# Patient Record
Sex: Male | Born: 1950 | Race: White | Hispanic: No | Marital: Single | Smoking: Never smoker
Health system: Southern US, Community
[De-identification: ages and names within clinical notes are randomized; demographics above are authoritative.]

## PROBLEM LIST (undated history)

## (undated) DIAGNOSIS — E042 Nontoxic multinodular goiter: Secondary | ICD-10-CM

## (undated) DIAGNOSIS — J302 Other seasonal allergic rhinitis: Secondary | ICD-10-CM

## (undated) DIAGNOSIS — E119 Type 2 diabetes mellitus without complications: Secondary | ICD-10-CM

## (undated) DIAGNOSIS — I499 Cardiac arrhythmia, unspecified: Secondary | ICD-10-CM

## (undated) DIAGNOSIS — M1990 Unspecified osteoarthritis, unspecified site: Secondary | ICD-10-CM

## (undated) DIAGNOSIS — I10 Essential (primary) hypertension: Secondary | ICD-10-CM

## (undated) DIAGNOSIS — Z87442 Personal history of urinary calculi: Secondary | ICD-10-CM

## (undated) DIAGNOSIS — G4730 Sleep apnea, unspecified: Secondary | ICD-10-CM

## (undated) DIAGNOSIS — K219 Gastro-esophageal reflux disease without esophagitis: Secondary | ICD-10-CM

## (undated) HISTORY — PX: OTHER SURGICAL HISTORY: SHX169

## (undated) HISTORY — DX: Other seasonal allergic rhinitis: J30.2

## (undated) HISTORY — DX: Sleep apnea, unspecified: G47.30

## (undated) HISTORY — DX: Type 2 diabetes mellitus without complications: E11.9

## (undated) HISTORY — PX: RHINOPHYMA RESECTION: SHX2353

---

## 2013-07-01 ENCOUNTER — Ambulatory Visit: Payer: Self-pay | Admitting: Internal Medicine

## 2014-01-20 DIAGNOSIS — J018 Other acute sinusitis: Secondary | ICD-10-CM | POA: Diagnosis not present

## 2014-01-20 DIAGNOSIS — L309 Dermatitis, unspecified: Secondary | ICD-10-CM | POA: Diagnosis not present

## 2014-01-20 DIAGNOSIS — Z23 Encounter for immunization: Secondary | ICD-10-CM | POA: Diagnosis not present

## 2014-01-20 DIAGNOSIS — R05 Cough: Secondary | ICD-10-CM | POA: Diagnosis not present

## 2014-02-01 ENCOUNTER — Telehealth: Payer: Self-pay | Admitting: Family Medicine

## 2014-02-01 NOTE — Telephone Encounter (Signed)
Pt given new pt appt with dr Livia Snellen 2/17 at 7:55, pt aware to arrive 15 minutes early, to bring insurance card and a list of all current medications.

## 2014-02-24 ENCOUNTER — Encounter: Payer: Self-pay | Admitting: Family Medicine

## 2014-02-24 ENCOUNTER — Ambulatory Visit (INDEPENDENT_AMBULATORY_CARE_PROVIDER_SITE_OTHER): Payer: Medicare Other | Admitting: Family Medicine

## 2014-02-24 ENCOUNTER — Encounter (INDEPENDENT_AMBULATORY_CARE_PROVIDER_SITE_OTHER): Payer: Self-pay

## 2014-02-24 VITALS — BP 147/89 | HR 68 | Temp 97.2°F | Ht 71.5 in | Wt 218.0 lb

## 2014-02-24 DIAGNOSIS — R5383 Other fatigue: Secondary | ICD-10-CM

## 2014-02-24 DIAGNOSIS — E119 Type 2 diabetes mellitus without complications: Secondary | ICD-10-CM | POA: Diagnosis not present

## 2014-02-24 DIAGNOSIS — J3081 Allergic rhinitis due to animal (cat) (dog) hair and dander: Secondary | ICD-10-CM | POA: Diagnosis not present

## 2014-02-24 DIAGNOSIS — M5432 Sciatica, left side: Secondary | ICD-10-CM

## 2014-02-24 DIAGNOSIS — G4733 Obstructive sleep apnea (adult) (pediatric): Secondary | ICD-10-CM | POA: Diagnosis not present

## 2014-02-24 LAB — POCT GLYCOSYLATED HEMOGLOBIN (HGB A1C)

## 2014-02-24 MED ORDER — BETAMETHASONE DIPROPIONATE AUG 0.05 % EX CREA: TOPICAL_CREAM | Freq: Two times a day (BID) | CUTANEOUS | Status: DC

## 2014-02-24 MED ORDER — GABAPENTIN 400 MG PO CAPS: 400.0000 mg | ORAL_CAPSULE | Freq: Three times a day (TID) | ORAL | Status: DC

## 2014-02-24 MED ORDER — DICLOFENAC SODIUM 75 MG PO TBEC: 75.0000 mg | DELAYED_RELEASE_TABLET | Freq: Two times a day (BID) | ORAL | Status: DC

## 2014-02-24 MED ORDER — SILDENAFIL CITRATE 20 MG PO TABS: 20.0000 mg | ORAL_TABLET | Freq: Every day | ORAL | Status: DC | PRN

## 2014-02-24 NOTE — Progress Notes (Signed)
Subjective:  Patient ID: Garrett Mcknight, male    DOB: [birth year]  Age: 64 y.o. MRN: [id number]  CC: Establish Care and Diabetes   HPI Garrett Mcknight presents for patients in today first visit since his move from [place] a little over year ago. He does still have family there and up until now has been seeing his doctor there. His record was reviewed before the visit showing his A1c to be 6.7. He does have a history of a left knee replacement and lumbar laminectomy. He uses CPAP for sleep apnea. He stated that he is has trouble sleeping because the CPAP leaks at times. He also has trouble sleeping due to the pain from his knees. With regard to the diabetes he tells me his blood sugar tends to run about 120-135 or so fasting in the morning however after meals later in the day it is a bit lower. 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 The sleeve reduces absorption of food and allowed him to lose over 75 pounds from the time it was placed the weight loss was over 5 years drinking 8 months of that he had the sleeve. The sleeve was removed due to complications [place] but through the study of hepatic infections.  History Garrett Mcknight has no past medical history on file.   He has no past surgical history on file.   His family history is not on file.He reports that he has never smoked. He does not have any smokeless tobacco history on file. He reports that he drinks alcohol. He reports that he does not use illicit drugs.  No current outpatient prescriptions on file prior to visit.   No current facility-administered medications on file prior to visit.    ROS Review of Systems  Constitutional: Negative for fever, chills, diaphoresis and unexpected weight change.  HENT: Negative for congestion, hearing loss, rhinorrhea, sore throat and trouble swallowing.   Respiratory:  Negative for cough, chest tightness, shortness of breath and wheezing.   Gastrointestinal: Negative for nausea, vomiting, abdominal pain, diarrhea, constipation and abdominal distention.  Endocrine: Negative for cold intolerance and heat intolerance.  Genitourinary: Negative for dysuria, hematuria and flank pain.  Musculoskeletal: Negative for joint swelling and arthralgias.  Skin: Negative for rash.  Neurological: Negative for dizziness and headaches.  Psychiatric/Behavioral: Negative for dysphoric mood, decreased concentration and agitation. The patient is not nervous/anxious.     Objective:  BP 147/89 mmHg  Pulse 68  Temp(Src) 97.2 F (36.2 C) (Oral)  Ht 5' 11.5" (1.816 m)  Wt 218 lb (98.884 kg)  BMI 29.98 kg/m2  Physical Exam  Constitutional: He is oriented to person, place, and time. He appears well-developed and well-nourished. No distress.  HENT:  Head: Normocephalic and atraumatic.  Right Ear: External ear normal.  Left Ear: External ear normal.  Nose: Nose normal.  Mouth/Throat: Oropharynx is clear and moist.  Eyes: Conjunctivae and EOM are normal. Pupils are equal, round, and reactive to light.  Neck: Normal range of motion. Neck supple. No thyromegaly present.  Cardiovascular: Normal rate, regular rhythm and normal heart sounds.   No murmur heard. Pulmonary/Chest: Effort normal and breath sounds normal. No respiratory distress. He has no wheezes. He has no rales.  Abdominal: Soft. Bowel sounds are normal. He exhibits no distension. There is no tenderness.  Lymphadenopathy:    He has no cervical adenopathy.  Neurological: He  is alert and oriented to person, place, and time. He has normal reflexes.  Skin: Skin is warm and dry.  Psychiatric: He has a normal mood and affect. His behavior is normal. Judgment and thought content normal.    Assessment & Plan:   Garrett Mcknight was seen today for establish care and diabetes.  Diagnoses and all orders for this visit:  Diabetes  mellitus type 2, controlled, without complications Orders: -     POCT glycosylated hemoglobin (Hb A1C) -     NMR, lipoprofile -     CMP14+EGFR  Other fatigue Orders: -     CBC with Differential/Platelet  Obstructive sleep apnea Orders: -     Ambulatory referral to Sleep Studies  Sciatica, left Orders: -     Ambulatory referral to Orthopedic Surgery  Allergic rhinitis due to animal hair and dander  Other orders -     diclofenac (VOLTAREN) 75 MG EC tablet; Take 1 tablet (75 mg total) by mouth 2 (two) times daily. -     sildenafil (REVATIO) 20 MG tablet; Take 1 tablet (20 mg total) by mouth daily as needed (2-5 as needed). -     gabapentin (NEURONTIN) 400 MG capsule; Take 1 capsule (400 mg total) by mouth 3 (three) times daily. -     augmented betamethasone dipropionate (DIPROLENE-AF) 0.05 % cream; Apply topically 2 (two) times daily.   I have changed Garrett Mcknight gabapentin. I am also having him start on diclofenac, sildenafil, and augmented betamethasone dipropionate. Additionally, I am having him maintain his metFORMIN, lisinopril, and fluticasone.  Meds ordered this encounter  Medications  . metFORMIN (GLUCOPHAGE) 1000 MG tablet    Sig: Take 1,000 mg by mouth 2 (two) times daily with a meal.  . lisinopril (PRINIVIL,ZESTRIL) 10 MG tablet    Sig: Take 10 mg by mouth daily.  Marland Kitchen DISCONTD: gabapentin (NEURONTIN) 300 MG capsule    Sig: Take 300 mg by mouth 3 (three) times daily.  . fluticasone (VERAMYST) 27.5 MCG/SPRAY nasal spray    Sig: Place 2 sprays into the nose daily.  . diclofenac (VOLTAREN) 75 MG EC tablet    Sig: Take 1 tablet (75 mg total) by mouth 2 (two) times daily.    Dispense:  60 tablet    Refill:  2  . sildenafil (REVATIO) 20 MG tablet    Sig: Take 1 tablet (20 mg total) by mouth daily as needed (2-5 as needed).    Dispense:  50 tablet    Refill:  5  . gabapentin (NEURONTIN) 400 MG capsule    Sig: Take 1 capsule (400 mg total) by mouth 3 (three) times  daily.    Dispense:  90 capsule    Refill:  2  . augmented betamethasone dipropionate (DIPROLENE-AF) 0.05 % cream    Sig: Apply topically 2 (two) times daily.    Dispense:  30 g    Refill:  0    Follow-up: Return in about 3 months (around 05/25/2014), or if symptoms worsen or fail to improve, for diabetes, CPE.  [name], M.D.

## 2014-02-25 LAB — NMR, LIPOPROFILE
CHOLESTEROL: 137 mg/dL (ref 100–199)
HDL Cholesterol by NMR: 51 mg/dL (ref >39)
HDL Particle Number: 32.6 umol/L (ref >=30.5)
LDL Particle Number: 666 nmol/L (ref <1000)
LDL SIZE: 21.3 nm (ref >20.5)
LDL-C: 77 mg/dL (ref 0–99)
LP-IR Score: 32 (ref <=45)
SMALL LDL PARTICLE NUMBER: 178 nmol/L (ref <=527)
TRIGLYCERIDES BY NMR: 47 mg/dL (ref 0–149)

## 2014-02-25 LAB — CMP14+EGFR
ALT: 15 IU/L (ref 0–44)
AST: 15 IU/L (ref 0–40)
Albumin/Globulin Ratio: 1.9 (ref 1.1–2.5)
Albumin: 4.4 g/dL (ref 3.6–4.8)
Alkaline Phosphatase: 58 IU/L (ref 39–117)
BUN/Creatinine Ratio: 25 — ABNORMAL HIGH (ref 10–22)
BUN: 17 mg/dL (ref 8–27)
Bilirubin Total: 0.5 mg/dL (ref 0.0–1.2)
CALCIUM: 9.8 mg/dL (ref 8.6–10.2)
CO2: 25 mmol/L (ref 18–29)
CREATININE: 0.68 mg/dL — AB (ref 0.76–1.27)
Chloride: 102 mmol/L (ref 97–108)
GFR calc Af Amer: 116 mL/min/{1.73_m2} (ref >59)
GFR, EST NON AFRICAN AMERICAN: 100 mL/min/{1.73_m2} (ref >59)
GLUCOSE: 131 mg/dL — AB (ref 65–99)
Globulin, Total: 2.3 g/dL (ref 1.5–4.5)
Potassium: 4.5 mmol/L (ref 3.5–5.2)
Sodium: 141 mmol/L (ref 134–144)
TOTAL PROTEIN: 6.7 g/dL (ref 6.0–8.5)

## 2014-03-05 ENCOUNTER — Other Ambulatory Visit: Payer: Self-pay | Admitting: *Deleted

## 2014-03-05 MED ORDER — METFORMIN HCL 1000 MG PO TABS: 1000.0000 mg | ORAL_TABLET | Freq: Two times a day (BID) | ORAL | Status: DC

## 2014-03-05 NOTE — Progress Notes (Signed)
Pt was to supposed to get Metformin refill at [place] sent into pharmacy per Dr Livia Snellen

## 2014-03-18 DIAGNOSIS — M1711 Unilateral primary osteoarthritis, right knee: Secondary | ICD-10-CM | POA: Diagnosis not present

## 2014-04-29 ENCOUNTER — Encounter: Payer: Self-pay | Admitting: Internal Medicine

## 2014-04-29 ENCOUNTER — Ambulatory Visit (INDEPENDENT_AMBULATORY_CARE_PROVIDER_SITE_OTHER): Payer: Medicare Other | Admitting: Internal Medicine

## 2014-04-29 VITALS — BP 124/70 | HR 66 | Ht 72.0 in | Wt 219.4 lb

## 2014-04-29 DIAGNOSIS — G4733 Obstructive sleep apnea (adult) (pediatric): Secondary | ICD-10-CM

## 2014-04-29 NOTE — Progress Notes (Signed)
04/29/14- 42 yoM never smoker  Referred courtesy of  Dr [name]- Pt uses CPAP 8 hrs nightly. C/o waking up with sinus congestion. Pt has loss weight and seen improvement with OSA. Prior NPSG in [place], [place].  Needs reassessment.  Original diagnosis made when he noted fatigue and night time sweats on awakening. Not used CPAP in months and says he no longer notes EDS.  Sleep is disturbed by knee and back pains. Pending knee replacement . Has dieted off 40 lbs since peak. Seasonal allergic rhinitis. Remote rhinoplasty for repeated nasal fxs.  Prior to Admission medications   Medication Sig Start Date End Date Taking? Authorizing Provider  fluticasone (VERAMYST) 27.5 MCG/SPRAY nasal spray Place 2 sprays into the nose daily.   Yes Historical Provider, MD  gabapentin (NEURONTIN) 400 MG capsule Take 1 capsule (400 mg total) by mouth 3 (three) times daily. 02/24/14  Yes [name], MD  lisinopril (PRINIVIL,ZESTRIL) 10 MG tablet Take 10 mg by mouth daily.   Yes Historical Provider, MD  metFORMIN (GLUCOPHAGE) 1000 MG tablet Take 1 tablet (1,000 mg total) by mouth 2 (two) times daily with a meal. 03/05/14  Yes [name], MD  diclofenac (VOLTAREN) 75 MG EC tablet Take 1 tablet (75 mg total) by mouth 2 (two) times daily. Patient not taking: Reported on 04/29/2014 02/24/14   [name], MD  sildenafil (REVATIO) 20 MG tablet Take 1 tablet (20 mg total) by mouth daily as needed (2-5 as needed). Patient not taking: Reported on 04/29/2014 02/24/14   [name], MD   Past Medical History  Diagnosis Date  . Sleep apnea   . Diabetes   . Seasonal allergies    Past Surgical History  Procedure Laterality Date  . L5 lumbar surgery    . Rhinophyma resection    . Right knee meniscus    . Right wrist reconstruction    . Left knee acl repaired    . Left knee replacement    . Left bicep tendoesis    . Left rotary cuff repair     ROS-see HPI   Negative unless "+" Constitutional:    weight loss,  night sweats, fevers, chills, fatigue, lassitude. HEENT:    headaches, difficulty swallowing, tooth/dental problems, sore throat,       sneezing, itching, ear ache, nasal congestion, post nasal drip, snoring CV:    chest pain, orthopnea, PND, swelling in lower extremities, anasarca,                                  dizziness, palpitations Resp:   shortness of breath with exertion or at rest.                productive cough,   non-productive cough, coughing up of blood.              change in color of mucus.  wheezing.   Skin:    rash or lesions. GI:  No-   heartburn, indigestion, abdominal pain, nausea, vomiting, diarrhea,                 change in bowel habits, loss of appetite GU: dysuria, change in color of urine, no urgency or frequency.   flank pain. MS:   +joint pain, stiffness, decreased range of motion, back pain. Neuro-     nothing unusual Psych:  change in mood or affect.  depression or anxiety.   memory loss.  OBJ- Physical Exam General- Alert, Oriented, Affect-appropriate, Distress- none acute Skin- rash-none, lesions- none, excoriation- none Lymphadenopathy- none Head- atraumatic            Eyes- Gross vision intact, PERRLA, conjunctivae and secretions clear            Ears- Hearing, canals-normal            Nose- Clear, no-Septal dev, mucus, polyps, erosion, perforation             Throat- Mallampati II-III , mucosa clear , drainage- none, tonsils- atrophic Neck- flexible , trachea midline, no stridor , thyroid nl, carotid no bruit Chest - symmetrical excursion , unlabored           Heart/CV- RRR , no murmur , no gallop  , no rub, nl s1 s2                           - JVD- none , edema- none, stasis changes- none, varices- none           Lung- clear to P&A, wheeze- none, cough- none , dullness-none, rub- none           Chest wall-  Abd-  Br/ Gen/ Rectal- Not done, not indicated Extrem- cyanosis- none, clubbing, none, atrophy- none, strength- nl Neuro- grossly intact to  observation

## 2014-04-29 NOTE — Patient Instructions (Addendum)
Order- Schedule split protocol NPSG    Dx OSA Educated sleep hygiene, management of rhinitis and insomnia related to knee pain.

## 2014-04-29 NOTE — Assessment & Plan Note (Addendum)
We are seeking result of original sleep study from [place]. Hard to tell if he still has OSA after weight loss. Areas of concern include his diabetes - OSA makes control worse, Plan- NPSG with discussion

## 2014-05-13 ENCOUNTER — Telehealth: Payer: Self-pay | Admitting: Family Medicine

## 2014-05-13 MED ORDER — PREGABALIN 150 MG PO CAPS: 150.0000 mg | ORAL_CAPSULE | Freq: Two times a day (BID) | ORAL | Status: DC

## 2014-05-13 NOTE — Telephone Encounter (Signed)
Pt aware new prescription has been sent to pharmacy.  Pt had another question about his medications. Should he stay on lisinopril while taking the sildenefil, If he is to continue taking both he needs a 90d supply sent to the [place]

## 2014-05-13 NOTE — Telephone Encounter (Signed)
Yes, please send both with 6 month refills

## 2014-05-13 NOTE — Telephone Encounter (Signed)
Pt was at front desk wanting to talk with nurse, he told the front office that he is concerned with his weight gain on gabapentin and is wanting to know about changing it

## 2014-05-13 NOTE — Telephone Encounter (Signed)
He should be able to make a direct switch over to Lyrica 150 twice a day without tapering or titrating. I printed a prescription. It is much less likely to cause weight gain.

## 2014-05-14 MED ORDER — SILDENAFIL CITRATE 20 MG PO TABS: 20.0000 mg | ORAL_TABLET | Freq: Every day | ORAL | Status: DC | PRN

## 2014-05-14 MED ORDER — LISINOPRIL 10 MG PO TABS: 10.0000 mg | ORAL_TABLET | Freq: Every day | ORAL | Status: DC

## 2014-05-14 NOTE — Telephone Encounter (Signed)
Meds sent in

## 2014-05-18 ENCOUNTER — Telehealth: Payer: Self-pay

## 2014-05-18 NOTE — Telephone Encounter (Signed)
Dr Livia Snellen I am having to prior authorize Sildenafil for this patient  Looking in your notes I do not see a Diagnosis for this medicaiton   Please advise?

## 2014-05-18 NOTE — Telephone Encounter (Signed)
Use erectile dysfunction.

## 2014-05-20 ENCOUNTER — Telehealth: Payer: Self-pay

## 2014-05-20 NOTE — Telephone Encounter (Signed)
Dr Livia Snellen  Having to get Sildenafil authorized and you gave me the DX of Erectile Dysfunction but I'm not understanding the amount of #90 take one daily as needed?  Is he just to use during relations and if so #90 is too many to write for to get approved.   Let me know

## 2014-05-20 NOTE — Progress Notes (Signed)
Surgery on 06/01/2014.  preop on 05/26/14 at 200pm.  Need orders in EPIC.  Thank You.

## 2014-05-21 ENCOUNTER — Other Ambulatory Visit: Payer: Self-pay | Admitting: Surgical

## 2014-05-21 MED ORDER — SILDENAFIL CITRATE 20 MG PO TABS: ORAL_TABLET | ORAL | Status: DC

## 2014-05-21 NOTE — Progress Notes (Signed)
Surgery on 06/01/2014.  Preop on 5/18 at 200pm.  Need ordersin EPIC Thank You.

## 2014-05-21 NOTE — Telephone Encounter (Signed)
I sent a new prescription to that should be more specific thanks.

## 2014-05-25 ENCOUNTER — Other Ambulatory Visit (HOSPITAL_COMMUNITY): Payer: Self-pay | Admitting: Orthopedic Surgery

## 2014-05-25 NOTE — Patient Instructions (Addendum)
Garrett Mcknight  05/25/2014   Your procedure is scheduled on: Tuesday 06/01/2014  Report to [facility] Main  Entrance and follow signs to               [place] at  1015 AM.  Call this number if you have problems the morning of surgery [phone]   Remember: ONLY 1 PERSON MAY GO WITH YOU TO SHORT STAY TO GET  READY MORNING OF [place].  Do not eat food or drink liquids :After Midnight.     Take these medicines the morning of surgery with A SIP OF WATER:  use Veramyst nasal spray                               You may not have any metal on your body including hair pins and              piercings  Do not wear jewelry, make-up, lotions, powders or perfumes, deodorant             Do not wear nail polish.  Do not shave  48 hours prior to surgery.              Men may shave face and neck.   Do not bring valuables to the hospital. [place].  Contacts, dentures or bridgework may not be worn into surgery.  Leave suitcase in the car. After surgery it may be brought to your room.     Patients discharged the day of surgery will not be allowed to drive home.  Name and phone number of your driver:  Special Instructions: N/A              Please read over the following fact sheets you were given: _____________________________________________________________________             [facility] - Preparing for Surgery Before surgery, you can play an important role.  Because skin is not sterile, your skin needs to be as free of germs as possible.  You can reduce the number of germs on your skin by washing with CHG (chlorahexidine gluconate) soap before surgery.  CHG is an antiseptic cleaner which kills germs and bonds with the skin to continue killing germs even after washing. Please DO NOT use if you have an allergy to CHG or antibacterial soaps.  If your skin becomes reddened/irritated stop using the CHG and  inform your nurse when you arrive at Short Stay. Do not shave (including legs and underarms) for at least 48 hours prior to the first CHG shower.  You may shave your face/neck. Please follow these instructions carefully:  1.  Shower with CHG Soap the night before surgery and the  morning of Surgery.  2.  If you choose to wash your hair, wash your hair first as usual with your  normal  shampoo.  3.  After you shampoo, rinse your hair and body thoroughly to remove the  shampoo.                           4.  Use CHG as you would any other liquid soap.  You can apply chg directly  to the skin and wash  Gently with a scrungie or clean washcloth.  5.  Apply the CHG Soap to your body ONLY FROM THE NECK DOWN.   Do not use on face/ open                           Wound or open sores. Avoid contact with eyes, ears mouth and genitals (private parts).                       Wash face,  Genitals (private parts) with your normal soap.             6.  Wash thoroughly, paying special attention to the area where your surgery  will be performed.  7.  Thoroughly rinse your body with warm water from the neck down.  8.  DO NOT shower/wash with your normal soap after using and rinsing off  the CHG Soap.                9.  Pat yourself dry with a clean towel.            10.  Wear clean pajamas.            11.  Place clean sheets on your bed the night of your first shower and do not  sleep with pets. Day of Surgery : Do not apply any lotions/deodorants the morning of surgery.  Please wear clean clothes to the hospital/surgery center.  FAILURE TO FOLLOW THESE INSTRUCTIONS MAY RESULT IN THE CANCELLATION OF YOUR SURGERY PATIENT SIGNATURE_________________________________  NURSE SIGNATURE__________________________________  ________________________________________________________________________   Garrett Mcknight  An incentive spirometer is a tool that can help keep your lungs clear and  active. This tool measures how well you are filling your lungs with each breath. Taking long deep breaths may help reverse or decrease the chance of developing breathing (pulmonary) problems (especially infection) following:  A long period of time when you are unable to move or be active. BEFORE THE PROCEDURE   If the spirometer includes an indicator to show your best effort, your nurse or respiratory therapist will set it to a desired goal.  If possible, sit up straight or lean slightly forward. Try not to slouch.  Hold the incentive spirometer in an upright position. INSTRUCTIONS FOR USE   Sit on the edge of your bed if possible, or sit up as far as you can in bed or on a chair.  Hold the incentive spirometer in an upright position.  Breathe out normally.  Place the mouthpiece in your mouth and seal your lips tightly around it.  Breathe in slowly and as deeply as possible, raising the piston or the ball toward the top of the column.  Hold your breath for 3-5 seconds or for as long as possible. Allow the piston or ball to fall to the bottom of the column.  Remove the mouthpiece from your mouth and breathe out normally.  Rest for a few seconds and repeat Steps 1 through 7 at least 10 times every 1-2 hours when you are awake. Take your time and take a few normal breaths between deep breaths.  The spirometer may include an indicator to show your best effort. Use the indicator as a goal to work toward during each repetition.  After each set of 10 deep breaths, practice coughing to be sure your lungs are clear. If you have an incision (the cut made at the time of surgery),  support your incision when coughing by placing a pillow or rolled up towels firmly against it. Once you are able to get out of bed, walk around indoors and cough well. You may stop using the incentive spirometer when instructed by your caregiver.  RISKS AND COMPLICATIONS  Take your time so you do not get dizzy or  light-headed.  If you are in pain, you may need to take or ask for pain medication before doing incentive spirometry. It is harder to take a deep breath if you are having pain. AFTER USE  Rest and breathe slowly and easily.  It can be helpful to keep track of a log of your progress. Your caregiver can provide you with a simple table to help with this. If you are using the spirometer at home, follow these instructions: [place] IF:   You are having difficultly using the spirometer.  You have trouble using the spirometer as often as instructed.  Your pain medication is not giving enough relief while using the spirometer.  You develop fever of 100.5 F (38.1 C) or higher. SEEK IMMEDIATE MEDICAL CARE IF:   You cough up bloody sputum that had not been present before.  You develop fever of 102 F (38.9 C) or greater.  You develop worsening pain at or near the incision site. MAKE SURE YOU:   Understand these instructions.  Will watch your condition.  Will get help right away if you are not doing well or get worse. Document Released: 05/07/2006 Document Revised: 03/19/2011 Document Reviewed: 07/08/2006 Exit[employer] Patient Information 2014 [employer], [place].   ________________________________________________________________________  WHAT IS A BLOOD TRANSFUSION? Blood Transfusion Information  A transfusion is the replacement of blood or some of its parts. Blood is made up of multiple cells which provide different functions.  Red blood cells carry oxygen and are used for blood loss replacement.  White blood cells fight against infection.  Platelets control bleeding.  Plasma helps clot blood.  Other blood products are available for specialized needs, such as hemophilia or other clotting disorders. BEFORE THE TRANSFUSION  Who gives blood for transfusions?   Healthy volunteers who are fully evaluated to make sure their blood is safe. This is blood bank  blood. Transfusion therapy is the safest it has ever been in the practice of medicine. Before blood is taken from a donor, a complete history is taken to make sure that person has no history of diseases nor engages in risky social behavior (examples are intravenous drug use or sexual activity with multiple partners). The donor's travel history is screened to minimize risk of transmitting infections, such as malaria. The donated blood is tested for signs of infectious diseases, such as HIV and hepatitis. The blood is then tested to be sure it is compatible with you in order to minimize the chance of a transfusion reaction. If you or a relative donates blood, this is often done in anticipation of surgery and is not appropriate for emergency situations. It takes many days to process the donated blood. RISKS AND COMPLICATIONS Although transfusion therapy is very safe and saves many lives, the main dangers of transfusion include:   Getting an infectious disease.  Developing a transfusion reaction. This is an allergic reaction to something in the blood you were given. Every precaution is taken to prevent this. The decision to have a blood transfusion has been considered carefully by your caregiver before blood is given. Blood is not given unless the benefits outweigh the risks. AFTER THE TRANSFUSION  Right after receiving a blood transfusion, you will usually feel much better and more energetic. This is especially true if your red blood cells have gotten low (anemic). The transfusion raises the level of the red blood cells which carry oxygen, and this usually causes an energy increase.  The nurse administering the transfusion will monitor you carefully for complications. HOME CARE INSTRUCTIONS  No special instructions are needed after a transfusion. You may find your energy is better. Speak with your caregiver about any limitations on activity for underlying diseases you may have. SEEK MEDICAL CARE IF:    Your condition is not improving after your transfusion.  You develop redness or irritation at the intravenous (IV) site. SEEK IMMEDIATE MEDICAL CARE IF:  Any of the following symptoms occur over the next 12 hours:  Shaking chills.  You have a temperature by mouth above 102 F (38.9 C), not controlled by medicine.  Chest, back, or muscle pain.  People around you feel you are not acting correctly or are confused.  Shortness of breath or difficulty breathing.  Dizziness and fainting.  You get a rash or develop hives.  You have a decrease in urine output.  Your urine turns a dark color or changes to pink, red, or brown. Any of the following symptoms occur over the next 10 days:  You have a temperature by mouth above 102 F (38.9 C), not controlled by medicine.  Shortness of breath.  Weakness after normal activity.  The white part of the eye turns yellow (jaundice).  You have a decrease in the amount of urine or are urinating less often.  Your urine turns a dark color or changes to pink, red, or brown. Document Released: 12/23/1999 Document Revised: 03/19/2011 Document Reviewed: 08/11/2007 [facility] Patient Information 2014 [place], [place].  _______________________________________________________________________

## 2014-05-26 ENCOUNTER — Encounter (HOSPITAL_COMMUNITY)
Admission: RE | Admit: 2014-05-26 | Discharge: 2014-05-26 | Disposition: A | Discharge status: Home / Self Care | Payer: Medicare Other | Source: Ambulatory Visit | Attending: Orthopedic Surgery | Admitting: Orthopedic Surgery

## 2014-05-26 ENCOUNTER — Ambulatory Visit (HOSPITAL_COMMUNITY)
Admission: RE | Admit: 2014-05-26 | Discharge: 2014-05-26 | Disposition: A | Discharge status: Home / Self Care | Payer: Medicare Other | Source: Ambulatory Visit | Attending: Surgical | Admitting: Surgical

## 2014-05-26 ENCOUNTER — Telehealth: Payer: Self-pay

## 2014-05-26 ENCOUNTER — Encounter (HOSPITAL_COMMUNITY): Payer: Self-pay

## 2014-05-26 ENCOUNTER — Telehealth: Payer: Self-pay | Admitting: Internal Medicine

## 2014-05-26 DIAGNOSIS — M179 Osteoarthritis of knee, unspecified: Secondary | ICD-10-CM | POA: Diagnosis not present

## 2014-05-26 DIAGNOSIS — Z833 Family history of diabetes mellitus: Secondary | ICD-10-CM | POA: Diagnosis not present

## 2014-05-26 DIAGNOSIS — Z01818 Encounter for other preprocedural examination: Secondary | ICD-10-CM | POA: Diagnosis not present

## 2014-05-26 DIAGNOSIS — M25561 Pain in right knee: Secondary | ICD-10-CM | POA: Diagnosis not present

## 2014-05-26 DIAGNOSIS — Z01810 Encounter for preprocedural cardiovascular examination: Secondary | ICD-10-CM | POA: Insufficient documentation

## 2014-05-26 DIAGNOSIS — Z01812 Encounter for preprocedural laboratory examination: Secondary | ICD-10-CM | POA: Diagnosis not present

## 2014-05-26 DIAGNOSIS — M1711 Unilateral primary osteoarthritis, right knee: Secondary | ICD-10-CM | POA: Diagnosis present

## 2014-05-26 DIAGNOSIS — K219 Gastro-esophageal reflux disease without esophagitis: Secondary | ICD-10-CM | POA: Insufficient documentation

## 2014-05-26 DIAGNOSIS — Z79899 Other long term (current) drug therapy: Secondary | ICD-10-CM | POA: Diagnosis not present

## 2014-05-26 DIAGNOSIS — I10 Essential (primary) hypertension: Secondary | ICD-10-CM | POA: Diagnosis present

## 2014-05-26 DIAGNOSIS — G4733 Obstructive sleep apnea (adult) (pediatric): Secondary | ICD-10-CM

## 2014-05-26 DIAGNOSIS — E119 Type 2 diabetes mellitus without complications: Secondary | ICD-10-CM | POA: Diagnosis present

## 2014-05-26 DIAGNOSIS — M24561 Contracture, right knee: Secondary | ICD-10-CM | POA: Diagnosis not present

## 2014-05-26 DIAGNOSIS — S60812A Abrasion of left wrist, initial encounter: Secondary | ICD-10-CM | POA: Diagnosis not present

## 2014-05-26 DIAGNOSIS — Z96652 Presence of left artificial knee joint: Secondary | ICD-10-CM | POA: Diagnosis present

## 2014-05-26 HISTORY — DX: Gastro-esophageal reflux disease without esophagitis: K21.9

## 2014-05-26 HISTORY — DX: Essential (primary) hypertension: I10

## 2014-05-26 HISTORY — DX: Unspecified osteoarthritis, unspecified site: M19.90

## 2014-05-26 LAB — CBC WITH DIFFERENTIAL/PLATELET
Basophils Absolute: 0 10*3/uL (ref 0.0–0.1)
Basophils Relative: 0 % (ref 0–1)
Eosinophils Absolute: 0.2 10*3/uL (ref 0.0–0.7)
Eosinophils Relative: 3 % (ref 0–5)
HCT: 44.4 % (ref 39.0–52.0)
Hemoglobin: 15.1 g/dL (ref 13.0–17.0)
Lymphocytes Relative: 26 % (ref 12–46)
Lymphs Abs: 1.9 10*3/uL (ref 0.7–4.0)
MCH: 29.1 pg (ref 26.0–34.0)
MCHC: 34 g/dL (ref 30.0–36.0)
MCV: 85.5 fL (ref 78.0–100.0)
Monocytes Absolute: 0.6 10*3/uL (ref 0.1–1.0)
Monocytes Relative: 7 % (ref 3–12)
Neutro Abs: 4.8 10*3/uL (ref 1.7–7.7)
Neutrophils Relative %: 64 % (ref 43–77)
Platelets: 227 10*3/uL (ref 150–400)
RBC: 5.19 MIL/uL (ref 4.22–5.81)
RDW: 13.1 % (ref 11.5–15.5)
WBC: 7.6 10*3/uL (ref 4.0–10.5)

## 2014-05-26 LAB — COMPREHENSIVE METABOLIC PANEL
ALT: 21 U/L (ref 17–63)
AST: 30 U/L (ref 15–41)
Albumin: 4.5 g/dL (ref 3.5–5.0)
Alkaline Phosphatase: 55 U/L (ref 38–126)
Anion gap: 8 (ref 5–15)
BUN: 17 mg/dL (ref 6–20)
CO2: 28 mmol/L (ref 22–32)
Calcium: 9.6 mg/dL (ref 8.9–10.3)
Chloride: 106 mmol/L (ref 101–111)
Creatinine, Ser: 0.7 mg/dL (ref 0.61–1.24)
GFR calc Af Amer: >60 mL/min (ref >60)
GFR calc non Af Amer: >60 mL/min (ref >60)
Glucose, Bld: 109 mg/dL — ABNORMAL HIGH (ref 65–99)
Potassium: 4.5 mmol/L (ref 3.5–5.1)
Sodium: 142 mmol/L (ref 135–145)
Total Bilirubin: 0.8 mg/dL (ref 0.3–1.2)
Total Protein: 7.6 g/dL (ref 6.5–8.1)

## 2014-05-26 LAB — URINALYSIS, ROUTINE W REFLEX MICROSCOPIC
Bilirubin Urine: NEGATIVE
Glucose, UA: NEGATIVE mg/dL
Hgb urine dipstick: NEGATIVE
Ketones, ur: NEGATIVE mg/dL
Leukocytes, UA: NEGATIVE
Nitrite: NEGATIVE
Protein, ur: NEGATIVE mg/dL
Specific Gravity, Urine: 1.026 (ref 1.005–1.030)
Urobilinogen, UA: 0.2 mg/dL (ref 0.0–1.0)
pH: 5.5 (ref 5.0–8.0)

## 2014-05-26 LAB — APTT: APTT: 23 s — AB (ref 24–37)

## 2014-05-26 LAB — SURGICAL PCR SCREEN
MRSA, PCR: NEGATIVE
STAPHYLOCOCCUS AUREUS: NEGATIVE

## 2014-05-26 LAB — PROTIME-INR
INR: 1.14 (ref 0.00–1.49)
Prothrombin Time: 14.8 seconds (ref 11.6–15.2)

## 2014-05-26 NOTE — Telephone Encounter (Signed)
Ok to set up new DME for established patient with OSA. Send script for mask of choice with supplies.

## 2014-05-26 NOTE — Telephone Encounter (Signed)
Insurance denied prior authorization of Sildenafil  Needs to be used for a medically accepted indication- Pulmonary Hypertension

## 2014-05-26 NOTE — Telephone Encounter (Signed)
Spoke with pt, states he is having knee replacement sx next Tuesday so he needs to reschedule sleep study.  Called sleep lab, they will be contacting patient this week to reschedule sleep study.  Pt also requesting a new cpap mask.  Pt is not currently affiliated with a DME company- bought his cpap outright.  Is requesting that we send an order to establish him with a dme company of CY's choice.     Dr. Annamaria Boots please advise on which DME company you prefer, and if you're ok with ordering cpap mask.  Thanks!

## 2014-05-27 LAB — ABO/RH: ABO/RH(D): O POS

## 2014-05-27 NOTE — Telephone Encounter (Signed)
Spoke with pt and advised that order was placed for new dme to provide mask of choice and supplies.

## 2014-05-27 NOTE — H&P (Signed)
TOTAL KNEE ADMISSION H&P  Patient is being admitted for right total knee arthroplasty.  Subjective:  Chief Complaint:right knee pain.  HPI: Garrett Mcknight, 64 y.o. male, has a history of pain and functional disability in the right knee due to arthritis and has failed non-surgical conservative treatments for greater than 12 weeks to includeNSAID's and/or analgesics, corticosteriod injections and activity modification.  Onset of symptoms was gradual, starting >10 years ago with gradually worsening course since that time. The patient noted prior procedures on the knee to include  arthroscopy and menisectomy on the right knee(s).  Patient currently rates pain in the right knee(s) at 7 out of 10 with activity. Patient has night pain, worsening of pain with activity and weight bearing, pain that interferes with activities of daily living, pain with passive range of motion, crepitus and joint swelling.  Patient has evidence of periarticular osteophytes and joint space narrowing by imaging studies.  There is no active infection.  Patient Active Problem List   Diagnosis Date Noted  . Obstructive sleep apnea 04/29/2014   Past Medical History  Diagnosis Date  . Diabetes   . Seasonal allergies   . Sleep apnea     uses CPAP  . Arthritis   . GERD (gastroesophageal reflux disease)   . Hypertension     Past Surgical History  Procedure Laterality Date  . L5 lumbar surgery    . Rhinophyma resection    . Right knee meniscus    . Right wrist reconstruction    . Left knee acl repaired    . Left knee replacement    . Left bicep tendoesis    . Left rotary cuff repair    . Fibroma      removed from scalp 35 years ago     Current outpatient prescriptions:  .  Ascorbic Acid (VITAMIN C PO), Take 1 tablet by mouth every morning., Disp: , Rfl:  .  Cholecalciferol (VITAMIN D PO), Take 1 tablet by mouth every morning., Disp: , Rfl:  .  fexofenadine (ALLEGRA) 180 MG tablet, Take 180 mg by mouth every  morning., Disp: , Rfl:  .  fluticasone (VERAMYST) 27.5 MCG/SPRAY nasal spray, Place 1 spray into the nose 2 (two) times daily. , Disp: , Rfl:  .  lisinopril (PRINIVIL,ZESTRIL) 10 MG tablet, Take 1 tablet (10 mg total) by mouth daily., Disp: 90 tablet, Rfl: 1 .  metFORMIN (GLUCOPHAGE) 1000 MG tablet, Take 1 tablet (1,000 mg total) by mouth 2 (two) times daily with a meal., Disp: 180 tablet, Rfl: 1 .  Multiple Vitamin (MULTIVITAMIN WITH MINERALS) TABS tablet, Take 1 tablet by mouth every morning., Disp: , Rfl:  .  naproxen (NAPROSYN) 250 MG tablet, Take 250-500 mg by mouth 2 (two) times daily as needed for moderate pain., Disp: , Rfl:  .  pregabalin (LYRICA) 150 MG capsule, Take 1 capsule (150 mg total) by mouth 2 (two) times daily. (Patient taking differently: Take 150 mg by mouth 2 (two) times daily. Only takes in evenings), Disp: 60 capsule, Rfl: 2 .  Tetrahydrozoline HCl (VISINE OP), Apply 1-2 drops to eye daily as needed (dry eyes.)., Disp: , Rfl:  .  sildenafil (REVATIO) 20 MG tablet, Take 2-5 as needed for sexual activity, Disp: 50 tablet, Rfl: 5  Allergies  Allergen Reactions  . Dilaudid [Hydromorphone Hcl]     Dry heaves-with IV form  . Duloxetine Hcl Other (See Comments)    Urinary retention    History  Substance Use Topics  .  Smoking status: Never Smoker   . Smokeless tobacco: Not on file  . Alcohol Use: 0.0 oz/week    0 Standard drinks or equivalent per week     Comment: rare    Family History  Problem Relation Age of Onset  . Breast cancer Sister   . Diabetes Maternal Grandmother   . Diabetes Mellitus II Father   . Stroke Maternal Grandfather      Review of Systems  Constitutional: Negative.   HENT: Negative.   Eyes: Negative.   Respiratory: Negative.   Cardiovascular: Negative.   Gastrointestinal: Negative.   Genitourinary: Negative.   Musculoskeletal: Positive for back pain and joint pain. Negative for myalgias, falls and neck pain.       Right knee pain  Skin:  Negative.   Neurological: Positive for dizziness. Negative for tingling, tremors, sensory change, speech change, focal weakness, seizures and loss of consciousness.  Endo/Heme/Allergies: Negative.   Psychiatric/Behavioral: Negative for depression, suicidal ideas, hallucinations, memory loss and substance abuse. The patient has insomnia. The patient is not nervous/anxious.     Objective:  Physical Exam  Constitutional: He is oriented to person, place, and time. He appears well-developed and well-nourished. No distress.  HENT:  Head: Normocephalic and atraumatic.  Right Ear: External ear normal.  Left Ear: External ear normal.  Nose: Nose normal.  Mouth/Throat: Oropharynx is clear and moist.  Eyes: Conjunctivae and EOM are normal.  Neck: Normal range of motion. Neck supple.  Cardiovascular: Normal rate, regular rhythm, normal heart sounds and intact distal pulses.   No murmur heard. Respiratory: Effort normal and breath sounds normal. No respiratory distress. He has no wheezes.  GI: Soft. Bowel sounds are normal. He exhibits no distension. There is no tenderness.  Musculoskeletal:       Right hip: Normal.       Left hip: Normal.       Right knee: He exhibits decreased range of motion and swelling. He exhibits no effusion and no erythema. Tenderness found. Medial joint line tenderness noted.       Left knee: Normal.  Neurological: He is alert and oriented to person, place, and time. He has normal strength and normal reflexes. No sensory deficit.  Skin: No rash noted. He is not diaphoretic. No erythema.  Psychiatric: He has a normal mood and affect. His behavior is normal.    Vital signs in last 24 hours: Temp:  [98.2 F (36.8 C)] 98.2 F (36.8 C) (05/18 1430) Pulse Rate:  [70] 70 (05/18 1430) Resp:  [16] 16 (05/18 1430) BP: (140)/(75) 140/75 mmHg (05/18 1430) SpO2:  [97 %] 97 % (05/18 1430) Weight:  [99.156 kg (218 lb 9.6 oz)] 99.156 kg (218 lb 9.6 oz) (05/18 1430)    Imaging  Review Plain radiographs demonstrate severe degenerative joint disease of the right knee(s). The overall alignment ismild varus. The bone quality appears to be good for age and reported activity level.  Assessment/Plan:  End stage arthritis, right knee   The patient history, physical examination, clinical judgment of the provider and imaging studies are consistent with end stage degenerative joint disease of the right knee(s) and total knee arthroplasty is deemed medically necessary. The treatment options including medical management, injection therapy arthroscopy and arthroplasty were discussed at length. The risks and benefits of total knee arthroplasty were presented and reviewed. The risks due to aseptic loosening, infection, stiffness, patella tracking problems, thromboembolic complications and other imponderables were discussed. The patient acknowledged the explanation, agreed to proceed with the  plan and consent was signed. Patient is being admitted for inpatient treatment for surgery, pain control, PT, OT, prophylactic antibiotics, VTE prophylaxis, progressive ambulation and ADL's and discharge planning. The patient is planning to be discharged home with home health services   PCP: Dr. [name]  Topical TXA    Ardeen Jourdain, PA-C

## 2014-05-31 NOTE — Anesthesia Preprocedure Evaluation (Addendum)
Anesthesia Evaluation  Patient identified by MRN, date of birth, ID band Patient awake    Reviewed: Allergy & Precautions, NPO status , Patient's Chart, lab work & pertinent test results, reviewed documented beta blocker date and time   Airway Mallampati: II   Neck ROM: Full    Dental  (+) Teeth Intact, Dental Advisory Given   Pulmonary sleep apnea and Continuous Positive Airway Pressure Ventilation ,  breath sounds clear to auscultation        Cardiovascular hypertension, Pt. on medications Rhythm:Regular  EKG 05/2014 WNL   Neuro/Psych    GI/Hepatic Neg liver ROS, GERD-  Medicated,  Endo/Other  diabetes, Oral Hypoglycemic Agents  Renal/GU negative Renal ROS     Musculoskeletal   Abdominal (+)  Abdomen: soft.    Peds  Hematology   Anesthesia Other Findings   Reproductive/Obstetrics                            Anesthesia Physical Anesthesia Plan  ASA: III  Anesthesia Plan: General   Post-op Pain Management:    Induction: Intravenous  Airway Management Planned: Oral ETT  Additional Equipment:   Intra-op Plan:   Post-operative Plan:   Informed Consent: I have reviewed the patients History and Physical, chart, labs and discussed the procedure including the risks, benefits and alternatives for the proposed anesthesia with the patient or authorized representative who has indicated his/her understanding and acceptance.     Plan Discussed with:   Anesthesia Plan Comments: (GA requested, will offer Spinal and or block with GA)        Anesthesia Quick Evaluation

## 2014-06-01 ENCOUNTER — Inpatient Hospital Stay (HOSPITAL_COMMUNITY): Payer: Medicare Other | Admitting: Anesthesiology

## 2014-06-01 ENCOUNTER — Encounter (HOSPITAL_COMMUNITY): Payer: Self-pay | Admitting: *Deleted

## 2014-06-01 ENCOUNTER — Encounter (HOSPITAL_COMMUNITY)
Admission: RE | Disposition: A | Discharge status: Home Health | Payer: Self-pay | Source: Ambulatory Visit | Attending: Orthopedic Surgery

## 2014-06-01 ENCOUNTER — Inpatient Hospital Stay (HOSPITAL_COMMUNITY)
Admission: RE | Admit: 2014-06-01 | Discharge: 2014-06-03 | DRG: 470 | Disposition: A | Discharge status: Home Health | Payer: Medicare Other | Source: Ambulatory Visit | Attending: Orthopedic Surgery | Admitting: Orthopedic Surgery

## 2014-06-01 DIAGNOSIS — M24561 Contracture, right knee: Secondary | ICD-10-CM | POA: Diagnosis present

## 2014-06-01 DIAGNOSIS — M25561 Pain in right knee: Secondary | ICD-10-CM | POA: Diagnosis not present

## 2014-06-01 DIAGNOSIS — E119 Type 2 diabetes mellitus without complications: Secondary | ICD-10-CM | POA: Diagnosis present

## 2014-06-01 DIAGNOSIS — Z833 Family history of diabetes mellitus: Secondary | ICD-10-CM

## 2014-06-01 DIAGNOSIS — Z96652 Presence of left artificial knee joint: Secondary | ICD-10-CM | POA: Diagnosis present

## 2014-06-01 DIAGNOSIS — Z96659 Presence of unspecified artificial knee joint: Secondary | ICD-10-CM

## 2014-06-01 DIAGNOSIS — K219 Gastro-esophageal reflux disease without esophagitis: Secondary | ICD-10-CM | POA: Diagnosis not present

## 2014-06-01 DIAGNOSIS — G4733 Obstructive sleep apnea (adult) (pediatric): Secondary | ICD-10-CM | POA: Diagnosis not present

## 2014-06-01 DIAGNOSIS — Z79899 Other long term (current) drug therapy: Secondary | ICD-10-CM

## 2014-06-01 DIAGNOSIS — S60812A Abrasion of left wrist, initial encounter: Secondary | ICD-10-CM | POA: Diagnosis not present

## 2014-06-01 DIAGNOSIS — I10 Essential (primary) hypertension: Secondary | ICD-10-CM | POA: Diagnosis present

## 2014-06-01 DIAGNOSIS — M1711 Unilateral primary osteoarthritis, right knee: Secondary | ICD-10-CM | POA: Diagnosis not present

## 2014-06-01 DIAGNOSIS — M179 Osteoarthritis of knee, unspecified: Secondary | ICD-10-CM | POA: Diagnosis not present

## 2014-06-01 HISTORY — PX: TOTAL KNEE ARTHROPLASTY: SHX125

## 2014-06-01 LAB — GLUCOSE, CAPILLARY
GLUCOSE-CAPILLARY: 175 mg/dL — AB (ref 65–99)
Glucose-Capillary: 121 mg/dL — ABNORMAL HIGH (ref 65–99)
Glucose-Capillary: 261 mg/dL — ABNORMAL HIGH (ref 65–99)
Glucose-Capillary: 171 mg/dL — ABNORMAL HIGH (ref 65–99)
Glucose-Capillary: 159 mg/dL — ABNORMAL HIGH (ref 65–99)

## 2014-06-01 LAB — TYPE AND SCREEN
ABO/RH(D): O POS
Antibody Screen: NEGATIVE

## 2014-06-01 SURGERY — ARTHROPLASTY, KNEE, TOTAL
Anesthesia: General | Site: Knee | Laterality: Right

## 2014-06-01 MED ORDER — PROMETHAZINE HCL 25 MG/ML IJ SOLN: 6.2500 mg | INTRAMUSCULAR | Status: DC | PRN

## 2014-06-01 MED ORDER — ALUM & MAG HYDROXIDE-SIMETH 200-200-20 MG/5ML PO SUSP: 30.0000 mL | ORAL | Status: DC | PRN

## 2014-06-01 MED ORDER — GLYCOPYRROLATE 0.2 MG/ML IJ SOLN
INTRAMUSCULAR | Status: DC | PRN
  Administered 2014-06-01: 0.4 mg via INTRAVENOUS

## 2014-06-01 MED ORDER — METFORMIN HCL 500 MG PO TABS
1000.0000 mg | ORAL_TABLET | Freq: Two times a day (BID) | ORAL | Status: DC
  Administered 2014-06-02 – 2014-06-03 (×3): 1000 mg via ORAL
  Filled 2014-06-01 (×6): qty 2

## 2014-06-01 MED ORDER — STERILE WATER FOR INJECTION IJ SOLN
INTRAMUSCULAR | Status: AC
  Filled 2014-06-01: qty 10

## 2014-06-01 MED ORDER — CEFAZOLIN SODIUM-DEXTROSE 2-3 GM-% IV SOLR
2.0000 g | INTRAVENOUS | Status: AC
  Administered 2014-06-01: 2 g via INTRAVENOUS

## 2014-06-01 MED ORDER — LACTATED RINGERS IV SOLN
INTRAVENOUS | Status: DC | PRN
  Administered 2014-06-01 (×2): via INTRAVENOUS

## 2014-06-01 MED ORDER — FLUTICASONE PROPIONATE 50 MCG/ACT NA SUSP
1.0000 | Freq: Two times a day (BID) | NASAL | Status: DC
  Administered 2014-06-01 – 2014-06-03 (×3): 1 via NASAL
  Filled 2014-06-01: qty 16

## 2014-06-01 MED ORDER — SODIUM CHLORIDE 0.9 % IR SOLN
Status: DC | PRN
  Administered 2014-06-01: 3000 mL

## 2014-06-01 MED ORDER — BUPIVACAINE LIPOSOME 1.3 % IJ SUSP
INTRAMUSCULAR | Status: DC | PRN
  Administered 2014-06-01: 20 mL

## 2014-06-01 MED ORDER — LABETALOL HCL 5 MG/ML IV SOLN
INTRAVENOUS | Status: DC | PRN
  Administered 2014-06-01 (×2): 5 mg via INTRAVENOUS

## 2014-06-01 MED ORDER — POLYETHYLENE GLYCOL 3350 17 G PO PACK: 17.0000 g | PACK | Freq: Every day | ORAL | Status: DC | PRN

## 2014-06-01 MED ORDER — OXYCODONE-ACETAMINOPHEN 5-325 MG PO TABS
2.0000 | ORAL_TABLET | ORAL | Status: DC | PRN
  Administered 2014-06-01 – 2014-06-03 (×7): 2 via ORAL
  Filled 2014-06-01 (×7): qty 2

## 2014-06-01 MED ORDER — CISATRACURIUM BESYLATE (PF) 10 MG/5ML IV SOLN
INTRAVENOUS | Status: DC | PRN
  Administered 2014-06-01: 4 mg via INTRAVENOUS

## 2014-06-01 MED ORDER — ONDANSETRON HCL 4 MG/2ML IJ SOLN
INTRAMUSCULAR | Status: AC
Start: 2014-06-01 — End: 2014-06-01
  Filled 2014-06-01: qty 2

## 2014-06-01 MED ORDER — ACETAMINOPHEN 10 MG/ML IV SOLN
1000.0000 mg | Freq: Once | INTRAVENOUS | Status: AC
  Administered 2014-06-01: 1000 mg via INTRAVENOUS
  Filled 2014-06-01: qty 100

## 2014-06-01 MED ORDER — BUPIVACAINE LIPOSOME 1.3 % IJ SUSP
20.0000 mL | Freq: Once | INTRAMUSCULAR | Status: DC
  Filled 2014-06-01: qty 20

## 2014-06-01 MED ORDER — BUPIVACAINE-EPINEPHRINE 0.5% -1:200000 IJ SOLN
INTRAMUSCULAR | Status: DC | PRN
  Administered 2014-06-01: 20 mL

## 2014-06-01 MED ORDER — THROMBIN 5000 UNITS EX SOLR
CUTANEOUS | Status: AC
  Filled 2014-06-01: qty 5000

## 2014-06-01 MED ORDER — GLYCOPYRROLATE 0.2 MG/ML IJ SOLN
INTRAMUSCULAR | Status: AC
  Filled 2014-06-01: qty 2

## 2014-06-01 MED ORDER — FERROUS SULFATE 325 (65 FE) MG PO TABS
325.0000 mg | ORAL_TABLET | Freq: Three times a day (TID) | ORAL | Status: DC
  Administered 2014-06-02 – 2014-06-03 (×3): 325 mg via ORAL
  Filled 2014-06-01 (×7): qty 1

## 2014-06-01 MED ORDER — PHENOL 1.4 % MT LIQD
1.0000 | OROMUCOSAL | Status: DC | PRN
  Filled 2014-06-01: qty 177

## 2014-06-01 MED ORDER — PROPOFOL 10 MG/ML IV BOLUS
INTRAVENOUS | Status: AC
  Filled 2014-06-01: qty 20

## 2014-06-01 MED ORDER — FLEET ENEMA 7-19 GM/118ML RE ENEM: 1.0000 | ENEMA | Freq: Once | RECTAL | Status: AC | PRN

## 2014-06-01 MED ORDER — SODIUM CHLORIDE 0.9 % IR SOLN
Status: DC | PRN
  Administered 2014-06-01: 500 mL

## 2014-06-01 MED ORDER — FLUTICASONE FUROATE 27.5 MCG/SPRAY NA SUSP: 1.0000 | Freq: Two times a day (BID) | NASAL | Status: DC

## 2014-06-01 MED ORDER — LACTATED RINGERS IV SOLN
INTRAVENOUS | Status: DC
  Administered 2014-06-01: 100 mL/h via INTRAVENOUS

## 2014-06-01 MED ORDER — RIVAROXABAN 10 MG PO TABS
10.0000 mg | ORAL_TABLET | Freq: Every day | ORAL | Status: DC
  Administered 2014-06-02 – 2014-06-03 (×2): 10 mg via ORAL
  Filled 2014-06-01 (×3): qty 1

## 2014-06-01 MED ORDER — MEPERIDINE HCL 50 MG/ML IJ SOLN: 6.2500 mg | INTRAMUSCULAR | Status: DC | PRN

## 2014-06-01 MED ORDER — ONDANSETRON HCL 4 MG/2ML IJ SOLN
INTRAMUSCULAR | Status: DC | PRN
  Administered 2014-06-01: 4 mg via INTRAVENOUS

## 2014-06-01 MED ORDER — LIDOCAINE HCL (CARDIAC) 20 MG/ML IV SOLN
INTRAVENOUS | Status: AC
  Filled 2014-06-01: qty 5

## 2014-06-01 MED ORDER — METHOCARBAMOL 1000 MG/10ML IJ SOLN
500.0000 mg | Freq: Four times a day (QID) | INTRAVENOUS | Status: DC | PRN
  Administered 2014-06-01 (×2): 500 mg via INTRAVENOUS
  Filled 2014-06-01 (×4): qty 5

## 2014-06-01 MED ORDER — CHLORHEXIDINE GLUCONATE 4 % EX LIQD: 60.0000 mL | Freq: Once | CUTANEOUS | Status: DC

## 2014-06-01 MED ORDER — SUFENTANIL CITRATE 50 MCG/ML IV SOLN
INTRAVENOUS | Status: AC
  Filled 2014-06-01: qty 1

## 2014-06-01 MED ORDER — SODIUM CHLORIDE 0.9 % IR SOLN
Status: AC
  Filled 2014-06-01: qty 1

## 2014-06-01 MED ORDER — LIDOCAINE HCL (CARDIAC) 20 MG/ML IV SOLN
INTRAVENOUS | Status: DC | PRN
  Administered 2014-06-01: 100 mg via INTRAVENOUS
  Administered 2014-06-01: 30 mg via INTRATRACHEAL

## 2014-06-01 MED ORDER — NEOSTIGMINE METHYLSULFATE 10 MG/10ML IV SOLN
INTRAVENOUS | Status: AC
  Filled 2014-06-01: qty 1

## 2014-06-01 MED ORDER — CEFAZOLIN SODIUM-DEXTROSE 2-3 GM-% IV SOLR
INTRAVENOUS | Status: AC
  Filled 2014-06-01: qty 50

## 2014-06-01 MED ORDER — CELECOXIB 200 MG PO CAPS
200.0000 mg | ORAL_CAPSULE | Freq: Two times a day (BID) | ORAL | Status: DC
  Administered 2014-06-01 – 2014-06-03 (×4): 200 mg via ORAL
  Filled 2014-06-01 (×6): qty 1

## 2014-06-01 MED ORDER — MIDAZOLAM HCL 5 MG/5ML IJ SOLN
INTRAMUSCULAR | Status: DC | PRN
  Administered 2014-06-01: 2 mg via INTRAVENOUS

## 2014-06-01 MED ORDER — MENTHOL 3 MG MT LOZG: 1.0000 | LOZENGE | OROMUCOSAL | Status: DC | PRN

## 2014-06-01 MED ORDER — METHOCARBAMOL 500 MG PO TABS
500.0000 mg | ORAL_TABLET | Freq: Four times a day (QID) | ORAL | Status: DC | PRN
  Administered 2014-06-02 (×4): 500 mg via ORAL
  Filled 2014-06-01 (×5): qty 1

## 2014-06-01 MED ORDER — DEXAMETHASONE SODIUM PHOSPHATE 10 MG/ML IJ SOLN
INTRAMUSCULAR | Status: AC
  Filled 2014-06-01: qty 1

## 2014-06-01 MED ORDER — BUPIVACAINE-EPINEPHRINE 0.5% -1:200000 IJ SOLN
INTRAMUSCULAR | Status: AC
  Filled 2014-06-01: qty 1

## 2014-06-01 MED ORDER — DEXTROSE IN LACTATED RINGERS 5 % IV SOLN: INTRAVENOUS | Status: DC

## 2014-06-01 MED ORDER — SODIUM CHLORIDE 0.9 % IV SOLN
2000.0000 mg | INTRAVENOUS | Status: DC | PRN
  Administered 2014-06-01: 2000 mg via INTRAVENOUS

## 2014-06-01 MED ORDER — ACETAMINOPHEN 325 MG PO TABS
650.0000 mg | ORAL_TABLET | Freq: Four times a day (QID) | ORAL | Status: DC | PRN
  Administered 2014-06-02: 650 mg via ORAL
  Filled 2014-06-01: qty 2

## 2014-06-01 MED ORDER — FENTANYL CITRATE (PF) 100 MCG/2ML IJ SOLN: 25.0000 ug | INTRAMUSCULAR | Status: DC | PRN

## 2014-06-01 MED ORDER — ONDANSETRON HCL 4 MG/2ML IJ SOLN: 4.0000 mg | Freq: Four times a day (QID) | INTRAMUSCULAR | Status: DC | PRN

## 2014-06-01 MED ORDER — SODIUM CHLORIDE 0.9 % IV SOLN
2000.0000 mg | Freq: Once | INTRAVENOUS | Status: DC
  Filled 2014-06-01: qty 20

## 2014-06-01 MED ORDER — ROCURONIUM BROMIDE 100 MG/10ML IV SOLN
INTRAVENOUS | Status: DC | PRN
  Administered 2014-06-01: 5 mg via INTRAVENOUS

## 2014-06-01 MED ORDER — SODIUM CHLORIDE 0.9 % IJ SOLN
INTRAMUSCULAR | Status: AC
  Filled 2014-06-01: qty 30

## 2014-06-01 MED ORDER — MIDAZOLAM HCL 2 MG/2ML IJ SOLN
INTRAMUSCULAR | Status: AC
  Filled 2014-06-01: qty 2

## 2014-06-01 MED ORDER — PROPOFOL 10 MG/ML IV BOLUS
INTRAVENOUS | Status: DC | PRN
Start: 2014-06-01 — End: 2014-06-01
  Administered 2014-06-01: 200 mg via INTRAVENOUS
  Administered 2014-06-01: 100 mg via INTRAVENOUS
  Administered 2014-06-01: 30 mg via INTRAVENOUS
  Administered 2014-06-01: 20 mg via INTRAVENOUS

## 2014-06-01 MED ORDER — SUCCINYLCHOLINE CHLORIDE 20 MG/ML IJ SOLN
INTRAMUSCULAR | Status: DC | PRN
  Administered 2014-06-01: 100 mg via INTRAVENOUS

## 2014-06-01 MED ORDER — LACTATED RINGERS IV SOLN
INTRAVENOUS | Status: DC
  Administered 2014-06-01: 1000 mL via INTRAVENOUS

## 2014-06-01 MED ORDER — NEOSTIGMINE METHYLSULFATE 10 MG/10ML IV SOLN
INTRAVENOUS | Status: DC | PRN
  Administered 2014-06-01: 3 mg via INTRAVENOUS

## 2014-06-01 MED ORDER — PREGABALIN 75 MG PO CAPS
150.0000 mg | ORAL_CAPSULE | Freq: Two times a day (BID) | ORAL | Status: DC
  Administered 2014-06-01 – 2014-06-03 (×4): 150 mg via ORAL
  Filled 2014-06-01 (×4): qty 2

## 2014-06-01 MED ORDER — MORPHINE SULFATE 2 MG/ML IJ SOLN
1.0000 mg | INTRAMUSCULAR | Status: DC | PRN
  Administered 2014-06-01 (×2): 1 mg via INTRAVENOUS
  Filled 2014-06-01: qty 1

## 2014-06-01 MED ORDER — LISINOPRIL 10 MG PO TABS
10.0000 mg | ORAL_TABLET | Freq: Every day | ORAL | Status: DC
  Administered 2014-06-02 – 2014-06-03 (×2): 10 mg via ORAL
  Filled 2014-06-01 (×3): qty 1

## 2014-06-01 MED ORDER — BISACODYL 5 MG PO TBEC: 5.0000 mg | DELAYED_RELEASE_TABLET | Freq: Every day | ORAL | Status: DC | PRN

## 2014-06-01 MED ORDER — ACETAMINOPHEN 650 MG RE SUPP: 650.0000 mg | Freq: Four times a day (QID) | RECTAL | Status: DC | PRN

## 2014-06-01 MED ORDER — ONDANSETRON HCL 4 MG PO TABS: 4.0000 mg | ORAL_TABLET | Freq: Four times a day (QID) | ORAL | Status: DC | PRN

## 2014-06-01 MED ORDER — SUFENTANIL CITRATE 50 MCG/ML IV SOLN
INTRAVENOUS | Status: DC | PRN
  Administered 2014-06-01: 5 ug via INTRAVENOUS
  Administered 2014-06-01: 10 ug via INTRAVENOUS
  Administered 2014-06-01: 5 ug via INTRAVENOUS
  Administered 2014-06-01 (×3): 10 ug via INTRAVENOUS
  Administered 2014-06-01: 5 ug via INTRAVENOUS
  Administered 2014-06-01: 20 ug via INTRAVENOUS

## 2014-06-01 MED ORDER — CEFAZOLIN SODIUM 1-5 GM-% IV SOLN
1.0000 g | Freq: Four times a day (QID) | INTRAVENOUS | Status: AC
  Administered 2014-06-01 – 2014-06-02 (×2): 1 g via INTRAVENOUS
  Filled 2014-06-01 (×2): qty 50

## 2014-06-01 MED ORDER — INSULIN ASPART 100 UNIT/ML ~~LOC~~ SOLN
0.0000 [IU] | Freq: Three times a day (TID) | SUBCUTANEOUS | Status: DC
  Administered 2014-06-02 – 2014-06-03 (×3): 2 [IU] via SUBCUTANEOUS

## 2014-06-01 MED ORDER — THROMBIN 5000 UNITS EX SOLR
OROMUCOSAL | Status: DC | PRN
  Administered 2014-06-01: 10 mL via TOPICAL

## 2014-06-01 MED ORDER — DEXTROSE IN LACTATED RINGERS 5 % IV SOLN
INTRAVENOUS | Status: DC | PRN
  Administered 2014-06-01: 12:00:00 via INTRAVENOUS

## 2014-06-01 MED ORDER — DEXAMETHASONE SODIUM PHOSPHATE 10 MG/ML IJ SOLN
INTRAMUSCULAR | Status: DC | PRN
  Administered 2014-06-01: 10 mg via INTRAVENOUS

## 2014-06-01 SURGICAL SUPPLY — 75 items
BAG DECANTER FOR FLEXI CONT (MISCELLANEOUS) ×3 IMPLANT
BAG ZIPLOCK 12X15 (MISCELLANEOUS) IMPLANT
BANDAGE ELASTIC 4 VELCRO ST LF (GAUZE/BANDAGES/DRESSINGS) ×3 IMPLANT
BANDAGE ELASTIC 6 VELCRO ST LF (GAUZE/BANDAGES/DRESSINGS) ×3 IMPLANT
BANDAGE ESMARK 6X9 LF (GAUZE/BANDAGES/DRESSINGS) ×1 IMPLANT
BLADE SAG 18X100X1.27 (BLADE) ×3 IMPLANT
BLADE SAW SGTL 11.0X1.19X90.0M (BLADE) ×3 IMPLANT
BNDG ESMARK 6X9 LF (GAUZE/BANDAGES/DRESSINGS) ×3
BONE CEMENT GENTAMICIN (Cement) ×6 IMPLANT
CAP KNEE TOTAL 3 SIGMA ×3 IMPLANT
CEMENT BONE GENTAMICIN 40 (Cement) ×2 IMPLANT
CUFF TOURN SGL QUICK 34 (TOURNIQUET CUFF) ×2
CUFF TRNQT CYL 34X4X40X1 (TOURNIQUET CUFF) ×1 IMPLANT
DERMABOND ADVANCED (GAUZE/BANDAGES/DRESSINGS) ×2
DERMABOND ADVANCED .7 DNX12 (GAUZE/BANDAGES/DRESSINGS) ×1 IMPLANT
DRAPE EXTREMITY T 121X128X90 (DRAPE) ×3 IMPLANT
DRAPE INCISE IOBAN 66X45 STRL (DRAPES) IMPLANT
DRAPE POUCH INSTRU U-SHP 10X18 (DRAPES) ×3 IMPLANT
DRAPE SHEET LG 3/4 BI-LAMINATE (DRAPES) ×3 IMPLANT
DRAPE U-SHAPE 47X51 STRL (DRAPES) ×6 IMPLANT
DRSG AQUACEL AG ADV 3.5X 6 (GAUZE/BANDAGES/DRESSINGS) ×3 IMPLANT
DRSG AQUACEL AG ADV 3.5X10 (GAUZE/BANDAGES/DRESSINGS) ×3 IMPLANT
DRSG PAD ABDOMINAL 8X10 ST (GAUZE/BANDAGES/DRESSINGS) IMPLANT
DRSG TEGADERM 4X4.75 (GAUZE/BANDAGES/DRESSINGS) ×3 IMPLANT
DURAPREP 26ML APPLICATOR (WOUND CARE) ×3 IMPLANT
ELECT REM PT RETURN 9FT ADLT (ELECTROSURGICAL) ×3
ELECTRODE REM PT RTRN 9FT ADLT (ELECTROSURGICAL) ×1 IMPLANT
EVACUATOR 1/8 PVC DRAIN (DRAIN) ×3 IMPLANT
FACESHIELD WRAPAROUND (MASK) ×15 IMPLANT
GAUZE SPONGE 2X2 8PLY STRL LF (GAUZE/BANDAGES/DRESSINGS) ×1 IMPLANT
GLOVE BIOGEL PI IND STRL 6.5 (GLOVE) ×1 IMPLANT
GLOVE BIOGEL PI IND STRL 8 (GLOVE) ×1 IMPLANT
GLOVE BIOGEL PI INDICATOR 6.5 (GLOVE) ×2
GLOVE BIOGEL PI INDICATOR 8 (GLOVE) ×2
GLOVE ECLIPSE 8.0 STRL XLNG CF (GLOVE) ×6 IMPLANT
GLOVE SURG SS PI 6.5 STRL IVOR (GLOVE) ×3 IMPLANT
GOWN STRL REUS W/TWL LRG LVL3 (GOWN DISPOSABLE) ×3 IMPLANT
GOWN STRL REUS W/TWL XL LVL3 (GOWN DISPOSABLE) ×3 IMPLANT
HANDPIECE INTERPULSE COAX TIP (DISPOSABLE) ×2
IMMOBILIZER KNEE 20 (SOFTGOODS) ×6 IMPLANT
IMMOBILIZER KNEE 20 THIGH 36 (SOFTGOODS) ×1 IMPLANT
KIT BASIN OR (CUSTOM PROCEDURE TRAY) ×3 IMPLANT
LIQUID BAND (GAUZE/BANDAGES/DRESSINGS) ×3 IMPLANT
MANIFOLD NEPTUNE II (INSTRUMENTS) ×3 IMPLANT
NDL SAFETY ECLIPSE 18X1.5 (NEEDLE) ×2 IMPLANT
NEEDLE HYPO 18GX1.5 SHARP (NEEDLE) ×4
NEEDLE HYPO 22GX1.5 SAFETY (NEEDLE) ×3 IMPLANT
NS IRRIG 1000ML POUR BTL (IV SOLUTION) IMPLANT
PACK TOTAL JOINT (CUSTOM PROCEDURE TRAY) ×3 IMPLANT
PADDING CAST COTTON 6X4 STRL (CAST SUPPLIES) IMPLANT
PEN SKIN MARKING BROAD (MISCELLANEOUS) ×3 IMPLANT
POSITIONER SURGICAL ARM (MISCELLANEOUS) ×3 IMPLANT
SET HNDPC FAN SPRY TIP SCT (DISPOSABLE) ×1 IMPLANT
SET PAD KNEE POSITIONER (MISCELLANEOUS) ×3 IMPLANT
SPONGE GAUZE 2X2 STER 10/PKG (GAUZE/BANDAGES/DRESSINGS) ×2
SPONGE LAP 18X18 X RAY DECT (DISPOSABLE) IMPLANT
SPONGE SURGIFOAM ABS GEL 100 (HEMOSTASIS) ×3 IMPLANT
STAPLER VISISTAT 35W (STAPLE) IMPLANT
SUCTION FRAZIER 12FR DISP (SUCTIONS) ×3 IMPLANT
SUT BONE WAX W31G (SUTURE) ×3 IMPLANT
SUT MNCRL AB 4-0 PS2 18 (SUTURE) ×3 IMPLANT
SUT VIC AB 1 CT1 27 (SUTURE) ×4
SUT VIC AB 1 CT1 27XBRD ANTBC (SUTURE) ×2 IMPLANT
SUT VIC AB 2-0 CT1 27 (SUTURE) ×6
SUT VIC AB 2-0 CT1 TAPERPNT 27 (SUTURE) ×3 IMPLANT
SUT VLOC 180 0 24IN GS25 (SUTURE) ×3 IMPLANT
SYR 20CC LL (SYRINGE) ×6 IMPLANT
SYR 50ML LL SCALE MARK (SYRINGE) ×3 IMPLANT
TOWEL OR 17X26 10 PK STRL BLUE (TOWEL DISPOSABLE) ×3 IMPLANT
TOWEL OR NON WOVEN STRL DISP B (DISPOSABLE) IMPLANT
TOWER CARTRIDGE SMART MIX (DISPOSABLE) ×3 IMPLANT
TRAY FOLEY W/METER SILVER 14FR (SET/KITS/TRAYS/PACK) ×3 IMPLANT
WATER STERILE IRR 1500ML POUR (IV SOLUTION) ×3 IMPLANT
WRAP KNEE MAXI GEL POST OP (GAUZE/BANDAGES/DRESSINGS) ×3 IMPLANT
YANKAUER SUCT BULB TIP 10FT TU (MISCELLANEOUS) ×3 IMPLANT

## 2014-06-01 NOTE — Transfer of Care (Signed)
Immediate Anesthesia Transfer of Care Note  Patient: Garrett Mcknight  Procedure(s) Performed: Procedure(s): RIGHT TOTAL KNEE ARTHROPLASTY (Right)  Patient Location: PACU  Anesthesia Type:General  Level of Consciousness: awake, alert , oriented and patient cooperative  Airway & Oxygen Therapy: Patient Spontanous Breathing and Patient connected to face mask oxygen  Post-op Assessment: Report given to RN, Post -op Vital signs reviewed and stable and Patient moving all extremities X 4  Post vital signs: stable  Last Vitals:  Filed Vitals:   06/01/14 1457  BP: 206/110  Pulse: 75  Temp: 37.1 C  Resp: 15    Complications: No apparent anesthesia complications

## 2014-06-01 NOTE — Anesthesia Postprocedure Evaluation (Signed)
  Anesthesia Post-op Note  Patient: Garrett Mcknight  Procedure(s) Performed: Procedure(s): RIGHT TOTAL KNEE ARTHROPLASTY (Right)  Patient Location: PACU  Anesthesia Type:General  Level of Consciousness: awake  Airway and Oxygen Therapy: Patient Spontanous Breathing and Patient connected to nasal cannula oxygen  Post-op Pain: mild  Post-op Assessment: Post-op Vital signs reviewed, Patient's Cardiovascular Status Stable, Respiratory Function Stable and Patent Airway  Post-op Vital Signs: Reviewed and stable  Last Vitals:  Filed Vitals:   06/01/14 1549  BP: 168/93  Pulse: 64  Temp: 36.8 C  Resp: 14    Complications: No apparent anesthesia complications

## 2014-06-01 NOTE — Brief Op Note (Signed)
06/01/2014  2:24 PM  PATIENT:  Garrett Mcknight  64 y.o. male  PRE-OPERATIVE DIAGNOSIS:  right knee Primary  osteoarthritis  POST-OPERATIVE DIAGNOSIS:  right knee Primary osteoarthritis  PROCEDURE:  Procedure(s): RIGHT TOTAL KNEE ARTHROPLASTY (Right)  SURGEON:  Surgeon(s) and Role:    * Latanya Maudlin, MD - Primary  PHYSICIAN ASSISTANT: Ardeen Jourdain PA  ASSISTANTS: Ardeen Jourdain PA   ANESTHESIA:   general  EBL:  Total I/O In: 1000 [I.V.:1000] Out: 100 [Urine:100]  BLOOD ADMINISTERED:none  DRAINS: (One) Hemovact drain(s) in the one with  Suction Open   LOCAL MEDICATIONS USED:  MARCAINE 0.50%with Epinephrine,20cc and Exparel20cc mixed with 20cc of Normal Saline.    SPECIMEN:  No Specimen  DISPOSITION OF SPECIMEN:  N/A  COUNTS:  YES  TOURNIQUET:  * Missing tourniquet times found for documented tourniquets in log:  [id number] *  DICTATION: .Other Dictation: Dictation Number `[id number]  PLAN OF CARE: Admit to inpatient   PATIENT DISPOSITION:  PACU - hemodynamically stable.   Delay start of Pharmacological VTE agent (>24hrs) due to surgical blood loss or risk of bleeding: yes

## 2014-06-01 NOTE — Interval H&P Note (Signed)
History and Physical Interval Note:  06/01/2014 12:15 PM  Garrett Mcknight  has presented today for surgery, with the diagnosis of right knee osteoarthritis  The various methods of treatment have been discussed with the patient and family. After consideration of risks, benefits and other options for treatment, the patient has consented to  Procedure(s): RIGHT TOTAL KNEE ARTHROPLASTY (Right) as a surgical intervention .  The patient's history has been reviewed, patient examined, no change in status, stable for surgery.  I have reviewed the patient's chart and labs.  Questions were answered to the patient's satisfaction.     Barbarita Hutmacher A

## 2014-06-01 NOTE — Anesthesia Procedure Notes (Signed)
Procedure Name: Intubation Date/Time: 06/01/2014 12:40 PM Performed by: Lissa Morales Pre-anesthesia Checklist: Patient identified, Emergency Drugs available, Suction available, Patient being monitored and Timeout performed Patient Re-evaluated:Patient Re-evaluated prior to inductionOxygen Delivery Method: Circle system utilized Preoxygenation: Pre-oxygenation with 100% oxygen Intubation Type: IV induction Ventilation: Mask ventilation without difficulty Laryngoscope Size: Mac and 3 Grade View: Grade III Tube type: Oral Tube size: 7.5 mm Number of attempts: 2 (1 by SRNA, 1 by Dr. Carrie Mew) Airway Equipment and Method: Stylet and Oral airway Placement Confirmation: ETT inserted through vocal cords under direct vision,  positive ETCO2 and breath sounds checked- equal and bilateral Secured at: 23 cm Dental Injury: Teeth and Oropharynx as per pre-operative assessment  Difficulty Due To: Difficult Airway- due to anterior larynx, Difficult Airway- due to reduced neck mobility and Difficult Airway- due to dentition Comments: Recommend Glidescope for future intubation

## 2014-06-01 NOTE — Evaluation (Deleted)
Physical Therapy Evaluation Patient Details Name: Garrett Mcknight MRN: [id number] DOB: 02-15-1950 Today's Date: 06/01/2014   History of Present Illness  s/p L DATHA , and pt had R DATHA in 2014.   Clinical Impression  Pt s/p L DATHA presents with some decreased mobility and will benefit from PT to return home alone at Mod I level.     Follow Up Recommendations Home health PT    Equipment Recommendations  Rolling walker with 5" wheels (may not need one depending on pt's progress, and she may want to borrow one from the church. )    Recommendations for Other Services       Precautions / Restrictions Precautions Precautions: None Restrictions Weight Bearing Restrictions: No      Mobility  Bed Mobility Overal bed mobility: Needs Assistance Bed Mobility: Supine to Sit;Sit to Supine     Supine to sit: Min guard     General bed mobility comments: HOB elevated and use of rail , minimal assist with LLE  Transfers Overall transfer level: Needs assistance Equipment used: Rolling walker (2 wheeled) Transfers: Sit to/from Stand Sit to Stand: Min guard         General transfer comment: cues for hand [employer] and safety with RW   Ambulation/Gait Ambulation/Gait assistance: Min assist Ambulation Distance (Feet): 50 Feet Assistive device: Rolling walker (2 wheeled) Gait Pattern/deviations: Step-to pattern        Stairs            Wheelchair Mobility    Modified Rankin (Stroke Patients Only)       Balance                                             Pertinent Vitals/Pain Pain Assessment: 0-10 Pain Score: 1  Pain Location: L hip , I can feel it , but not much at all!!  Pain Descriptors / Indicators: Aching Pain Intervention(s): Monitored during session;Ice applied    Home Living Family/patient expects to be discharged to:: Private residence Living Arrangements: Alone Available Help at Discharge: Family (will have friends set up with  meals and checking on her) Type of Home: House Home Access: Stairs to enter Entrance Stairs-Rails: Can reach both Entrance Stairs-Number of Steps: 5 Home Layout: One level Home Equipment: Cane - single point (can have access to RW if she will need one for a few days. )      Prior Function Level of Independence: Independent         Comments: still works at [employer], just steps were becomng very painful and one step at a time due to pain.      Hand Dominance        Extremity/Trunk Assessment               Lower Extremity Assessment: LLE deficits/detail   LLE Deficits / Details: very little limited due to SP surgery, however was able to movment independently in the bed.      Communication   Communication: No difficulties  Cognition Arousal/Alertness: Awake/alert Behavior During Therapy: WFL for tasks assessed/performed Overall Cognitive Status: Within Functional Limits for tasks assessed                      General Comments      Exercises Total Joint Exercises Ankle Circles/Pumps: AROM;Both;10 reps Quad Sets: Left;AROM;5 reps;Supine Heel Slides: AROM;Supine;Left;5  reps Hip ABduction/ADduction: AAROM;Supine;Left;5 reps      Assessment/Plan    PT Assessment Patient needs continued PT services  PT Diagnosis Difficulty walking   PT Problem List Decreased strength;Decreased range of motion;Decreased activity tolerance;Decreased mobility;Decreased knowledge of use of DME  PT Treatment Interventions DME instruction;Gait training;Stair training;Functional mobility training;Therapeutic activities;Therapeutic exercise;Patient/family education   PT Goals (Current goals can be found in the Care Plan section) Acute Rehab PT Goals Patient Stated Goal: To get better and get going again PT Goal Formulation: With patient Time For Goal Achievement: 06/15/14 Potential to Achieve Goals: Good    Frequency 7X/week   Barriers to discharge         Co-evaluation               End of Session Equipment Utilized During Treatment: Gait belt Activity Tolerance: Patient tolerated treatment well Patient left: in chair Nurse Communication: Mobility status         Time: 1630-1650 PT Time Calculation (min) (ACUTE ONLY): 20 min   Charges:   PT Evaluation $Initial PT Evaluation Tier I: 1 Procedure PT Treatments $Gait Training: 8-22 mins   PT G CodesClide Dales June 24, 2014, 6:00 PM Clide Dales, PT Pager: [phone] 06-24-14

## 2014-06-02 ENCOUNTER — Ambulatory Visit: Payer: Medicare Other | Admitting: Family Medicine

## 2014-06-02 LAB — BASIC METABOLIC PANEL
ANION GAP: 10 (ref 5–15)
BUN: 9 mg/dL (ref 6–20)
CO2: 26 mmol/L (ref 22–32)
Calcium: 8.9 mg/dL (ref 8.9–10.3)
Chloride: 101 mmol/L (ref 101–111)
Creatinine, Ser: 0.65 mg/dL (ref 0.61–1.24)
Glucose, Bld: 167 mg/dL — ABNORMAL HIGH (ref 65–99)
POTASSIUM: 3.9 mmol/L (ref 3.5–5.1)
Sodium: 137 mmol/L (ref 135–145)

## 2014-06-02 LAB — CBC
HEMATOCRIT: 40.5 % (ref 39.0–52.0)
HEMOGLOBIN: 13.9 g/dL (ref 13.0–17.0)
MCH: 29.1 pg (ref 26.0–34.0)
MCHC: 34.3 g/dL (ref 30.0–36.0)
MCV: 84.9 fL (ref 78.0–100.0)
Platelets: 212 10*3/uL (ref 150–400)
RBC: 4.77 MIL/uL (ref 4.22–5.81)
RDW: 12.9 % (ref 11.5–15.5)
WBC: 15 10*3/uL — ABNORMAL HIGH (ref 4.0–10.5)

## 2014-06-02 LAB — GLUCOSE, CAPILLARY
GLUCOSE-CAPILLARY: 148 mg/dL — AB (ref 65–99)
Glucose-Capillary: 100 mg/dL — ABNORMAL HIGH (ref 65–99)
Glucose-Capillary: 147 mg/dL — ABNORMAL HIGH (ref 65–99)
Glucose-Capillary: 150 mg/dL — ABNORMAL HIGH (ref 65–99)
Glucose-Capillary: 139 mg/dL — ABNORMAL HIGH (ref 65–99)

## 2014-06-02 NOTE — Evaluation (Signed)
Physical Therapy Evaluation Patient Details Name: Garrett Mcknight MRN: [id number] DOB: 05-Jun-1950 Today's Date: 06/02/2014   History of Present Illness  Pt is s/p R TKA on 06/01/14. Pt with PMH L5 lumbar surgery, R wrist reconstruction, L rotator cuff repair, sleep apnea and L TKA  Clinical Impression  Pt will benefit from PT to address deficits below; will need HHPT; Pt is doing very well today, will see in pm    Follow Up Recommendations Home health PT    Equipment Recommendations  None recommended by PT    Recommendations for Other Services       Precautions / Restrictions Precautions Precautions: Knee Precaution Comments: I SLR with 7* quad lag, KI not used Restrictions Weight Bearing Restrictions: No Other Position/Activity Restrictions: WBAT      Mobility  Bed Mobility Overal bed mobility: Needs Assistance Bed Mobility: Sit to Supine     Supine to sit: Supervision Sit to supine: Supervision   General bed mobility comments: pt self assists RLE with LLE, incr time  Transfers Overall transfer level: Needs assistance Equipment used: Rolling walker (2 wheeled) Transfers: Sit to/from Stand Sit to Stand: Min guard         General transfer comment: verbal cues for hand placement  Ambulation/Gait Ambulation/Gait assistance: Supervision;Min guard Ambulation Distance (Feet): 350 Feet Assistive device: Crutches Gait Pattern/deviations: Step-through pattern     General Gait Details: cues for safety initially  Stairs            Wheelchair Mobility    Modified Rankin (Stroke Patients Only)       Balance Overall balance assessment: No apparent balance deficits (not formally assessed)                                           Pertinent Vitals/Pain Pain Assessment: 0-10 Pain Score: 3  Pain Location: R knee Pain Descriptors / Indicators: Sore Pain Intervention(s): Limited activity within patient's tolerance;Monitored during  session;Premedicated before session;Repositioned;Ice applied    Home Living Family/patient expects to be discharged to:: Private residence Living Arrangements: Spouse/significant other Available Help at Discharge: Available PRN/intermittently Type of Home: House Home Access: Ramped entrance     Home Layout: One level Home Equipment: Walker - 2 wheels;Crutches;Shower seat      Prior Function Level of Independence: Independent               Hand Dominance        Extremity/Trunk Assessment   Upper Extremity Assessment: Defer to OT evaluation RUE Deficits / Details: 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   RUE Sensation: decreased light touch     Lower Extremity Assessment: RLE deficits/detail RLE Deficits / Details: knee extension and hip flexion 3/5; knee flexion 7 to 55* flexion AROM; ankle WFL       Communication   Communication: No difficulties  Cognition Arousal/Alertness: Awake/alert Behavior During Therapy: WFL for tasks assessed/performed Overall Cognitive Status: Within Functional Limits for tasks assessed                      General Comments      Exercises Total Joint Exercises Ankle Circles/Pumps: AROM;Both;10 reps Quad Sets: AROM;Both;10 reps Straight Leg Raises: AROM;10 reps;Right      Assessment/Plan    PT Assessment Patient needs continued PT services  PT Diagnosis Difficulty walking  PT Problem List Decreased strength;Decreased activity tolerance;Decreased mobility;Decreased range of motion  PT Treatment Interventions DME instruction;Gait training;Functional mobility training;Therapeutic activities;Patient/family education;Therapeutic exercise   PT Goals (Current goals can be found in the Care Plan section) Acute Rehab PT Goals Patient Stated Goal: home and more walking PT Goal Formulation: With patient Time For Goal Achievement: 06/04/14 Potential to Achieve Goals:  Good    Frequency BID   Barriers to discharge        Co-evaluation               End of Session Equipment Utilized During Treatment: Gait belt Activity Tolerance: Patient tolerated treatment well Patient left: with call bell/phone within reach;in bed;with nursing/sitter in room Nurse Communication: Mobility status         Time: 0940-1004 PT Time Calculation (min) (ACUTE ONLY): 24 min   Charges:   PT Evaluation $Initial PT Evaluation Tier I: 1 Procedure PT Treatments $Gait Training: 8-22 mins   PT G Codes:        Destina Mantei June 27, 2014, 10:14 AM

## 2014-06-02 NOTE — Evaluation (Signed)
Occupational Therapy Evaluation Patient Details Name: Garrett Mcknight MRN: [id number] DOB: [birth year] Today's Date: 06/02/2014    History of Present Illness Pt is s/p R TKA on 06/01/14. Pt with PMH L5 lumbar surgery, R wrist reconstruction, L rotator cuff repair, sleep apnea and L TKA   Clinical Impression   Pt doing well and very motivated to get up and move. Pt will benefit from continued OT to progress ADL independence for d/c home.     Follow Up Recommendations  No OT follow up    Equipment Recommendations  None recommended by OT    Recommendations for Other Services       Precautions / Restrictions Precautions Precautions: Knee Restrictions Weight Bearing Restrictions: No      Mobility Bed Mobility Overal bed mobility: Needs Assistance Bed Mobility: Supine to Sit     Supine to sit: Supervision        Transfers Overall transfer level: Needs assistance Equipment used: Rolling walker (2 wheeled) Transfers: Sit to/from Stand Sit to Stand: Min guard         General transfer comment: verbal cues for hand placement    Balance                                            ADL   Eating/Feeding: Independent;Sitting   Grooming: Wash/dry hands;Set up;Sitting   Upper Body Bathing: Set up;Sitting   Lower Body Bathing: Min guard;Sit to/from stand   Upper Body Dressing : Set up;Sitting   Lower Body Dressing: Min guard;Sit to/from stand   Toilet Transfer: Min guard;Ambulation;Comfort height toilet;RW   Toileting- Water quality scientist and Hygiene: Min guard;Sit to/from stand         General ADL Comments: Pt states he has a tub seat if needed and recommended pt have initial supervision with showering for safety. Pt has a higher commode at home and vanity and did well with higher commode here and bar on R. Pt able to reach down and don R sock with some efffort. Educated on not twisting R knee and not putting pillow under R knee.       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 RUE Sensation: decreased light touch           Communication Communication Communication: No difficulties   Cognition Arousal/Alertness: Awake/alert Behavior During Therapy: WFL for tasks assessed/performed Overall Cognitive Status: Within Functional Limits for tasks assessed                     General Comments       Exercises       Shoulder Instructions      Home Living Family/patient expects to be discharged to:: Private residence Living Arrangements: Spouse/significant other Available Help at Discharge: Available PRN/intermittently Type of Home: House Home Access: Ramped entrance     Home Layout: One level     Bathroom Shower/Tub: Teacher, early years/pre: Handicapped height     Home Equipment: Environmental consultant - 2 wheels;Crutches;Shower seat  Prior Functioning/Environment Level of Independence: Independent             OT Diagnosis: Generalized weakness   OT Problem List: Decreased strength;Decreased knowledge of use of DME or AE   OT Treatment/Interventions: Self-care/ADL training;Patient/family education;Therapeutic activities;DME and/or AE instruction    OT Goals(Current goals can be found in the care plan section) Acute Rehab OT Goals Patient Stated Goal: home and more walking OT Goal Formulation: With patient Time For Goal Achievement: 06/09/14 Potential to Achieve Goals: Good  OT Frequency: Min 2X/week   Barriers to D/C:            Co-evaluation              End of Session  Equipment Utilized During Treatment: Gait belt;Rolling walker  Activity Tolerance: Patient tolerated treatment well Patient left: in chair;with call bell/phone within reach   Time: 0855-0928 OT Time Calculation (min): 33 min Charges:  OT General Charges $OT Visit: 1 Procedure OT Evaluation $Initial OT Evaluation Tier I: 1 Procedure OT Treatments $Therapeutic Activity: 8-22 mins G-Codes:    Jules Schick  [phone] 06/02/2014, 9:47 AM

## 2014-06-02 NOTE — Addendum Note (Signed)
Addendum  created 06/02/14 1110 by Lissa Morales, CRNA   Modules edited: Anesthesia Attestations

## 2014-06-02 NOTE — Care Management Note (Signed)
Case Management Note  Patient Details  Name: Garrett Mcknight MRN: [id number] Date of Birth: [birth year]  Subjective/Objective:                 RIGHT TOTAL KNEE ARTHROPLASTY (Right) Action/Plan: Discharge planning  Expected Discharge Date:  06/03/14               Expected Discharge Plan:  [place]  In-House Referral:     Discharge planning Services  CM Consult  Post Acute Care Choice:  Home Health Choice offered to:  Patient  DME Arranged:    DME Agency:     HH Arranged:  PT [place]:  [place] [facility] [place]  Status of Service:  Completed, signed off  Medicare Important Message Given:    Date Medicare IM Given:    Medicare IM give by:    Date Additional Medicare IM Given:    Additional Medicare Important Message give by:     If discussed at [place] of Stay Meetings, dates discussed:    Additional Comments: 15:00 CM met with pt to offer choice of home health agency.  Pt, who has CPAP with [facility] states he would like to have HHPT with [facility].  Address and contact information verified by pt.  No DME is needed.  Referral called to [facility] rep, Kristen.  No other CM needs were communicated. Dellie Catholic, RN 06/02/2014, 3:43 PM

## 2014-06-02 NOTE — Discharge Instructions (Addendum)
INSTRUCTIONS AFTER JOINT REPLACEMENT  ° °Remove items at home which could result in a fall. This includes throw rugs or furniture in walking pathways °ICE to the affected joint every three hours while awake for 30 minutes at a time, for at least the first 3-5 days, and then as needed for pain and swelling.  Continue to use ice for pain and swelling. You may notice swelling that will progress down to the foot and ankle.  This is normal after surgery.  Elevate your leg when you are not up walking on it.   °Continue to use the breathing machine you got in the hospital (incentive spirometer) which will help keep your temperature down.  It is common for your temperature to cycle up and down following surgery, especially at night when you are not up moving around and exerting yourself.  The breathing machine keeps your lungs expanded and your temperature down. ° ° °DIET:  As you were doing prior to hospitalization, we recommend a well-balanced diet. ° °DRESSING / WOUND CARE / SHOWERING ° °Keep the surgical dressing until follow up.  The dressing is water proof, so you can shower without any extra covering.  IF THE DRESSING FALLS OFF or the wound gets wet inside, change the dressing with sterile gauze.  Please use good hand washing techniques before changing the dressing.  Do not use any lotions or creams on the incision until instructed by your surgeon.   ° °ACTIVITY ° °Increase activity slowly as tolerated, but follow the weight bearing instructions below.   °No driving for 6 weeks or until further direction given by your physician.  You cannot drive while taking narcotics.  °No lifting or carrying greater than 10 lbs. until further directed by your surgeon. °Avoid periods of inactivity such as sitting longer than an hour when not asleep. This helps prevent blood clots.  °You may return to work once you are authorized by your doctor.  ° ° ° °WEIGHT BEARING  ° °Weight bearing as tolerated with assist device (walker, cane,  etc) as directed, use it as long as suggested by your surgeon or therapist, typically at least 4-6 weeks. ° ° °EXERCISES ° °Results after joint replacement surgery are often greatly improved when you follow the exercise, range of motion and muscle strengthening exercises prescribed by your doctor. Safety measures are also important to protect the joint from further injury. Any time any of these exercises cause you to have increased pain or swelling, decrease what you are doing until you are comfortable again and then slowly increase them. If you have problems or questions, call your caregiver or physical therapist for advice.  ° °Rehabilitation is important following a joint replacement. After just a few days of immobilization, the muscles of the leg can become weakened and shrink (atrophy).  These exercises are designed to build up the tone and strength of the thigh and leg muscles and to improve motion. Often times heat used for twenty to thirty minutes before working out will loosen up your tissues and help with improving the range of motion but do not use heat for the first two weeks following surgery (sometimes heat can increase post-operative swelling).  ° °These exercises can be done on a training (exercise) mat, on the floor, on a table or on a bed. Use whatever works the best and is most comfortable for you.    Use music or television while you are exercising so that the exercises are a pleasant break in your   day. This will make your life better with the exercises acting as a break in your routine that you can look forward to.   Perform all exercises about fifteen times, three times per day or as directed.  You should exercise both the operative leg and the other leg as well.   Exercises include:   Quad Sets - Tighten up the muscle on the front of the thigh (Quad) and hold for 5-10 seconds.   Straight Leg Raises - With your knee straight (if you were given a brace, keep it on), lift the leg to 60  degrees, hold for 3 seconds, and slowly lower the leg.  Perform this exercise against resistance later as your leg gets stronger.  Leg Slides: Lying on your back, slowly slide your foot toward your buttocks, bending your knee up off the floor (only go as far as is comfortable). Then slowly slide your foot back down until your leg is flat on the floor again.  Angel Wings: Lying on your back spread your legs to the side as far apart as you can without causing discomfort.  Hamstring Strength:  Lying on your back, push your heel against the floor with your leg straight by tightening up the muscles of your buttocks.  Repeat, but this time bend your knee to a comfortable angle, and push your heel against the floor.  You may put a pillow under the heel to make it more comfortable if necessary.   A rehabilitation program following joint replacement surgery can speed recovery and prevent re-injury in the future due to weakened muscles. Contact your doctor or a physical therapist for more information on knee rehabilitation.    CONSTIPATION  Constipation is defined medically as fewer than three stools per week and severe constipation as less than one stool per week.  Even if you have a regular bowel pattern at home, your normal regimen is likely to be disrupted due to multiple reasons following surgery.  Combination of anesthesia, postoperative narcotics, change in appetite and fluid intake all can affect your bowels.   YOU MUST use at least one of the following options; they are listed in order of increasing strength to get the job done.  They are all available over the counter, and you may need to use some, POSSIBLY even all of these options:    Drink plenty of fluids (prune juice may be helpful) and high fiber foods Colace 100 mg by mouth twice a day  Senokot for constipation as directed and as needed Dulcolax (bisacodyl), take with full glass of water  Miralax (polyethylene glycol) once or twice a day as  needed.  If you have tried all these things and are unable to have a bowel movement in the first 3-4 days after surgery call either your surgeon or your primary doctor.    If you experience loose stools or diarrhea, hold the medications until you stool forms back up.  If your symptoms do not get better within 1 week or if they get worse, check with your doctor.  If you experience "the worst abdominal pain ever" or develop nausea or vomiting, please contact the office immediately for further recommendations for treatment.   ITCHING:  If you experience itching with your medications, try taking only a single pain pill, or even half a pain pill at a time.  You can also use Benadryl over the counter for itching or also to help with sleep.   TED HOSE STOCKINGS:  Use stockings on  both legs until for at least 2 weeks or as directed by physician office. They may be removed at night for sleeping.  MEDICATIONS:  See your medication summary on the After Visit Summary that nursing will review with you.  You may have some home medications which will be placed on hold until you complete the course of blood thinner medication.  It is important for you to complete the blood thinner medication as prescribed.  PRECAUTIONS:  If you experience chest pain or shortness of breath - call 911 immediately for transfer to the hospital emergency department.   If you develop a fever greater that 101 F, purulent drainage from wound, increased redness or drainage from wound, foul odor from the wound/dressing, or calf pain - CONTACT YOUR SURGEON.                                                   FOLLOW-UP APPOINTMENTS:  If you do not already have a post-op appointment, please call the office for an appointment to be seen by your surgeon.  Guidelines for how soon to be seen are listed in your After Visit Summary, but are typically between 1-4 weeks after surgery.  MAKE SURE YOU:  Understand these instructions.  Get help  right away if you are not doing well or get worse.    Thank you for letting us be a part of your medical care team.  It is a privilege we respect greatly.  We hope these instructions will help you stay on track for a fast and full recovery!   Information on my medicine - XARELTO (Rivaroxaban)  This medication education was reviewed with me or my healthcare representative as part of my discharge preparation.  The pharmacist that spoke with me during my hospital stay was:  [facility]  Why was Xarelto prescribed for you? Xarelto was prescribed for you to reduce the risk of blood clots forming after orthopedic surgery. The medical term for these abnormal blood clots is venous thromboembolism (VTE).  What do you need to know about xarelto ? Take your Xarelto ONCE DAILY at the same time every day. You may take it either with or without food.  If you have difficulty swallowing the tablet whole, you may crush it and mix in applesauce just prior to taking your dose.  Take Xarelto exactly as prescribed by your doctor and DO NOT stop taking Xarelto without talking to the doctor who prescribed the medication.  Stopping without other VTE prevention medication to take the place of Xarelto may increase your risk of developing a clot.  After discharge, you should have regular check-up appointments with your healthcare provider that is prescribing your Xarelto.    What do you do if you miss a dose? If you miss a dose, take it as soon as you remember on the same day then continue your regularly scheduled once daily regimen the next day. Do not take two doses of Xarelto on the same day.   Important Safety Information A possible side effect of Xarelto is bleeding. You should call your healthcare provider right away if you experience any of the following: ? Bleeding from an injury or your nose that does not stop. ? Unusual colored urine (red or dark brown) or unusual colored stools (red  or black). ? Unusual bruising for unknown  reasons. ? A serious fall or if you hit your head (even if there is no bleeding).  Some medicines may interact with Xarelto and might increase your risk of bleeding while on Xarelto. To help avoid this, consult your healthcare provider or pharmacist prior to using any new prescription or non-prescription medications, including herbals, vitamins, non-steroidal anti-inflammatory drugs (NSAIDs) and supplements.  This website has more information on Xarelto: [email or web address]

## 2014-06-02 NOTE — Progress Notes (Signed)
Patient placed self on CPAP. Patient is and is tolerating well. RT will continue to monitor.

## 2014-06-02 NOTE — Progress Notes (Signed)
Physical Therapy Treatment Patient Details Name: Garrett Mcknight MRN: [id number] DOB: 11/23/1950 Today's Date: 06/02/2014    History of Present Illness Pt is s/p R TKA on 06/01/14. Pt with PMH L5 lumbar surgery, R wrist reconstruction, L rotator cuff repair, sleep apnea and L TKA    PT Comments    Pt progressing well; while self assisting with sheet to flex R knee pt experienced slight popping, not associated with any pain or change in ROM; question if patella tracking issue(?), therapist placed hand over patella and pt flexed knee without further popping; continued to perform I SLR on R, denied pain throughout session. RN aware.  Follow Up Recommendations  Home health PT     Equipment Recommendations  None recommended by PT    Recommendations for Other Services       Precautions / Restrictions Precautions Precautions: Knee Precaution Comments: I SLR with 7* quad lag, KI not used Restrictions Weight Bearing Restrictions: No Other Position/Activity Restrictions: WBAT    Mobility  Bed Mobility Overal bed mobility: Modified Independent Bed Mobility: Supine to Sit     Supine to sit: Modified independent (Device/Increase time);HOB elevated     General bed mobility comments: pt self assists RLE with LLE  Transfers Overall transfer level: Needs assistance Equipment used: Rolling walker (2 wheeled) Transfers: Sit to/from Stand Sit to Stand: Min guard         General transfer comment: verbal cues for hand placement and crutch  Ambulation/Gait Ambulation/Gait assistance: Supervision Ambulation Distance (Feet): 350 Feet Assistive device: Crutches Gait Pattern/deviations: Step-through pattern     General Gait Details: for safety   Stairs            Wheelchair Mobility    Modified Rankin (Stroke Patients Only)       Balance Overall balance assessment: No apparent balance deficits (not formally assessed)                                   Cognition Arousal/Alertness: Awake/alert Behavior During Therapy: WFL for tasks assessed/performed Overall Cognitive Status: Within Functional Limits for tasks assessed                      Exercises Total Joint Exercises Ankle Circles/Pumps: AROM;Both;10 reps Quad Sets: AROM;Both;10 reps Heel Slides: AROM;AAROM;Other (comment);Right;10 reps (pt self assisting with sheet) Straight Leg Raises: AROM;10 reps;Right Knee Flexion: AROM;Right;5 reps;Seated    General Comments        Pertinent Vitals/Pain Pain Assessment: No/denies pain Pain Intervention(s): Monitored during session;Ice applied    Home Living                      Prior Function            PT Goals (current goals can now be found in the care plan section) Acute Rehab PT Goals Patient Stated Goal: home and more walking PT Goal Formulation: With patient Time For Goal Achievement: 06/04/14 Potential to Achieve Goals: Good Progress towards PT goals: Progressing toward goals    Frequency  BID    PT Plan Current plan remains appropriate    Co-evaluation             End of Session Equipment Utilized During Treatment: Gait belt Activity Tolerance: Patient tolerated treatment well Patient left: in chair;with call bell/phone within reach;with family/visitor present     Time: 1359-1434 PT Time Calculation (min) (ACUTE ONLY):  35 min  Charges:  $Gait Training: 8-22 mins $Therapeutic Exercise: 8-22 mins                    G Codes:      Onesty Clair 06/07/14, 2:37 PM

## 2014-06-02 NOTE — Progress Notes (Signed)
Subjective: 1 Day Post-Op Procedure(s) (LRB): RIGHT TOTAL KNEE ARTHROPLASTY (Right) Patient reports pain as 2 on 0-10 scale. Doing well. Hemovac DCd. Will DC tomorrow.   Objective: Vital signs in last 24 hours: Temp:  [97.6 F (36.4 C)-98.9 F (37.2 C)] 98.2 F (36.8 C) (05/25 0554) Pulse Rate:  [58-99] 63 (05/25 0554) Resp:  [10-16] 16 (05/25 0554) BP: (152-206)/(77-110) 152/77 mmHg (05/25 0554) SpO2:  [94 %-100 %] 97 % (05/25 0800) Weight:  [98.884 kg (218 lb)] 98.884 kg (218 lb) (05/24 1549)  Intake/Output from previous day: 05/24 0701 - 05/25 0700 In: 4610 [P.O.:780; I.V.:3780; IV Piggyback:50] Out: 2229 [Urine:4060; Drains:167] Intake/Output this shift: Total I/O In: 240 [P.O.:240] Out: 100 [Urine:100]   Recent Labs  06/02/14 0405  HGB 13.9    Recent Labs  06/02/14 0405  WBC 15.0*  RBC 4.77  HCT 40.5  PLT 212    Recent Labs  06/02/14 0405  NA 137  K 3.9  CL 101  CO2 26  BUN 9  CREATININE 0.65  GLUCOSE 167*  CALCIUM 8.9   No results for input(s): LABPT, INR in the last 72 hours.  Neurovascular intact No cellulitis present  Assessment/Plan: 1 Day Post-Op Procedure(s) (LRB): RIGHT TOTAL KNEE ARTHROPLASTY (Right) Up with therapy  Devonte Migues A 06/02/2014, 10:05 AM

## 2014-06-02 NOTE — Op Note (Signed)
NAMERUE, TINNEL NO.:  [id number]  MEDICAL RECORD NO.:  [id number]  LOCATION:  [place]                         FACILITY:  [facility] [place]  PHYSICIAN:  Kipp Brood. Kealie Barrie, M.D.DATE OF BIRTH:  27-Aug-1950  DATE OF PROCEDURE:  06/01/2014 DATE OF DISCHARGE:                              OPERATIVE REPORT   SURGEON:  Kipp Brood. Gladstone Lighter, M.D.  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  POSTOPERATIVE DIAGNOSIS:  Severe primary osteoarthritis with bone-on- bone and a flexion contracture of the right knee.  OPERATION: 1. Release of flexion contracture, right knee. 2. DePuy total knee arthroplasty utilizing gentamicin in the cement.     The sizes used was a size 4 right femoral component posterior     cruciate sacrificing type, tibial tray was a size 5.  The insert     was a size 4,10 mm thickness rotating platform.  The patella was a     size 41 with 3 pegs.  Gentamicin was used in the cement.  DESCRIPTION OF PROCEDURE:  Under general anesthesia, routine orthopedic prep and draping the right lower extremity was carried out.  The appropriate time-out was first carried out.  I also marked the appropriate right leg in the holding area.  At this time, the leg was exsanguinated with Esmarch, tourniquet was elevated at 325 mmHg.  The knee was placed in the [facility] knee holder.  An anterior approach of the knee was carried out.  Two flaps were created.  Following that, I did a right median parapatellar incision reflected patella laterally and with the knee flexed did medial and lateral meniscectomies.  Incised the anterior posterior cruciate ligaments.  At this time, large spurs from the patella on the femur were removed.  We then made initial drill hole in the intercondylar notch.  At that time, we then utilized the canal finder.  Once we made sure we were in the  canal, I thoroughly irrigated out the canal and inserted my next jig and removed 11 mm thickness off the distal femur at this time.  At that particular time, we then measured the femur to be a size 4 right.  We did do anterior posterior chamfering cuts for a size 4 femur.  Next, attention was directed to the tibia plateau region.  I measured the tibia, the tray measured to be a size 5.  We then made our initial drill hole in the tibial plateau.  The canal finder was inserted.  We then thoroughly irrigated out the canal. At this time, we removed 6 mm thickness off the affected lateral side of the knee.  We then curetted off cartilaginous remaining cartilage over the medial tibial plateau.  Some drill holes were made in the plateau. At this time, we then inserted our lamina spreaders, removed the posterior spurs the femoral condyle.  Following that, we then inserted our spacer blocks and had an excellent fit with the size 10 spacer block.  Following that, we then prepared our tibial side. We continued to prepare the tibia for our keel cut.  We then prepared the femur by making  our notch cut out of the distal femur.  The trial components were cemented. We utilized a 10 mm thickness insert that fit quite nicely. There was still a little tight in extension, but he had rather significant extension and  flexion contracture at that time.  We then did a resurfacing procedure on the patella for a size 41 patella.  Three drill holes were made in the articular surface of the patella.  All trial components then were removed. We thoroughly water picked out the knee dried the knee out, cemented all 3 components in simultaneously with gentamicin in the cement.  We at this time then flexed the knee. After the cement was hardened, we removed all loose pieces of cement. Thoroughly water picked out the knee again to  make sure there were no loose pieces of cement that were posterior.  Following that, I  then inserted my rotating platform size 4,10 mm thickness, reduced the knee, and had nice function.  He had good flexion extension.  We may lack about 5 degrees of extension and as I said before because of severe contracture preop.  We had good medial lateral stability.  We then inserted a Hemovac drain and closed the knee over Hemovac drain ini usual fashion.  Prior to closing the knee I inserted some thrombin- soaked Gelfoam posteriorly.  I also injected a mixture of 20 mL of 0.5% Marcaine epinephrine into the soft tissue and after the deep structures were closed, we then injected the 20 mL of normal saline mixed with 20 mL of Exparel and our tranexamic acid.  Remaining part of the wound was closed in the usual fashion.  Sterile dressings were applied.  Right in the operating room, the patient had 2 g of IV Ancef prior to the procedure.          ______________________________ Kipp Brood. Gladstone Lighter, M.D.     RAG/MEDQ  D:  06/01/2014  T:  06/02/2014  Job:  [id number]

## 2014-06-02 NOTE — Progress Notes (Signed)
RT placed patient on CPAP. Patient home setting is 7.5 cmH2O. Sterile water added to water chamber for humidification. Patient is tolerating well.RT will continue to monitor.

## 2014-06-03 LAB — BASIC METABOLIC PANEL
Anion gap: 11 (ref 5–15)
BUN: 12 mg/dL (ref 6–20)
CALCIUM: 8.7 mg/dL — AB (ref 8.9–10.3)
CO2: 24 mmol/L (ref 22–32)
Chloride: 103 mmol/L (ref 101–111)
Creatinine, Ser: 0.76 mg/dL (ref 0.61–1.24)
GFR calc Af Amer: >60 mL/min (ref >60)
Glucose, Bld: 162 mg/dL — ABNORMAL HIGH (ref 65–99)
POTASSIUM: 3.8 mmol/L (ref 3.5–5.1)
SODIUM: 138 mmol/L (ref 135–145)

## 2014-06-03 LAB — CBC
HCT: 39.2 % (ref 39.0–52.0)
HEMOGLOBIN: 13 g/dL (ref 13.0–17.0)
MCH: 28.4 pg (ref 26.0–34.0)
MCHC: 33.2 g/dL (ref 30.0–36.0)
MCV: 85.6 fL (ref 78.0–100.0)
PLATELETS: 193 10*3/uL (ref 150–400)
RBC: 4.58 MIL/uL (ref 4.22–5.81)
RDW: 13.1 % (ref 11.5–15.5)
WBC: 11.1 10*3/uL — ABNORMAL HIGH (ref 4.0–10.5)

## 2014-06-03 LAB — GLUCOSE, CAPILLARY
Glucose-Capillary: 128 mg/dL — ABNORMAL HIGH (ref 65–99)
Glucose-Capillary: 109 mg/dL — ABNORMAL HIGH (ref 65–99)

## 2014-06-03 MED ORDER — RIVAROXABAN 10 MG PO TABS: 10.0000 mg | ORAL_TABLET | Freq: Every day | ORAL | Status: DC

## 2014-06-03 MED ORDER — OXYCODONE-ACETAMINOPHEN 5-325 MG PO TABS: 1.0000 | ORAL_TABLET | ORAL | Status: DC | PRN

## 2014-06-03 MED ORDER — METHOCARBAMOL 500 MG PO TABS: 500.0000 mg | ORAL_TABLET | Freq: Four times a day (QID) | ORAL | Status: DC | PRN

## 2014-06-03 NOTE — Progress Notes (Signed)
Subjective: 2 Days Post-Op Procedure(s) (LRB): RIGHT TOTAL KNEE ARTHROPLASTY (Right) Patient reports pain as 1 on 0-10 scale. Doing very Well today. Will DC   Objective: Vital signs in last 24 hours: Temp:  [98.5 F (36.9 C)-98.9 F (37.2 C)] 98.7 F (37.1 C) (05/26 0528) Pulse Rate:  [65-75] 75 (05/26 0528) Resp:  [16-20] 16 (05/26 0528) BP: (130-144)/(64-73) 135/73 mmHg (05/26 0528) SpO2:  [95 %-100 %] 99 % (05/26 0528)  Intake/Output from previous day: 05/25 0701 - 05/26 0700 In: 1120 [P.O.:720; I.V.:400] Out: 2050 [Urine:2050] Intake/Output this shift:     Recent Labs  06/02/14 0405 06/03/14 0418  HGB 13.9 13.0    Recent Labs  06/02/14 0405 06/03/14 0418  WBC 15.0* 11.1*  RBC 4.77 4.58  HCT 40.5 39.2  PLT 212 193    Recent Labs  06/02/14 0405 06/03/14 0418  NA 137 138  K 3.9 3.8  CL 101 103  CO2 26 24  BUN 9 12  CREATININE 0.65 0.76  GLUCOSE 167* 162*  CALCIUM 8.9 8.7*   No results for input(s): LABPT, INR in the last 72 hours.  Neurologically intact No cellulitis present Compartment soft  Assessment/Plan: 2 Days Post-Op Procedure(s) (LRB): RIGHT TOTAL KNEE ARTHROPLASTY (Right) Discharge home with home health  Ailis Rigaud A 06/03/2014, 7:13 AM

## 2014-06-03 NOTE — Progress Notes (Signed)
Physical Therapy Treatment Patient Details Name: Garrett Mcknight MRN: [id number] DOB: July 01, 1950 Today's Date: 06/03/2014    History of Present Illness Pt is s/p R TKA on 06/01/14. Pt with PMH L5 lumbar surgery, R wrist reconstruction, L rotator cuff repair, sleep apnea and L TKA    PT Comments    Pt progressing well; still with slight popping in knee with flexion, noted x 1 while pt actively flexing in sitting position; pt denies any pain associated with this; advised pt to bring it to HHPT's attention if it continues and to Dr. Gladstone Lighter at f/u or before if needed.  Follow Up Recommendations  Home health PT     Equipment Recommendations  None recommended by PT    Recommendations for Other Services       Precautions / Restrictions Precautions Precautions: Knee Restrictions Weight Bearing Restrictions: No Other Position/Activity Restrictions: WBAT    Mobility  Bed Mobility Overal bed mobility: Modified Independent Bed Mobility: Supine to Sit;Sit to Supine     Supine to sit: Supervision;Modified independent (Device/Increase time) Sit to supine: Supervision;Modified independent (Device/Increase time)   General bed mobility comments: pt self assists RLE with LLE  Transfers Overall transfer level: Needs assistance Equipment used: Rolling walker (2 wheeled) Transfers: Sit to/from Stand Sit to Stand: Modified independent (Device/Increase time)            Ambulation/Gait Ambulation/Gait assistance: Modified independent (Device/Increase time) Ambulation Distance (Feet): 350 Feet Assistive device: Crutches Gait Pattern/deviations: Step-through pattern         Stairs            Wheelchair Mobility    Modified Rankin (Stroke Patients Only)       Balance                                    Cognition Arousal/Alertness: Awake/alert Behavior During Therapy: WFL for tasks assessed/performed Overall Cognitive Status: Within Functional Limits  for tasks assessed                      Exercises Total Joint Exercises Ankle Circles/Pumps: AROM;Both;10 reps Quad Sets: AROM;Both;10 reps Heel Slides: AROM;10 reps;Other (comment) (pt self assisting with sheet) Hip ABduction/ADduction: AROM;Strengthening;Right;10 reps Straight Leg Raises: AROM;10 reps;Right Knee Flexion: AROM;Right;5 reps;Seated Goniometric ROM: ~ 8 to 95* in sitting AROM    General Comments        Pertinent Vitals/Pain Pain Assessment: 0-10 Pain Score: 1  Pain Location: R knee Pain Descriptors / Indicators: Sore Pain Intervention(s): Limited activity within patient's tolerance;Monitored during session;Premedicated before session    Home Living                      Prior Function            PT Goals (current goals can now be found in the care plan section) Acute Rehab PT Goals Patient Stated Goal: home and more walking PT Goal Formulation: With patient Time For Goal Achievement: 06/04/14 Potential to Achieve Goals: Good Progress towards PT goals: Progressing toward goals    Frequency  BID    PT Plan Current plan remains appropriate    Co-evaluation             End of Session   Activity Tolerance: Patient tolerated treatment well Patient left: in bed;with call bell/phone within reach;with nursing/sitter in room     Time: 3976-7341 PT Time Calculation (min) (  ACUTE ONLY): 29 min  Charges:  $Gait Training: 8-22 mins $Therapeutic Exercise: 8-22 mins                    G Codes:      Nicosha Struve Jun 24, 2014, 10:20 AM

## 2014-06-03 NOTE — Progress Notes (Signed)
Occupational Therapy Treatment Patient Details Name: Garrett Mcknight MRN: [id number] DOB: Jul 29, 1950 Today's Date: 06/03/2014    History of present illness Pt is s/p R TKA on 06/01/14. Pt with PMH L5 lumbar surgery, R wrist reconstruction, L rotator cuff repair, sleep apnea and L TKA   OT comments  All goals met.  Pt does not need any further OT  Follow Up Recommendations  No OT follow up    Equipment Recommendations  None recommended by OT    Recommendations for Other Services      Precautions / Restrictions Precautions Precautions: Knee Restrictions Weight Bearing Restrictions: No Other Position/Activity Restrictions: WBAT       Mobility Bed Mobility         Supine to sit: Modified independent (Device/Increase time);HOB elevated        Transfers   Equipment used: Rolling walker (2 wheeled) Transfers: Sit to/from Stand Sit to Stand: Modified independent (Device/Increase time)              Balance                                   ADL       Grooming: Wash/dry hands;Wash/dry face;Oral care;Standing;Supervision/safety   Upper Body Bathing: Supervision/ safety;Standing   Lower Body Bathing: Supervison/ safety;Sit to/from stand (to knees--wearing ted hose)   Upper Body Dressing : Supervision/safety;Standing       Toilet Transfer: Supervision/safety;Ambulation (back to chair)             General ADL Comments: ambulated to bathroom and performed bathing and grooming from standing. pt steady and safe.  Used urinal in standing.  Simulated tub transfer with grab bar and walker to side step and swing operated leg backwards over tub with min guard.        Vision                     Perception     Praxis      Cognition   Behavior During Therapy: WFL for tasks assessed/performed Overall Cognitive Status: Within Functional Limits for tasks assessed                       Extremity/Trunk Assessment                Exercises     Shoulder Instructions       General Comments      Pertinent Vitals/ Pain       Pain Assessment: No/denies pain  Home Living                                          Prior Functioning/Environment              Frequency       Progress Toward Goals  OT Goals(current goals can now be found in the care plan section)  Progress towards OT goals: Goals met/education completed, patient discharged from [place] of Session     Activity Tolerance Patient tolerated treatment well   Patient Left in chair;with call bell/phone within reach   Nurse Communication  Time: 9249-3241 OT Time Calculation (min): 21 min  Charges: OT General Charges $OT Visit: 1 Procedure OT Treatments $Self Care/Home Management : 8-22 mins  Averill Winters 06/03/2014, 9:22 AM Lesle Chris, OTR/L [phone] 06/03/2014

## 2014-06-04 ENCOUNTER — Ambulatory Visit: Payer: Medicare Other | Admitting: Physical Therapy

## 2014-06-04 DIAGNOSIS — M150 Primary generalized (osteo)arthritis: Secondary | ICD-10-CM | POA: Diagnosis not present

## 2014-06-04 DIAGNOSIS — I10 Essential (primary) hypertension: Secondary | ICD-10-CM | POA: Diagnosis not present

## 2014-06-04 DIAGNOSIS — G4733 Obstructive sleep apnea (adult) (pediatric): Secondary | ICD-10-CM | POA: Diagnosis not present

## 2014-06-04 DIAGNOSIS — Z471 Aftercare following joint replacement surgery: Secondary | ICD-10-CM | POA: Diagnosis not present

## 2014-06-04 DIAGNOSIS — Z96651 Presence of right artificial knee joint: Secondary | ICD-10-CM | POA: Diagnosis not present

## 2014-06-04 DIAGNOSIS — K219 Gastro-esophageal reflux disease without esophagitis: Secondary | ICD-10-CM | POA: Diagnosis not present

## 2014-06-04 DIAGNOSIS — E119 Type 2 diabetes mellitus without complications: Secondary | ICD-10-CM | POA: Diagnosis not present

## 2014-06-04 NOTE — Discharge Summary (Signed)
Physician Discharge Summary   Patient ID: Garrett Mcknight MRN: [id number] DOB/AGE: 1950-12-13 64 y.o.  Admit date: 06/01/2014 Discharge date: 06/03/2014  Primary Diagnosis: Primary osteoarthritis, right knee  Admission Diagnoses:  Past Medical History  Diagnosis Date  . Diabetes   . Seasonal allergies   . Sleep apnea     uses CPAP  . Arthritis   . GERD (gastroesophageal reflux disease)   . Hypertension    Discharge Diagnoses:   Active Problems:   History of total knee arthroplasty  Estimated body mass index is 29.56 kg/(m^2) as calculated from the following:   Height as of this encounter: 6' (1.829 m).   Weight as of this encounter: 98.884 kg (218 lb).  Procedure:  Procedure(s) (LRB): RIGHT TOTAL KNEE ARTHROPLASTY (Right)   Consults: None  HPI: Garrett Mcknight, 64 y.o. male, has a history of pain and functional disability in the right knee due to arthritis and has failed non-surgical conservative treatments for greater than 12 weeks to includeNSAID's and/or analgesics, corticosteriod injections and activity modification. Onset of symptoms was gradual, starting >10 years ago with gradually worsening course since that time. The patient noted prior procedures on the knee to include arthroscopy and menisectomy on the right knee(s). Patient currently rates pain in the right knee(s) at 7 out of 10 with activity. Patient has night pain, worsening of pain with activity and weight bearing, pain that interferes with activities of daily living, pain with passive range of motion, crepitus and joint swelling. Patient has evidence of periarticular osteophytes and joint space narrowing by imaging studies. There is no active infection.  Laboratory Data: Admission on 06/01/2014, Discharged on 06/03/2014  Component Date Value Ref Range Status  . ABO/RH(D) 05/26/2014 O POS   Final  . Glucose-Capillary 06/01/2014 121* 65 - 99 mg/dL Final  . Glucose-Capillary 06/01/2014 261* 65 - 99 mg/dL  Final  . Glucose-Capillary 06/01/2014 171* 65 - 99 mg/dL Final  . Glucose-Capillary 06/01/2014 159* 65 - 99 mg/dL Final  . WBC 06/02/2014 15.0* 4.0 - 10.5 K/uL Final  . RBC 06/02/2014 4.77  4.22 - 5.81 MIL/uL Final  . Hemoglobin 06/02/2014 13.9  13.0 - 17.0 g/dL Final  . HCT 06/02/2014 40.5  39.0 - 52.0 % Final  . MCV 06/02/2014 84.9  78.0 - 100.0 fL Final  . MCH 06/02/2014 29.1  26.0 - 34.0 pg Final  . MCHC 06/02/2014 34.3  30.0 - 36.0 g/dL Final  . RDW 06/02/2014 12.9  11.5 - 15.5 % Final  . Platelets 06/02/2014 212  150 - 400 K/uL Final  . Sodium 06/02/2014 137  135 - 145 mmol/L Final  . Potassium 06/02/2014 3.9  3.5 - 5.1 mmol/L Final  . Chloride 06/02/2014 101  101 - 111 mmol/L Final  . CO2 06/02/2014 26  22 - 32 mmol/L Final  . Glucose, Bld 06/02/2014 167* 65 - 99 mg/dL Final  . BUN 06/02/2014 9  6 - 20 mg/dL Final  . Creatinine, Ser 06/02/2014 0.65  0.61 - 1.24 mg/dL Final  . Calcium 06/02/2014 8.9  8.9 - 10.3 mg/dL Final  . GFR calc non Af Amer 06/02/2014 >60  >60 mL/min Final  . GFR calc Af Amer 06/02/2014 >60  >60 mL/min Final   Comment: (NOTE) The eGFR has been calculated using the CKD EPI equation. This calculation has not been validated in all clinical situations. eGFR's persistently <60 mL/min signify possible Chronic Kidney Disease.   . Anion gap 06/02/2014 10  5 - 15 Final  . Glucose-Capillary  06/01/2014 175* 65 - 99 mg/dL Final  . Glucose-Capillary 06/01/2014 100* 65 - 99 mg/dL Final  . Comment 1 06/01/2014 Document in Chart   Final  . Comment 2 06/01/2014 Call MD NNP PA CNM   Final  . Glucose-Capillary 06/02/2014 147* 65 - 99 mg/dL Final  . Glucose-Capillary 06/02/2014 148* 65 - 99 mg/dL Final  . WBC 06/03/2014 11.1* 4.0 - 10.5 K/uL Final  . RBC 06/03/2014 4.58  4.22 - 5.81 MIL/uL Final  . Hemoglobin 06/03/2014 13.0  13.0 - 17.0 g/dL Final  . HCT 06/03/2014 39.2  39.0 - 52.0 % Final  . MCV 06/03/2014 85.6  78.0 - 100.0 fL Final  . MCH 06/03/2014 28.4  26.0 -  34.0 pg Final  . MCHC 06/03/2014 33.2  30.0 - 36.0 g/dL Final  . RDW 06/03/2014 13.1  11.5 - 15.5 % Final  . Platelets 06/03/2014 193  150 - 400 K/uL Final  . Sodium 06/03/2014 138  135 - 145 mmol/L Final  . Potassium 06/03/2014 3.8  3.5 - 5.1 mmol/L Final  . Chloride 06/03/2014 103  101 - 111 mmol/L Final  . CO2 06/03/2014 24  22 - 32 mmol/L Final  . Glucose, Bld 06/03/2014 162* 65 - 99 mg/dL Final  . BUN 06/03/2014 12  6 - 20 mg/dL Final  . Creatinine, Ser 06/03/2014 0.76  0.61 - 1.24 mg/dL Final  . Calcium 06/03/2014 8.7* 8.9 - 10.3 mg/dL Final  . GFR calc non Af Amer 06/03/2014 >60  >60 mL/min Final  . GFR calc Af Amer 06/03/2014 >60  >60 mL/min Final   Comment: (NOTE) The eGFR has been calculated using the CKD EPI equation. This calculation has not been validated in all clinical situations. eGFR's persistently <60 mL/min signify possible Chronic Kidney Disease.   . Anion gap 06/03/2014 11  5 - 15 Final  . Glucose-Capillary 06/02/2014 150* 65 - 99 mg/dL Final  . Glucose-Capillary 06/02/2014 139* 65 - 99 mg/dL Final  . Glucose-Capillary 06/03/2014 128* 65 - 99 mg/dL Final  . Glucose-Capillary 06/03/2014 109* 65 - 99 mg/dL Final  Hospital Outpatient Visit on 05/26/2014  Component Date Value Ref Range Status  . aPTT 05/26/2014 23* 24 - 37 seconds Final  . MRSA, PCR 05/26/2014 NEGATIVE  NEGATIVE Final  . Staphylococcus aureus 05/26/2014 NEGATIVE  NEGATIVE Final   Comment:        The Xpert SA Assay (FDA approved for NASAL specimens in patients over 74 years of age), is one component of a comprehensive surveillance program.  Test performance has been validated by [facility] for patients greater than or equal to 16 year old. It is not intended to diagnose infection nor to guide or monitor treatment.   . WBC 05/26/2014 7.6  4.0 - 10.5 K/uL Final  . RBC 05/26/2014 5.19  4.22 - 5.81 MIL/uL Final  . Hemoglobin 05/26/2014 15.1  13.0 - 17.0 g/dL Final  . HCT 05/26/2014 44.4   39.0 - 52.0 % Final  . MCV 05/26/2014 85.5  78.0 - 100.0 fL Final  . MCH 05/26/2014 29.1  26.0 - 34.0 pg Final  . MCHC 05/26/2014 34.0  30.0 - 36.0 g/dL Final  . RDW 05/26/2014 13.1  11.5 - 15.5 % Final  . Platelets 05/26/2014 227  150 - 400 K/uL Final  . Neutrophils Relative % 05/26/2014 64  43 - 77 % Final  . Neutro Abs 05/26/2014 4.8  1.7 - 7.7 K/uL Final  . Lymphocytes Relative 05/26/2014 26  12 -  46 % Final  . Lymphs Abs 05/26/2014 1.9  0.7 - 4.0 K/uL Final  . Monocytes Relative 05/26/2014 7  3 - 12 % Final  . Monocytes Absolute 05/26/2014 0.6  0.1 - 1.0 K/uL Final  . Eosinophils Relative 05/26/2014 3  0 - 5 % Final  . Eosinophils Absolute 05/26/2014 0.2  0.0 - 0.7 K/uL Final  . Basophils Relative 05/26/2014 0  0 - 1 % Final  . Basophils Absolute 05/26/2014 0.0  0.0 - 0.1 K/uL Final  . Sodium 05/26/2014 142  135 - 145 mmol/L Final  . Potassium 05/26/2014 4.5  3.5 - 5.1 mmol/L Final  . Chloride 05/26/2014 106  101 - 111 mmol/L Final  . CO2 05/26/2014 28  22 - 32 mmol/L Final  . Glucose, Bld 05/26/2014 109* 65 - 99 mg/dL Final  . BUN 05/26/2014 17  6 - 20 mg/dL Final  . Creatinine, Ser 05/26/2014 0.70  0.61 - 1.24 mg/dL Final  . Calcium 05/26/2014 9.6  8.9 - 10.3 mg/dL Final  . Total Protein 05/26/2014 7.6  6.5 - 8.1 g/dL Final  . Albumin 05/26/2014 4.5  3.5 - 5.0 g/dL Final  . AST 05/26/2014 30  15 - 41 U/L Final  . ALT 05/26/2014 21  17 - 63 U/L Final  . Alkaline Phosphatase 05/26/2014 55  38 - 126 U/L Final  . Total Bilirubin 05/26/2014 0.8  0.3 - 1.2 mg/dL Final  . GFR calc non Af Amer 05/26/2014 >60  >60 mL/min Final  . GFR calc Af Amer 05/26/2014 >60  >60 mL/min Final   Comment: (NOTE) The eGFR has been calculated using the CKD EPI equation. This calculation has not been validated in all clinical situations. eGFR's persistently <60 mL/min signify possible Chronic Kidney Disease.   . Anion gap 05/26/2014 8  5 - 15 Final  . Prothrombin Time 05/26/2014 14.8  11.6 - 15.2  seconds Final  . INR 05/26/2014 1.14  0.00 - 1.49 Final  . ABO/RH(D) 05/26/2014 O POS   Final  . Antibody Screen 05/26/2014 NEG   Final  . Sample Expiration 05/26/2014 06/04/2014   Final  . Color, Urine 05/26/2014 YELLOW  YELLOW Final  . APPearance 05/26/2014 CLEAR  CLEAR Final  . Specific Gravity, Urine 05/26/2014 1.026  1.005 - 1.030 Final  . pH 05/26/2014 5.5  5.0 - 8.0 Final  . Glucose, UA 05/26/2014 NEGATIVE  NEGATIVE mg/dL Final  . Hgb urine dipstick 05/26/2014 NEGATIVE  NEGATIVE Final  . Bilirubin Urine 05/26/2014 NEGATIVE  NEGATIVE Final  . Ketones, ur 05/26/2014 NEGATIVE  NEGATIVE mg/dL Final  . Protein, ur 05/26/2014 NEGATIVE  NEGATIVE mg/dL Final  . Urobilinogen, UA 05/26/2014 0.2  0.0 - 1.0 mg/dL Final  . Nitrite 05/26/2014 NEGATIVE  NEGATIVE Final  . Leukocytes, UA 05/26/2014 NEGATIVE  NEGATIVE Final   MICROSCOPIC NOT DONE ON URINES WITH NEGATIVE PROTEIN, BLOOD, LEUKOCYTES, NITRITE, OR GLUCOSE <1000 mg/dL.     X-Rays:Dg Chest 2 View  05/26/2014   CLINICAL DATA:  Preoperative exam prior to total knee joint replacement ; history of sleep apnea and gastroesophageal reflux  EXAM: CHEST  2 VIEW  COMPARISON:  None  FINDINGS: The lungs are adequately inflated and clear. The heart and pulmonary vascularity are normal. The mediastinum is normal in width. There is no pleural effusion. There is calcification of the anterior longitudinal ligament of the thoracic spine. The thoracic vertebral bodies are preserved in height. There is an old fracture of the midshaft of the right clavicle.  IMPRESSION: There is no active cardiopulmonary disease.   Electronically Signed   By: Mete  Martinique M.D.   On: 05/26/2014 17:06    Hospital Course: Garrett Mcknight is a 64 y.o. who was admitted to [facility]. They were brought to the operating room on 06/01/2014 and underwent Procedure(s): RIGHT TOTAL KNEE ARTHROPLASTY.  Patient tolerated the procedure well and was later transferred to the recovery  room and then to the orthopaedic floor for postoperative care.  They were given PO and IV analgesics for pain control following their surgery.  They were given 24 hours of postoperative antibiotics of  Anti-infectives    Start     Dose/Rate Route Frequency Ordered Stop   06/01/14 1830  ceFAZolin (ANCEF) IVPB 1 g/50 mL premix     1 g 100 mL/hr over 30 Minutes Intravenous Every 6 hours 06/01/14 1603 06/02/14 0155   06/01/14 1316  polymyxin B 500,000 Units, bacitracin 50,000 Units in sodium chloride irrigation 0.9 % 500 mL irrigation  Status:  Discontinued       As needed 06/01/14 1316 06/01/14 1454   06/01/14 0927  ceFAZolin (ANCEF) IVPB 2 g/50 mL premix     2 g 100 mL/hr over 30 Minutes Intravenous On call to O.R. 06/01/14 8338 06/01/14 1241     and started on DVT prophylaxis in the form of Xarelto.   PT and OT were ordered for total joint protocol.  Discharge planning consulted to help with postop disposition and equipment needs.  Patient had a good night on the evening of surgery.  They started to get up OOB with therapy on day one.  Patient was seen in rounds and was ready to go home.   Diet: Cardiac diet Activity:WBAT Follow-up:in 2 weeks Disposition - Home Discharged Condition: stable   Discharge Instructions    Call MD / Call 911    Complete by:  As directed   If you experience chest pain or shortness of breath, CALL 911 and be transported to the hospital emergency room.  If you develope a fever above 101 F, pus (white drainage) or increased drainage or redness at the wound, or calf pain, call your surgeon's office.     Constipation Prevention    Complete by:  As directed   Drink plenty of fluids.  Prune juice may be helpful.  You may use a stool softener, such as Colace (over the counter) 100 mg twice a day.  Use MiraLax (over the counter) for constipation as needed.     Diet - low sodium heart healthy    Complete by:  As directed      Discharge instructions    Complete by:  As  directed   INSTRUCTIONS AFTER JOINT REPLACEMENT   Remove items at home which could result in a fall. This includes throw rugs or furniture in walking pathways ICE to the affected joint every three hours while awake for 30 minutes at a time, for at least the first 3-5 days, and then as needed for pain and swelling.  Continue to use ice for pain and swelling. You may notice swelling that will progress down to the foot and ankle.  This is normal after surgery.  Elevate your leg when you are not up walking on it.   Continue to use the breathing machine you got in the hospital (incentive spirometer) which will help keep your temperature down.  It is common for your temperature to cycle up and down following surgery, especially at night  when you are not up moving around and exerting yourself.  The breathing machine keeps your lungs expanded and your temperature down.   DIET:  As you were doing prior to hospitalization, we recommend a well-balanced diet.  DRESSING / WOUND CARE / SHOWERING  Keep the surgical dressing until follow up.  The dressing is water proof, so you can shower without any extra covering.  IF THE DRESSING FALLS OFF or the wound gets wet inside, change the dressing with sterile gauze.  Please use good hand washing techniques before changing the dressing.  Do not use any lotions or creams on the incision until instructed by your surgeon.    ACTIVITY  Increase activity slowly as tolerated, but follow the weight bearing instructions below.   No driving for 6 weeks or until further direction given by your physician.  You cannot drive while taking narcotics.  No lifting or carrying greater than 10 lbs. until further directed by your surgeon. Avoid periods of inactivity such as sitting longer than an hour when not asleep. This helps prevent blood clots.  You may return to work once you are authorized by your doctor.     WEIGHT BEARING   Weight bearing as tolerated with assist device  (walker, cane, etc) as directed, use it as long as suggested by your surgeon or therapist, typically at least 4-6 weeks.   EXERCISES  Results after joint replacement surgery are often greatly improved when you follow the exercise, range of motion and muscle strengthening exercises prescribed by your doctor. Safety measures are also important to protect the joint from further injury. Any time any of these exercises cause you to have increased pain or swelling, decrease what you are doing until you are comfortable again and then slowly increase them. If you have problems or questions, call your caregiver or physical therapist for advice.   Rehabilitation is important following a joint replacement. After just a few days of immobilization, the muscles of the leg can become weakened and shrink (atrophy).  These exercises are designed to build up the tone and strength of the thigh and leg muscles and to improve motion. Often times heat used for twenty to thirty minutes before working out will loosen up your tissues and help with improving the range of motion but do not use heat for the first two weeks following surgery (sometimes heat can increase post-operative swelling).   These exercises can be done on a training (exercise) mat, on the floor, on a table or on a bed. Use whatever works the best and is most comfortable for you.    Use music or television while you are exercising so that the exercises are a pleasant break in your day. This will make your life better with the exercises acting as a break in your routine that you can look forward to.   Perform all exercises about fifteen times, three times per day or as directed.  You should exercise both the operative leg and the other leg as well.   Exercises include:   Quad Sets - Tighten up the muscle on the front of the thigh (Quad) and hold for 5-10 seconds.   Straight Leg Raises - With your knee straight (if you were given a brace, keep it on), lift the  leg to 60 degrees, hold for 3 seconds, and slowly lower the leg.  Perform this exercise against resistance later as your leg gets stronger.  Leg Slides: Lying on your back, slowly slide your foot  toward your buttocks, bending your knee up off the floor (only go as far as is comfortable). Then slowly slide your foot back down until your leg is flat on the floor again.  Angel Wings: Lying on your back spread your legs to the side as far apart as you can without causing discomfort.  Hamstring Strength:  Lying on your back, push your heel against the floor with your leg straight by tightening up the muscles of your buttocks.  Repeat, but this time bend your knee to a comfortable angle, and push your heel against the floor.  You may put a pillow under the heel to make it more comfortable if necessary.   A rehabilitation program following joint replacement surgery can speed recovery and prevent re-injury in the future due to weakened muscles. Contact your doctor or a physical therapist for more information on knee rehabilitation.    CONSTIPATION  Constipation is defined medically as fewer than three stools per week and severe constipation as less than one stool per week.  Even if you have a regular bowel pattern at home, your normal regimen is likely to be disrupted due to multiple reasons following surgery.  Combination of anesthesia, postoperative narcotics, change in appetite and fluid intake all can affect your bowels.   YOU MUST use at least one of the following options; they are listed in order of increasing strength to get the job done.  They are all available over the counter, and you may need to use some, POSSIBLY even all of these options:    Drink plenty of fluids (prune juice may be helpful) and high fiber foods Colace 100 mg by mouth twice a day  Senokot for constipation as directed and as needed Dulcolax (bisacodyl), take with full glass of water  Miralax (polyethylene glycol) once or twice  a day as needed.  If you have tried all these things and are unable to have a bowel movement in the first 3-4 days after surgery call either your surgeon or your primary doctor.    If you experience loose stools or diarrhea, hold the medications until you stool forms back up.  If your symptoms do not get better within 1 week or if they get worse, check with your doctor.  If you experience "the worst abdominal pain ever" or develop nausea or vomiting, please contact the office immediately for further recommendations for treatment.   ITCHING:  If you experience itching with your medications, try taking only a single pain pill, or even half a pain pill at a time.  You can also use Benadryl over the counter for itching or also to help with sleep.   TED HOSE STOCKINGS:  Use stockings on both legs until for at least 2 weeks or as directed by physician office. They may be removed at night for sleeping.  MEDICATIONS:  See your medication summary on the "After Visit Summary" that nursing will review with you.  You may have some home medications which will be placed on hold until you complete the course of blood thinner medication.  It is important for you to complete the blood thinner medication as prescribed.  PRECAUTIONS:  If you experience chest pain or shortness of breath - call 911 immediately for transfer to the hospital emergency department.   If you develop a fever greater that 101 F, purulent drainage from wound, increased redness or drainage from wound, foul odor from the wound/dressing, or calf pain - CONTACT YOUR SURGEON.  FOLLOW-UP APPOINTMENTS:  If you do not already have a post-op appointment, please call the office for an appointment to be seen by your surgeon.  Guidelines for how soon to be seen are listed in your "After Visit Summary", but are typically between 1-4 weeks after surgery.  MAKE SURE YOU:  Understand these instructions.  Get  help right away if you are not doing well or get worse.    Thank you for letting us be a part of your medical care team.  It is a privilege we respect greatly.  We hope these instructions will help you stay on track for a fast and full recovery!     Increase activity slowly as tolerated    Complete by:  As directed             Medication List    STOP taking these medications        diclofenac 75 MG EC tablet  Commonly known as:  VOLTAREN     multivitamin with minerals Tabs tablet     naproxen 250 MG tablet  Commonly known as:  NAPROSYN     VITAMIN C PO     VITAMIN D PO      TAKE these medications        fexofenadine 180 MG tablet  Commonly known as:  ALLEGRA  Take 180 mg by mouth every morning.     fluticasone 27.5 MCG/SPRAY nasal spray  Commonly known as:  VERAMYST  Place 1 spray into the nose 2 (two) times daily.     lisinopril 10 MG tablet  Commonly known as:  PRINIVIL,ZESTRIL  Take 1 tablet (10 mg total) by mouth daily.     metFORMIN 1000 MG tablet  Commonly known as:  GLUCOPHAGE  Take 1 tablet (1,000 mg total) by mouth 2 (two) times daily with a meal.     methocarbamol 500 MG tablet  Commonly known as:  ROBAXIN  Take 1 tablet (500 mg total) by mouth every 6 (six) hours as needed for muscle spasms.     oxyCODONE-acetaminophen 5-325 MG per tablet  Commonly known as:  PERCOCET/ROXICET  Take 1-2 tablets by mouth every 4 (four) hours as needed for moderate pain.     pregabalin 150 MG capsule  Commonly known as:  LYRICA  Take 1 capsule (150 mg total) by mouth 2 (two) times daily.     rivaroxaban 10 MG Tabs tablet  Commonly known as:  XARELTO  Take 1 tablet (10 mg total) by mouth daily with breakfast.  Notes to Patient:  Blood thinner     sildenafil 20 MG tablet  Commonly known as:  REVATIO  Take 2-5 as needed for sexual activity     VISINE OP  Apply 1-2 drops to eye daily as needed (dry eyes.).           Follow-up Information    Follow up with  [facility].   Why:  home health physical therapy   Contact information:   [place] [address] [phone]       Follow up with GIOFFRE,RONALD A, MD. Schedule an appointment as soon as possible for a visit in 2 weeks.   Specialty:  Orthopedic Surgery   Contact information:   [address] [place] [address] [phone]       Signed: Ardeen Jourdain, PA-C Orthopaedic Surgery 06/04/2014, 8:53 AM

## 2014-06-07 DIAGNOSIS — K219 Gastro-esophageal reflux disease without esophagitis: Secondary | ICD-10-CM | POA: Diagnosis not present

## 2014-06-07 DIAGNOSIS — E119 Type 2 diabetes mellitus without complications: Secondary | ICD-10-CM | POA: Diagnosis not present

## 2014-06-07 DIAGNOSIS — I10 Essential (primary) hypertension: Secondary | ICD-10-CM | POA: Diagnosis not present

## 2014-06-07 DIAGNOSIS — Z96651 Presence of right artificial knee joint: Secondary | ICD-10-CM | POA: Diagnosis not present

## 2014-06-07 DIAGNOSIS — Z471 Aftercare following joint replacement surgery: Secondary | ICD-10-CM | POA: Diagnosis not present

## 2014-06-07 DIAGNOSIS — M150 Primary generalized (osteo)arthritis: Secondary | ICD-10-CM | POA: Diagnosis not present

## 2014-06-09 DIAGNOSIS — M150 Primary generalized (osteo)arthritis: Secondary | ICD-10-CM | POA: Diagnosis not present

## 2014-06-09 DIAGNOSIS — Z471 Aftercare following joint replacement surgery: Secondary | ICD-10-CM | POA: Diagnosis not present

## 2014-06-09 DIAGNOSIS — I10 Essential (primary) hypertension: Secondary | ICD-10-CM | POA: Diagnosis not present

## 2014-06-09 DIAGNOSIS — K219 Gastro-esophageal reflux disease without esophagitis: Secondary | ICD-10-CM | POA: Diagnosis not present

## 2014-06-09 DIAGNOSIS — E119 Type 2 diabetes mellitus without complications: Secondary | ICD-10-CM | POA: Diagnosis not present

## 2014-06-09 DIAGNOSIS — Z96651 Presence of right artificial knee joint: Secondary | ICD-10-CM | POA: Diagnosis not present

## 2014-06-11 DIAGNOSIS — K219 Gastro-esophageal reflux disease without esophagitis: Secondary | ICD-10-CM | POA: Diagnosis not present

## 2014-06-11 DIAGNOSIS — I10 Essential (primary) hypertension: Secondary | ICD-10-CM | POA: Diagnosis not present

## 2014-06-11 DIAGNOSIS — E119 Type 2 diabetes mellitus without complications: Secondary | ICD-10-CM | POA: Diagnosis not present

## 2014-06-11 DIAGNOSIS — Z471 Aftercare following joint replacement surgery: Secondary | ICD-10-CM | POA: Diagnosis not present

## 2014-06-11 DIAGNOSIS — M150 Primary generalized (osteo)arthritis: Secondary | ICD-10-CM | POA: Diagnosis not present

## 2014-06-11 DIAGNOSIS — Z96651 Presence of right artificial knee joint: Secondary | ICD-10-CM | POA: Diagnosis not present

## 2014-06-14 ENCOUNTER — Ambulatory Visit: Payer: Medicare Other | Admitting: Physical Therapy

## 2014-06-14 DIAGNOSIS — E119 Type 2 diabetes mellitus without complications: Secondary | ICD-10-CM | POA: Diagnosis not present

## 2014-06-14 DIAGNOSIS — I10 Essential (primary) hypertension: Secondary | ICD-10-CM | POA: Diagnosis not present

## 2014-06-14 DIAGNOSIS — K219 Gastro-esophageal reflux disease without esophagitis: Secondary | ICD-10-CM | POA: Diagnosis not present

## 2014-06-14 DIAGNOSIS — M150 Primary generalized (osteo)arthritis: Secondary | ICD-10-CM | POA: Diagnosis not present

## 2014-06-14 DIAGNOSIS — Z96651 Presence of right artificial knee joint: Secondary | ICD-10-CM | POA: Diagnosis not present

## 2014-06-14 DIAGNOSIS — Z471 Aftercare following joint replacement surgery: Secondary | ICD-10-CM | POA: Diagnosis not present

## 2014-06-16 ENCOUNTER — Ambulatory Visit: Payer: Medicare Other | Attending: Orthopedic Surgery | Admitting: Physical Therapy

## 2014-06-16 DIAGNOSIS — M25661 Stiffness of right knee, not elsewhere classified: Secondary | ICD-10-CM | POA: Diagnosis not present

## 2014-06-16 DIAGNOSIS — M25561 Pain in right knee: Secondary | ICD-10-CM | POA: Insufficient documentation

## 2014-06-16 NOTE — Therapy (Signed)
[facility] [place], [place], [address] Phone: [phone]   Fax:  [phone]  Physical Therapy Evaluation  Patient Details  Name: Garrett Mcknight MRN: [id number] Date of Birth: October 09, 1950 Referring Provider:  Latanya Maudlin, MD  Encounter Date: 06/16/2014      PT End of Session - 06/16/14 0946    Visit Number 1   Number of Visits 12   Date for PT Re-Evaluation 08/11/14   PT Start Time 0904   PT Stop Time 0950   PT Time Calculation (min) 46 min   Activity Tolerance Patient tolerated treatment well   Behavior During Therapy [facility] for tasks assessed/performed      Past Medical History  Diagnosis Date  . Diabetes   . Seasonal allergies   . Sleep apnea     uses CPAP  . Arthritis   . GERD (gastroesophageal reflux disease)   . Hypertension     Past Surgical History  Procedure Laterality Date  . L5 lumbar surgery    . Rhinophyma resection    . Right knee meniscus    . Right wrist reconstruction    . Left knee acl repaired    . Left knee replacement    . Left bicep tendoesis    . Left rotary cuff repair    . Fibroma      removed from scalp 35 years ago  . Total knee arthroplasty Right 06/01/2014    Procedure: RIGHT TOTAL KNEE ARTHROPLASTY;  Surgeon: Latanya Maudlin, MD;  Location: [facility];  Service: Orthopedics;  Laterality: Right;    There were no vitals filed for this visit.  Visit Diagnosis:  Right knee pain - Plan: PT plan of care cert/re-cert  Knee stiffness, right - Plan: PT plan of care cert/re-cert      Subjective Assessment - 06/16/14 0926    Subjective Been having a rough time with this knee.  Getting sutures taken out Friday(06/18/14).   Limitations Sitting;Standing;Walking   How long can you sit comfortably? 15-20 minutes.   How long can you stand comfortably? 15-20 minutes.   How long can you walk comfortably? 15-20 minutes.   Patient Stated Goals Get out of pain and back to normal life.    Pain Score 2    Pain Location Knee   Pain Orientation Right   Pain Descriptors / Indicators Aching   Pain Type Surgical pain   Pain Frequency Intermittent            [facility] PT Assessment - 06/16/14 0001    Assessment   Medical Diagnosis Right total knee arthroplasty.   Onset Date/Surgical Date --  06/01/14.   Next MD Visit --  06/18/14.   Precautions   Precautions --  Bee sting allergy.  No ultrasound.   Balance Screen   Has the patient fallen in the past 6 months No   Has the patient had a decrease in activity level because of a fear of falling?  No   Is the patient reluctant to leave their home because of a fear of falling?  No   Home Ecologist residence   Prior Function   Level of Independence Independent   Cognition   Overall Cognitive Status Within Functional Limits for tasks assessed   Observation/Other Assessments   Observations Post-surgical dresing still intact.   Observation/Other Assessments-Edema    Edema Circumferential   Circumferential Edema   Circumferential - Right --  3.5cms > on right.  ROM / Strength   AROM / PROM / Strength AROM;Strength   AROM   Overall AROM Comments -30 to 65 degrees on right.   Strength   Overall Strength Comments Right hip= 3+/5 and right knee 3+/5.   Palpation   Palpation comment Very tender even to light palpation diffusely around patient's right knee and both medially and laterally.   Ambulation/Gait   Gait Comments Patient is currently ambulating wiht bilateral axillary crutches.                   [facility] Adult PT Treatment/Exercise - 07-01-14 0001    Modalities   Modalities Cryotherapy;Electrical Stimulation   Cryotherapy   Number Minutes Cryotherapy 15 Minutes   Cryotherapy Location --  Right knee.   Type of Cryotherapy --  Medium vasopneumatic.   Acupuncturist Location Right knee.   Electrical Stimulation Action 1-10 HZ IFC x 15  minutes.   Electrical Stimulation Goals Edema;Pain                  PT Short Term Goals - 07-01-14 0950    PT SHORT TERM GOAL #1   Title Ind with initial HEP.   Time 2   Period Weeks   Status New           PT Long Term Goals - 07-01-14 0950    PT LONG TERM GOAL #1   Title Ind with an advanced HEP.   Time 4   Period Weeks   Status New   PT LONG TERM GOAL #2   Title Achieve full right knee extension to normalize gait.   Time 4   Status New   PT LONG TERM GOAL #3   Title Achieve active right knee flexion to 115 degrees+ to increase function.   Time 4   Period Weeks   Status New   PT LONG TERM GOAL #4   Title Perform a reciprocating stair gait with one railing.   Time 4   Period Weeks   Status New   PT LONG TERM GOAL #5   Title Walk a community distance without assistive device and pain not > 3/10.   Time 4   Period Weeks   Status New               Plan - 07/01/2014 0946    Clinical Impression Statement The patient underwent a right total knee replacement on 06/01/14.  He will have has post-surgical dressing removed Friday (06/18/14).  His resting pain-level is a 2/10 but much higher (7-7+/10) with movement.  He had home health physical therapy and is compliant to his HEP.     Pt will benefit from skilled therapeutic intervention in order to improve on the following deficits Abnormal gait;Decreased activity tolerance;Decreased strength;Increased edema;Pain   Rehab Potential Good   PT Frequency 3x / week   PT Duration 4 weeks   PT Treatment/Interventions ADLs/Self Care Home Management;Cryotherapy;Occupational psychologist;Therapeutic activities;Therapeutic exercise;Neuromuscular re-education;Patient/family education;Manual techniques;Passive range of motion;Vasopneumatic Device   PT Next Visit Plan Nustep; work hard on getting full right knee extension.  TKR protocol.   Consulted and Agree with Plan of Care Patient           G-Codes - 07/01/14 1740    Functional Assessment Tool Used FOTO.   Functional Limitation Mobility: Walking and moving around   Mobility: Walking and Moving Around Current Status [phone]) At least 60 percent but less than 80 percent impaired, limited  or restricted   Mobility: Walking and Moving Around Goal Status [phone]) At least 1 percent but less than 20 percent impaired, limited or restricted       Problem List Patient Active Problem List   Diagnosis Date Noted  . 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 Obstructive sleep apnea 04/29/2014    Teofil Maniaci, [place] MPT 06/16/2014, 9:55 AM  [facility] [address] [place], [place], [address] Phone: [phone]   Fax:  [phone]

## 2014-06-17 ENCOUNTER — Encounter (HOSPITAL_BASED_OUTPATIENT_CLINIC_OR_DEPARTMENT_OTHER): Payer: Medicare Other

## 2014-06-18 DIAGNOSIS — Z96651 Presence of right artificial knee joint: Secondary | ICD-10-CM | POA: Diagnosis not present

## 2014-06-18 DIAGNOSIS — Z471 Aftercare following joint replacement surgery: Secondary | ICD-10-CM | POA: Diagnosis not present

## 2014-06-21 ENCOUNTER — Ambulatory Visit: Payer: Medicare Other | Admitting: *Deleted

## 2014-06-21 ENCOUNTER — Encounter: Payer: Self-pay | Admitting: *Deleted

## 2014-06-21 DIAGNOSIS — M25661 Stiffness of right knee, not elsewhere classified: Secondary | ICD-10-CM

## 2014-06-21 DIAGNOSIS — M25561 Pain in right knee: Secondary | ICD-10-CM | POA: Diagnosis not present

## 2014-06-21 NOTE — Therapy (Signed)
[facility] [place], [place], [address] Phone: [phone]   Fax:  [phone]  Physical Therapy Treatment  Patient Details  Name: Garrett Mcknight MRN: [id number] Date of Birth: 1950-04-08 Referring Provider:  [name], MD  Encounter Date: 06/21/2014      PT End of Session - 06/21/14 0825    Visit Number 2   Number of Visits 12   Date for PT Re-Evaluation 08/11/14   PT Start Time 0818   PT Stop Time 0916   PT Time Calculation (min) 58 min      Past Medical History  Diagnosis Date  . Diabetes   . Seasonal allergies   . Sleep apnea     uses CPAP  . Arthritis   . GERD (gastroesophageal reflux disease)   . Hypertension     Past Surgical History  Procedure Laterality Date  . L5 lumbar surgery    . Rhinophyma resection    . Right knee meniscus    . Right wrist reconstruction    . Left knee acl repaired    . Left knee replacement    . Left bicep tendoesis    . Left rotary cuff repair    . Fibroma      removed from scalp 35 years ago  . Total knee arthroplasty Right 06/01/2014    Procedure: RIGHT TOTAL KNEE ARTHROPLASTY;  Surgeon: Latanya Maudlin, MD;  Location: [facility];  Service: Orthopedics;  Laterality: Right;    There were no vitals filed for this visit.  Visit Diagnosis:  Right knee pain  Knee stiffness, right      Subjective Assessment - 06/21/14 0822    Subjective Went to MD on Friday and said everything looked good. RT knee keeps me awake at night.   Limitations Sitting;Standing;Walking   How long can you stand comfortably? 15-20 minutes.   How long can you walk comfortably? 15-20 minutes.   Patient Stated Goals Get out of pain and back to normal life.   Pain Score 5    Pain Location Knee   Pain Orientation Right   Pain Descriptors / Indicators Aching   Pain Type Surgical pain   Pain Frequency Intermittent                         [facility] Adult PT Treatment/Exercise -  06/21/14 0001    Exercises   Exercises Knee/Hip   Knee/Hip Exercises: Aerobic   Stationary Bike --  Nustep L4 x 15 min. Seat progression for flexion ROM 15,14   Knee/Hip Exercises: Standing   Forward Step Up 3 sets;10 reps;Step Height: 6"   Rocker Board 3 minutes  Calf stretching   Other Standing Knee Exercises 14 in box flexion stretching   Cryotherapy   Number Minutes Cryotherapy 15 Minutes   Cryotherapy Location --  Right knee.   Type of Cryotherapy Ice pack  Vasopnuematic   Electrical Stimulation   Electrical Stimulation Location Right knee.   Electrical Stimulation Action 1-10hz  x15 mins   Electrical Stimulation Goals Edema;Pain   Manual Therapy   Manual Therapy Passive ROM;Soft tissue mobilization   Soft tissue mobilization scar mobs   Passive ROM PROM for flexion/extension in supine RT knee                  PT Short Term Goals - 06/16/14 0950    PT SHORT TERM GOAL #1   Title Ind with initial HEP.   Time 2  Period Weeks   Status New           PT Long Term Goals - 06/16/14 0950    PT LONG TERM GOAL #1   Title Ind with an advanced HEP.   Time 4   Period Weeks   Status New   PT LONG TERM GOAL #2   Title Achieve full right knee extension to normalize gait.   Time 4   Status New   PT LONG TERM GOAL #3   Title Achieve active right knee flexion to 115 degrees+ to increase function.   Time 4   Period Weeks   Status New   PT LONG TERM GOAL #4   Title Perform a reciprocating stair gait with one railing.   Time 4   Period Weeks   Status New   PT LONG TERM GOAL #5   Title Walk a community distance without assistive device and pain not > 3/10.   Time 4   Period Weeks   Status New               Plan - 06/21/14 0827    Clinical Impression Statement Pt did fairly well with Rx,but was still guarded with movements due to pain RT knee. He is ambulating with one crutch now and is weaning to no AD. Sx was 06-01-14   Pt will benefit from skilled  therapeutic intervention in order to improve on the following deficits Abnormal gait;Decreased activity tolerance;Decreased strength;Increased edema;Pain   Rehab Potential Good   PT Frequency 3x / week   PT Duration 4 weeks   PT Treatment/Interventions ADLs/Self Care Home Management;Cryotherapy;Occupational psychologist;Therapeutic activities;Therapeutic exercise;Neuromuscular re-education;Patient/family education;Manual techniques;Passive range of motion;Vasopneumatic Device   PT Next Visit Plan Nustep; work hard on getting full right knee extension.  TKR protocol.        Problem List Patient Active Problem List   Diagnosis Date Noted  . 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 Obstructive sleep apnea 04/29/2014    RAMSEUR,CHRIS, PTA 06/21/2014, 9:23 AM  [facility] [address] [place], [place], [address] Phone: [phone]   Fax:  [phone]

## 2014-06-23 ENCOUNTER — Encounter: Payer: Self-pay | Admitting: Physical Therapy

## 2014-06-23 ENCOUNTER — Ambulatory Visit: Payer: Medicare Other | Admitting: Physical Therapy

## 2014-06-23 DIAGNOSIS — M25561 Pain in right knee: Secondary | ICD-10-CM

## 2014-06-23 DIAGNOSIS — M25661 Stiffness of right knee, not elsewhere classified: Secondary | ICD-10-CM

## 2014-06-23 NOTE — Therapy (Signed)
[facility] [place], [place], [address] Phone: [phone]   Fax:  [phone]  Physical Therapy Treatment  Patient Details  Name: Garrett Mcknight MRN: [id number] Date of Birth: 1950/09/20 Referring Provider:  [name], MD  Encounter Date: 06/23/2014      PT End of Session - 06/23/14 0906    Visit Number 3   Number of Visits 12   Date for PT Re-Evaluation 08/11/14   PT Start Time 0815   PT Stop Time 0914   PT Time Calculation (min) 59 min   Activity Tolerance Patient tolerated treatment well   Behavior During Therapy [facility] for tasks assessed/performed      Past Medical History  Diagnosis Date  . Diabetes   . Seasonal allergies   . Sleep apnea     uses CPAP  . Arthritis   . GERD (gastroesophageal reflux disease)   . Hypertension     Past Surgical History  Procedure Laterality Date  . L5 lumbar surgery    . Rhinophyma resection    . Right knee meniscus    . Right wrist reconstruction    . Left knee acl repaired    . Left knee replacement    . Left bicep tendoesis    . Left rotary cuff repair    . Fibroma      removed from scalp 35 years ago  . Total knee arthroplasty Right 06/01/2014    Procedure: RIGHT TOTAL KNEE ARTHROPLASTY;  Surgeon: Latanya Maudlin, MD;  Location: [facility];  Service: Orthopedics;  Laterality: Right;    There were no vitals filed for this visit.  Visit Diagnosis:  Right knee pain  Knee stiffness, right      Subjective Assessment - 06/23/14 0825    Subjective stiffness in right knee today   Limitations Sitting;Standing;Walking   How long can you sit comfortably? 15-20 minutes.   How long can you stand comfortably? 15-20 minutes.   How long can you walk comfortably? 15-20 minutes.   Patient Stated Goals Get out of pain and back to normal life.   Currently in Pain? Yes   Pain Score 5    Pain Location Knee   Pain Orientation Right   Pain Descriptors / Indicators Aching   stiff   Pain Type Surgical pain   Pain Frequency Intermittent   Aggravating Factors  increased activity or ROM   Pain Relieving Factors rest            [facility] PT Assessment - 06/23/14 0001    ROM / Strength   AROM / PROM / Strength AROM;PROM   AROM   Overall AROM  Within functional limits for tasks performed   AROM Assessment Site Knee   Right/Left Knee Right   Right Knee Extension -18   Right Knee Flexion 70   PROM   Overall PROM  Within functional limits for tasks performed   Overall PROM Comments -14-83   PROM Assessment Site Knee   Right/Left Knee Right   Strength   Overall Strength --                     [facility] Adult PT Treatment/Exercise - 06/23/14 0001    Knee/Hip Exercises: Stretches   Knee: Self-Stretch to increase Flexion 3 reps;30 seconds   Knee/Hip Exercises: Aerobic   Stationary Bike Nustep L4 16min, adjsted seat for ROM   Knee/Hip Exercises: Standing   Rocker Board 1 minute   Cryotherapy   Number  Minutes Cryotherapy 15 Minutes   Cryotherapy Location Knee   Type of Cryotherapy --  vasopnumatic   Electrical Stimulation   Electrical Stimulation Location Right knee.   Electrical Stimulation Action 1-10Hz    Electrical Stimulation Parameters premod   Electrical Stimulation Goals Edema;Pain   Manual Therapy   Manual Therapy Passive ROM;Soft tissue mobilization   Passive ROM PROM for flexion/extension in supine RT knee                  PT Short Term Goals - 06/23/14 0909    PT SHORT TERM GOAL #1   Title Ind with initial HEP.   Time 2   Period Weeks   Status Achieved           PT Long Term Goals - 06/23/14 0909    PT LONG TERM GOAL #1   Title Ind with an advanced HEP.   Time 4   Period Weeks   Status On-going   PT LONG TERM GOAL #2   Title Achieve full right knee extension to normalize gait.   Time 4   Period Weeks   Status On-going   PT LONG TERM GOAL #3   Title Achieve active right knee flexion to 115 degrees+  to increase function.   Time 4   Period Weeks   Status On-going   PT LONG TERM GOAL #4   Title Perform a reciprocating stair gait with one railing.   Time 4   Period Weeks   Status On-going   PT LONG TERM GOAL #5   Title Walk a community distance without assistive device and pain not > 3/10.   Time 4   Period Weeks   Status On-going               Plan - 06/23/14 0907    Clinical Impression Statement Patient tolerated treatment well, although very stiff and painful right knee. Patient not stretching knee as much as he should due to pain and unable to sleep more than an hour at a time. HEP given for self stretches. Met STG #1 LTG's ongoing due to pain and ROM limitations.   Pt will benefit from skilled therapeutic intervention in order to improve on the following deficits Abnormal gait;Decreased activity tolerance;Decreased strength;Increased edema;Pain   Rehab Potential Good   PT Frequency 3x / week   PT Duration 4 weeks   PT Treatment/Interventions ADLs/Self Care Home Management;Cryotherapy;0908;Therapeutic activities;Therapeutic exercise;Neuromuscular re-education;Patient/family education;Manual techniques;Passive range of motion;Vasopneumatic Device   PT Next Visit Plan Nustep; work hard on getting full right knee extension.  TKR protocol.   Consulted and Agree with Plan of Care Patient        Problem List Patient Active Problem List   Diagnosis Date Noted  . 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 Obstructive sleep apnea 04/29/2014    05/12/2014, PTA 06/23/2014, 9:29 AM  [facility] [address] [place], [place], [place] Phone: [phone]   Fax:  [phone]

## 2014-06-23 NOTE — Patient Instructions (Signed)
Knee Extension Mobilization: Towel Prop   With rolled towel under right ankle, place _1-5___ pound weight across knee. Hold __5+__ minutes. Repeat __2-3__ times per set. Do __2__ sets per session. Do __2-4__ sessions per day.  Sitting knee extension stretch    Place one foot on table. Straighten leg and attempt to keep it straight, then push down until feel a stretch. Hold _30__ seconds. Repeat __5-10_ times each leg, alternating. Do _2-4__ sessions per day.      Knee Flexion Stretch on Step  Place foot on step and lean forward until you feel a good stretch in front of knee.   hold 30 sec x 5-10 perform 2-4 x daily   KNEE: Knee Hang - Prone   Lie on stomach. Place towel above knee; hang feet off surface. Keep feet straight. Hold _60__ seconds. _5__ reps per set, __2-4_ sets per day Add _0+__ lb weights to ankles.    PRONE KNEE FLEXION STRETCH WITH BELT - ANKLE ANCHORED  Start by lying on your stomach with a strap or 2 belts linked together and looped it around your affected side ankle.  Next, use the belt to pull the knee into a bent position allowing for a stretch as shown.   stretch 30 sec x5-10 2-3 x daily

## 2014-06-25 ENCOUNTER — Encounter: Payer: Self-pay | Admitting: *Deleted

## 2014-06-25 ENCOUNTER — Ambulatory Visit: Payer: Medicare Other | Admitting: *Deleted

## 2014-06-25 DIAGNOSIS — M25661 Stiffness of right knee, not elsewhere classified: Secondary | ICD-10-CM

## 2014-06-25 DIAGNOSIS — M25561 Pain in right knee: Secondary | ICD-10-CM | POA: Diagnosis not present

## 2014-06-25 NOTE — Therapy (Signed)
[facility] [place], [place], [address] Phone: [phone]   Fax:  [phone]  Physical Therapy Treatment  Patient Details  Name: Garrett Mcknight MRN: [id number] Date of Birth: 12/16/1950 Referring Provider:  [name], MD  Encounter Date: 06/25/2014      PT End of Session - 06/25/14 0824    Visit Number 4   Number of Visits 12   Date for PT Re-Evaluation 08/11/14   PT Start Time 0818   PT Stop Time 0914   PT Time Calculation (min) 56 min      Past Medical History  Diagnosis Date  . Diabetes   . Seasonal allergies   . Sleep apnea     uses CPAP  . Arthritis   . GERD (gastroesophageal reflux disease)   . Hypertension     Past Surgical History  Procedure Laterality Date  . L5 lumbar surgery    . Rhinophyma resection    . Right knee meniscus    . Right wrist reconstruction    . Left knee acl repaired    . Left knee replacement    . Left bicep tendoesis    . Left rotary cuff repair    . Fibroma      removed from scalp 35 years ago  . Total knee arthroplasty Right 06/01/2014    Procedure: RIGHT TOTAL KNEE ARTHROPLASTY;  Surgeon: Latanya Maudlin, MD;  Location: [facility];  Service: Orthopedics;  Laterality: Right;    There were no vitals filed for this visit.  Visit Diagnosis:  Right knee pain  Knee stiffness, right      Subjective Assessment - 06/25/14 0821    Subjective stiffness in right knee today. 4-6/10. Straightening is the hardest   How long can you sit comfortably? 15-20 minutes.   How long can you stand comfortably? 15-20 minutes.   How long can you walk comfortably? 15-20 minutes.   Patient Stated Goals Get out of pain and back to normal life.   Currently in Pain? Yes   Pain Score 6    Pain Location Knee   Pain Orientation Right   Pain Descriptors / Indicators Aching   Pain Type Surgical pain   Pain Frequency Intermittent   Aggravating Factors  increased activity and ROM                          [facility] Adult PT Treatment/Exercise - 06/25/14 0001    Exercises   Exercises Knee/Hip   Knee/Hip Exercises: Aerobic   Stationary Bike Nustep L4 54min, adjsted seat for ROM progression   Knee/Hip Exercises: Standing   Rocker Board 3 minutes  Calf stretching   Other Standing Knee Exercises 14 in box flexion stretching   Cryotherapy   Number Minutes Cryotherapy 15 Minutes   Cryotherapy Location Knee   Type of Cryotherapy Ice pack  Vasopnuematic   Electrical Stimulation   Electrical Stimulation Location Right knee.   Electrical Stimulation Action 1-10hz    Electrical Stimulation Parameters premod x15 min with Vaso   Electrical Stimulation Goals Edema   Manual Therapy   Manual Therapy Passive ROM;Soft tissue mobilization   Passive ROM PROM for flexion/extension in supine RT knee                  PT Short Term Goals - 06/23/14 0909    PT SHORT TERM GOAL #1   Title Ind with initial HEP.   Time 2   Period  Weeks   Status Achieved           PT Long Term Goals - 06/23/14 6503    PT LONG TERM GOAL #1   Title Ind with an advanced HEP.   Time 4   Period Weeks   Status On-going   PT LONG TERM GOAL #2   Title Achieve full right knee extension to normalize gait.   Time 4   Period Weeks   Status On-going   PT LONG TERM GOAL #3   Title Achieve active right knee flexion to 115 degrees+ to increase function.   Time 4   Period Weeks   Status On-going   PT LONG TERM GOAL #4   Title Perform a reciprocating stair gait with one railing.   Time 4   Period Weeks   Status On-going   PT LONG TERM GOAL #5   Title Walk a community distance without assistive device and pain not > 3/10.   Time 4   Period Weeks   Status On-going               Plan - 06/25/14 0825    Clinical Impression Statement Pt did fair with Rx today, but continues to have ROM deficits in flexion and extension. His PROM was 5-95 degrees today and unable to  meet goals today. He was very sensitive Lateral aspect  of RT knee     Pt will benefit from skilled therapeutic intervention in order to improve on the following deficits Abnormal gait;Decreased activity tolerance;Decreased strength;Increased edema;Pain   Rehab Potential Good   PT Duration 4 weeks   PT Treatment/Interventions ADLs/Self Care Home Management;Cryotherapy;Occupational psychologist;Therapeutic activities;Therapeutic exercise;Neuromuscular re-education;Patient/family education;Manual techniques;Passive range of motion;Vasopneumatic Device   PT Next Visit Plan Nustep; work hard on getting full right knee extension.  TKR protocol.        Problem List Patient Active Problem List   Diagnosis Date Noted  . 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 Obstructive sleep apnea 04/29/2014    Melysa Schroyer,CHRIS, PTA 06/25/2014, 9:49 AM  [facility] [address] [place], [place], [address] Phone: [phone]   Fax:  [phone]

## 2014-06-28 ENCOUNTER — Encounter: Payer: Medicare Other | Admitting: Physical Therapy

## 2014-07-01 ENCOUNTER — Ambulatory Visit: Payer: Medicare Other | Admitting: Physical Therapy

## 2014-07-01 ENCOUNTER — Encounter: Payer: Self-pay | Admitting: Physical Therapy

## 2014-07-01 DIAGNOSIS — M25561 Pain in right knee: Secondary | ICD-10-CM | POA: Diagnosis not present

## 2014-07-01 DIAGNOSIS — M25661 Stiffness of right knee, not elsewhere classified: Secondary | ICD-10-CM

## 2014-07-01 NOTE — Therapy (Signed)
[facility] [place], [place], [address] Phone: [phone]   Fax:  [phone]  Physical Therapy Treatment  Patient Details  Name: Garrett Mcknight MRN: [id number] Date of Birth: 10/18/1950 Referring Provider:  [name], MD  Encounter Date: 07/01/2014      PT End of Session - 07/01/14 0825    Visit Number 5   Number of Visits 12   Date for PT Re-Evaluation 08/11/14   PT Start Time 0814   PT Stop Time 0913   PT Time Calculation (min) 59 min   Activity Tolerance Patient tolerated treatment well   Behavior During Therapy [facility] for tasks assessed/performed      Past Medical History  Diagnosis Date  . Diabetes   . Seasonal allergies   . Sleep apnea     uses CPAP  . Arthritis   . GERD (gastroesophageal reflux disease)   . Hypertension     Past Surgical History  Procedure Laterality Date  . L5 lumbar surgery    . Rhinophyma resection    . Right knee meniscus    . Right wrist reconstruction    . Left knee acl repaired    . Left knee replacement    . Left bicep tendoesis    . Left rotary cuff repair    . Fibroma      removed from scalp 35 years ago  . Total knee arthroplasty Right 06/01/2014    Procedure: RIGHT TOTAL KNEE ARTHROPLASTY;  Surgeon: Latanya Maudlin, MD;  Location: [facility];  Service: Orthopedics;  Laterality: Right;    There were no vitals filed for this visit.  Visit Diagnosis:  Right knee pain  Knee stiffness, right      Subjective Assessment - 07/01/14 0812    Subjective stiffness in right knee today 4-6/10 pain   Limitations Sitting;Standing;Walking   How long can you sit comfortably? 15-20 minutes.   How long can you stand comfortably? 15-20 minutes.   How long can you walk comfortably? 15-20 minutes.   Patient Stated Goals Get out of pain and back to normal life.   Currently in Pain? Yes   Pain Score 6    Pain Location Knee   Pain Orientation Right   Pain Descriptors / Indicators  Aching   Pain Type Surgical pain   Pain Frequency Intermittent   Aggravating Factors  ROM in knee and increased activity   Pain Relieving Factors rest            [facility] PT Assessment - 07/01/14 0001    AROM   Overall AROM  Deficits   AROM Assessment Site Knee   Right/Left Knee Right   Right Knee Extension -18   Right Knee Flexion 85   PROM   Overall PROM  Deficits   Overall PROM Comments -15-88   PROM Assessment Site Knee   Right/Left Knee Right                     [facility] Adult PT Treatment/Exercise - 07/01/14 0001    Knee/Hip Exercises: Stretches   Knee: Self-Stretch to increase Flexion 3 reps;30 seconds   Knee/Hip Exercises: Aerobic   Stationary Bike Nustep L4 67min, adjsted seat for ROM progression   Knee/Hip Exercises: Standing   Rocker Board 3 minutes   Cryotherapy   Number Minutes Cryotherapy 15 Minutes   Cryotherapy Location Knee   Type of Cryotherapy --  vasopnumatic   Electrical Stimulation   Electrical Stimulation Location Right knee.  Electrical Stimulation Action 1-10HZ    Electrical Stimulation Parameters premod   Research scientist (physical sciences) Goals Edema;Pain   Manual Therapy   Manual Therapy Passive ROM;Soft tissue mobilization   Passive ROM PROM for flexion/extension with low load holds in supine RT knee                  PT Short Term Goals - 06/23/14 0909    PT SHORT TERM GOAL #1   Title Ind with initial HEP.   Time 2   Period Weeks   Status Achieved           PT Long Term Goals - 06/23/14 0909    PT LONG TERM GOAL #1   Title Ind with an advanced HEP.   Time 4   Period Weeks   Status On-going   PT LONG TERM GOAL #2   Title Achieve full right knee extension to normalize gait.   Time 4   Period Weeks   Status On-going   PT LONG TERM GOAL #3   Title Achieve active right knee flexion to 115 degrees+ to increase function.   Time 4   Period Weeks   Status On-going   PT LONG TERM GOAL #4   Title Perform a  reciprocating stair gait with one railing.   Time 4   Period Weeks   Status On-going   PT LONG TERM GOAL #5   Title Walk a community distance without assistive device and pain not > 3/10.   Time 4   Period Weeks   Status On-going               Plan - 07/01/14 07/14/14    Clinical Impression Statement Pateint tolerated treatment well today and is slowly progresing with ROM in knee due to limitations. Patient's knee is very stiff and sore with ROM esp with flexiuon. Unable to meet any goals due to pain and ROM limitaions.   Pt will benefit from skilled therapeutic intervention in order to improve on the following deficits Abnormal gait;Decreased activity tolerance;Decreased strength;Increased edema;Pain   Rehab Potential Good   PT Frequency 3x / week   PT Duration 4 weeks   PT Treatment/Interventions ADLs/Self Care Home Management;Cryotherapy;4825;Therapeutic activities;Therapeutic exercise;Neuromuscular re-education;Patient/family education;Manual techniques;Passive range of motion;Vasopneumatic Device   PT Next Visit Plan Nustep; work hard on getting full right knee extension.  TKR protocol.        Problem List Patient Active Problem List   Diagnosis Date Noted  . 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 Obstructive sleep apnea 04/29/2014   05/12/2014, PTA 07/01/2014 9:16 AM  [place], 07/14/2014, PTA 07/01/2014, 9:16 AM  [facility] [address] [place], [place], [place] Phone: [phone]   Fax:  [phone]

## 2014-07-02 ENCOUNTER — Encounter: Payer: Self-pay | Admitting: Physical Therapy

## 2014-07-02 ENCOUNTER — Ambulatory Visit: Payer: Medicare Other | Admitting: Internal Medicine

## 2014-07-02 ENCOUNTER — Ambulatory Visit: Payer: Medicare Other | Admitting: Physical Therapy

## 2014-07-02 DIAGNOSIS — M25561 Pain in right knee: Secondary | ICD-10-CM | POA: Diagnosis not present

## 2014-07-02 DIAGNOSIS — M25661 Stiffness of right knee, not elsewhere classified: Secondary | ICD-10-CM

## 2014-07-02 DIAGNOSIS — Z96651 Presence of right artificial knee joint: Secondary | ICD-10-CM | POA: Diagnosis not present

## 2014-07-02 DIAGNOSIS — Z471 Aftercare following joint replacement surgery: Secondary | ICD-10-CM | POA: Diagnosis not present

## 2014-07-02 NOTE — Therapy (Signed)
[facility] [place], [place], [address] Phone: [phone]   Fax:  [phone]  Physical Therapy Treatment  Patient Details  Name: Garrett Mcknight MRN: [id number] Date of Birth: 1950-10-26 Referring Provider:  [name], MD  Encounter Date: 07/02/2014      PT End of Session - 07/02/14 1053    Visit Number 6   Number of Visits 12   Date for PT Re-Evaluation 08/11/14   PT Start Time 1031   PT Stop Time 1127   PT Time Calculation (min) 56 min   Activity Tolerance Patient tolerated treatment well   Behavior During Therapy [facility] for tasks assessed/performed      Past Medical History  Diagnosis Date  . Diabetes   . Seasonal allergies   . Sleep apnea     uses CPAP  . Arthritis   . GERD (gastroesophageal reflux disease)   . Hypertension     Past Surgical History  Procedure Laterality Date  . L5 lumbar surgery    . Rhinophyma resection    . Right knee meniscus    . Right wrist reconstruction    . Left knee acl repaired    . Left knee replacement    . Left bicep tendoesis    . Left rotary cuff repair    . Fibroma      removed from scalp 35 years ago  . Total knee arthroplasty Right 06/01/2014    Procedure: RIGHT TOTAL KNEE ARTHROPLASTY;  Surgeon: Latanya Maudlin, MD;  Location: [facility];  Service: Orthopedics;  Laterality: Right;    There were no vitals filed for this visit.  Visit Diagnosis:  Right knee pain  Knee stiffness, right      Subjective Assessment - 07/02/14 1039    Subjective States that Dr. Gladstone Lighter worried more about the extension of the R knee than the flexion. Is to return to [place] next Tuesday for their PT department to assess what they will do in therapy. Stated that if ROM not better in 2 weeks, Dr.Giofffre said he will schedule manipulation. Reports that he sat on a bench yesterday at home and was forcing the R knee as far back as the LLE would take it and has been walking up to  1/4th of a mile several times a day.   Limitations Sitting;Standing;Walking   How long can you sit comfortably? 15-20 minutes.   How long can you stand comfortably? 15-20 minutes.   How long can you walk comfortably? 15-20 minutes.   Patient Stated Goals Get out of pain and back to normal life.   Currently in Pain? Yes   Pain Score 3    Pain Location Knee   Pain Orientation Right   Pain Descriptors / Indicators Sore   Pain Type Surgical pain            [facility] PT Assessment - 07/02/14 0001    Assessment   Medical Diagnosis Right total knee arthroplasty.   Onset Date/Surgical Date 06/01/14   Next MD Visit 07/16/2014   ROM / Strength   AROM / PROM / Strength PROM   PROM   Overall PROM  Deficits   Overall PROM Comments 9-91   PROM Assessment Site Knee   Right/Left Knee Right                     [facility] Adult PT Treatment/Exercise - 07/02/14 0001    Exercises   Exercises Knee/Hip   Knee/Hip Exercises: Stretches  Active Hamstring Stretch Right;3 reps;30 seconds   Knee: Self-Stretch to increase Flexion 3 reps;30 seconds   Knee/Hip Exercises: Aerobic   Nustep L5 x14 min with seat progression to increase ROM   Modalities   Modalities Cryotherapy;Electrical Stimulation   Cryotherapy   Number Minutes Cryotherapy 15 Minutes   Cryotherapy Location Knee   Type of Cryotherapy Other (comment)  Vasopnuematic   Electrical Stimulation   Electrical Stimulation Location Right knee.   Electrical Stimulation Action 1-10 Hz x15 min   Electrical Stimulation Parameters IFC   Electrical Stimulation Goals Edema;Pain   Manual Therapy   Manual Therapy Myofascial release;Passive ROM   Myofascial Release IASTW around the R patella to decrease pain and tightness   Passive ROM PROM for flexion/extension with low load holds in supine RT knee                  PT Short Term Goals - 06/23/14 0909    PT SHORT TERM GOAL #1   Title Ind with initial HEP.   Time 2   Period  Weeks   Status Achieved           PT Long Term Goals - 06/23/14 0909    PT LONG TERM GOAL #1   Title Ind with an advanced HEP.   Time 4   Period Weeks   Status On-going   PT LONG TERM GOAL #2   Title Achieve full right knee extension to normalize gait.   Time 4   Period Weeks   Status On-going   PT LONG TERM GOAL #3   Title Achieve active right knee flexion to 115 degrees+ to increase function.   Time 4   Period Weeks   Status On-going   PT LONG TERM GOAL #4   Title Perform a reciprocating stair gait with one railing.   Time 4   Period Weeks   Status On-going   PT LONG TERM GOAL #5   Title Walk a community distance without assistive device and pain not > 3/10.   Time 4   Period Weeks   Status On-going               Plan - 07/02/14 1237    Clinical Impression Statement Patient is progressing slowly with activity and PROM although limited in ROM due to increased pain. IASTW conducted around the R patella in efforts to break adhesions, decrease tightness and pain. PROM was measured as 9-91 degrees today in clinic. Normal integumentary respones noted following removal of the modalities, but patient had increased pain around the R patella during e-stim and cryotherapy today. Experienced 3/10 pain following treatment.   Pt will benefit from skilled therapeutic intervention in order to improve on the following deficits Abnormal gait;Decreased activity tolerance;Decreased strength;Increased edema;Pain   Rehab Potential Good   PT Frequency 3x / week   PT Duration 4 weeks   PT Treatment/Interventions ADLs/Self Care Home Management;Cryotherapy;Occupational psychologist;Therapeutic activities;Therapeutic exercise;Neuromuscular re-education;Patient/family education;Manual techniques;Passive range of motion;Vasopneumatic Device   PT Next Visit Plan Continue per MPT POC. Continue to enforce ROM.   Consulted and Agree with Plan of Care Patient         Problem List Patient Active Problem List   Diagnosis Date Noted  . 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 Obstructive sleep apnea 04/29/2014    Wynelle Fanny, PTA 07/02/2014, 12:43 PM  [facility] [address] [place], [place], [address] Phone: [phone]   Fax:  [phone]      

## 2014-07-05 ENCOUNTER — Encounter: Payer: Self-pay | Admitting: Physical Therapy

## 2014-07-05 ENCOUNTER — Ambulatory Visit: Payer: Medicare Other | Admitting: Physical Therapy

## 2014-07-05 DIAGNOSIS — M25561 Pain in right knee: Secondary | ICD-10-CM

## 2014-07-05 DIAGNOSIS — M25661 Stiffness of right knee, not elsewhere classified: Secondary | ICD-10-CM

## 2014-07-05 NOTE — Therapy (Signed)
[facility] [place], [place], [address] Phone: [phone]   Fax:  [phone]  Physical Therapy Treatment  Patient Details  Name: Garrett Mcknight MRN: [id number] Date of Birth: Dec 26, 1950 Referring Provider:  [name], MD  Encounter Date: 07/05/2014      PT End of Session - 07/05/14 0900    Visit Number 7   Number of Visits 12   Date for PT Re-Evaluation 08/11/14   PT Start Time 0815   PT Stop Time 0915   PT Time Calculation (min) 60 min   Activity Tolerance Patient tolerated treatment well   Behavior During Therapy [facility] for tasks assessed/performed      Past Medical History  Diagnosis Date  . Diabetes   . Seasonal allergies   . Sleep apnea     uses CPAP  . Arthritis   . GERD (gastroesophageal reflux disease)   . Hypertension     Past Surgical History  Procedure Laterality Date  . L5 lumbar surgery    . Rhinophyma resection    . Right knee meniscus    . Right wrist reconstruction    . Left knee acl repaired    . Left knee replacement    . Left bicep tendoesis    . Left rotary cuff repair    . Fibroma      removed from scalp 35 years ago  . Total knee arthroplasty Right 06/01/2014    Procedure: RIGHT TOTAL KNEE ARTHROPLASTY;  Surgeon: Latanya Maudlin, MD;  Location: [facility];  Service: Orthopedics;  Laterality: Right;    There were no vitals filed for this visit.  Visit Diagnosis:  Right knee pain  Knee stiffness, right      Subjective Assessment - 07/05/14 9381    Subjective Patient a little sore after last treatment , reports less of stiffness in right knee today.   Limitations Sitting;Standing;Walking   How long can you sit comfortably? 15-20 minutes.   How long can you stand comfortably? 15-20 minutes.   How long can you walk comfortably? 15-20 minutes.   Patient Stated Goals Get out of pain and back to normal life.   Currently in Pain? Yes   Pain Score 4    Pain Location Knee   Pain  Orientation Right   Pain Descriptors / Indicators Sore   Pain Type Surgical pain   Pain Frequency Intermittent   Aggravating Factors  ROM and increased activity   Pain Relieving Factors rest            [facility] PT Assessment - 07/05/14 0001    AROM   Overall AROM  Deficits   AROM Assessment Site Knee   Right/Left Knee Right   Right Knee Extension -15   Right Knee Flexion 91   PROM   Overall PROM  Deficits   Overall PROM Comments -9-100   PROM Assessment Site Knee   Right/Left Knee Right                     [facility] Adult PT Treatment/Exercise - 07/05/14 0001    Knee/Hip Exercises: Aerobic   Nustep L5 x15 min with seat progression(to seat 9 )to increase ROM   Cryotherapy   Number Minutes Cryotherapy 15 Minutes   Cryotherapy Location Knee   Type of Cryotherapy --  vasopnumatic   Electrical Stimulation   Electrical Stimulation Location Right knee.   Electrical Stimulation Action 1-10hz    Electrical Stimulation Parameters IFC   Electrical Stimulation Goals  Edema;Pain   Manual Therapy   Manual Therapy Passive ROM;Myofascial release   Myofascial Release IASTW around the R patella to decrease pain and tightness   Passive ROM PROM for flexion/extension with low load holds in supine RT knee                  PT Short Term Goals - 06/23/14 0909    PT SHORT TERM GOAL #1   Title Ind with initial HEP.   Time 2   Period Weeks   Status Achieved           PT Long Term Goals - 06/23/14 0909    PT LONG TERM GOAL #1   Title Ind with an advanced HEP.   Time 4   Period Weeks   Status On-going   PT LONG TERM GOAL #2   Title Achieve full right knee extension to normalize gait.   Time 4   Period Weeks   Status On-going   PT LONG TERM GOAL #3   Title Achieve active right knee flexion to 115 degrees+ to increase function.   Time 4   Period Weeks   Status On-going   PT LONG TERM GOAL #4   Title Perform a reciprocating stair gait with one railing.    Time 4   Period Weeks   Status On-going   PT LONG TERM GOAL #5   Title Walk a community distance without assistive device and pain not > 3/10.   Time 4   Period Weeks   Status On-going               Plan - 07/05/14 0900    Clinical Impression Statement Patient has continued to respond well to treatments and has imporoved to both flexion and extension today. Patient reported feeling improvement with ambulation this past weekend.Goals ongoing due to limitations from ROM, edema and strength deficits.    Pt will benefit from skilled therapeutic intervention in order to improve on the following deficits Abnormal gait;Decreased activity tolerance;Decreased strength;Increased edema;Pain   Rehab Potential Good   PT Frequency 3x / week   PT Duration 4 weeks   PT Treatment/Interventions ADLs/Self Care Home Management;Cryotherapy;Occupational psychologist;Therapeutic activities;Therapeutic exercise;Neuromuscular re-education;Patient/family education;Manual techniques;Passive range of motion;Vasopneumatic Device   PT Next Visit Plan Continue per MPT POC. Continue to enforce ROM.   Consulted and Agree with Plan of Care Patient        Problem List Patient Active Problem List   Diagnosis Date Noted  . 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 Obstructive sleep apnea 04/29/2014    Phillips Climes, PTA 07/05/2014, 9:46 AM  [facility] [address] [place], [place], [address] Phone: [phone]   Fax:  [phone]

## 2014-07-06 DIAGNOSIS — M1711 Unilateral primary osteoarthritis, right knee: Secondary | ICD-10-CM | POA: Diagnosis not present

## 2014-07-07 ENCOUNTER — Encounter: Payer: Medicare Other | Admitting: Physical Therapy

## 2014-07-09 ENCOUNTER — Encounter: Payer: Medicare Other | Admitting: Physical Therapy

## 2014-07-09 DIAGNOSIS — M1711 Unilateral primary osteoarthritis, right knee: Secondary | ICD-10-CM | POA: Diagnosis not present

## 2014-07-13 DIAGNOSIS — M1711 Unilateral primary osteoarthritis, right knee: Secondary | ICD-10-CM | POA: Diagnosis not present

## 2014-07-14 DIAGNOSIS — M1711 Unilateral primary osteoarthritis, right knee: Secondary | ICD-10-CM | POA: Diagnosis not present

## 2014-07-16 ENCOUNTER — Ambulatory Visit (HOSPITAL_BASED_OUTPATIENT_CLINIC_OR_DEPARTMENT_OTHER): Payer: Medicare Other | Attending: Internal Medicine

## 2014-07-16 DIAGNOSIS — M1711 Unilateral primary osteoarthritis, right knee: Secondary | ICD-10-CM | POA: Diagnosis not present

## 2014-07-16 DIAGNOSIS — Z96651 Presence of right artificial knee joint: Secondary | ICD-10-CM | POA: Diagnosis not present

## 2014-07-16 DIAGNOSIS — Z471 Aftercare following joint replacement surgery: Secondary | ICD-10-CM | POA: Diagnosis not present

## 2014-07-19 ENCOUNTER — Other Ambulatory Visit: Payer: Self-pay | Admitting: Family Medicine

## 2014-07-20 DIAGNOSIS — M1711 Unilateral primary osteoarthritis, right knee: Secondary | ICD-10-CM | POA: Diagnosis not present

## 2014-07-20 NOTE — Telephone Encounter (Signed)
Last seen 02/24/14  Dr Livia Snellen

## 2014-07-22 DIAGNOSIS — M1711 Unilateral primary osteoarthritis, right knee: Secondary | ICD-10-CM | POA: Diagnosis not present

## 2014-07-23 DIAGNOSIS — M1711 Unilateral primary osteoarthritis, right knee: Secondary | ICD-10-CM | POA: Diagnosis not present

## 2014-07-27 DIAGNOSIS — M1711 Unilateral primary osteoarthritis, right knee: Secondary | ICD-10-CM | POA: Diagnosis not present

## 2014-07-29 DIAGNOSIS — M1711 Unilateral primary osteoarthritis, right knee: Secondary | ICD-10-CM | POA: Diagnosis not present

## 2014-08-02 DIAGNOSIS — Z471 Aftercare following joint replacement surgery: Secondary | ICD-10-CM | POA: Diagnosis not present

## 2014-08-02 DIAGNOSIS — Z96651 Presence of right artificial knee joint: Secondary | ICD-10-CM | POA: Diagnosis not present

## 2014-08-03 DIAGNOSIS — M1711 Unilateral primary osteoarthritis, right knee: Secondary | ICD-10-CM | POA: Diagnosis not present

## 2014-08-05 DIAGNOSIS — M1711 Unilateral primary osteoarthritis, right knee: Secondary | ICD-10-CM | POA: Diagnosis not present

## 2014-08-09 DIAGNOSIS — M1711 Unilateral primary osteoarthritis, right knee: Secondary | ICD-10-CM | POA: Diagnosis not present

## 2014-08-12 DIAGNOSIS — M1711 Unilateral primary osteoarthritis, right knee: Secondary | ICD-10-CM | POA: Diagnosis not present

## 2014-08-15 ENCOUNTER — Other Ambulatory Visit: Payer: Self-pay | Admitting: Family Medicine

## 2014-08-16 DIAGNOSIS — M1711 Unilateral primary osteoarthritis, right knee: Secondary | ICD-10-CM | POA: Diagnosis not present

## 2014-08-19 DIAGNOSIS — M1711 Unilateral primary osteoarthritis, right knee: Secondary | ICD-10-CM | POA: Diagnosis not present

## 2014-08-23 DIAGNOSIS — M1711 Unilateral primary osteoarthritis, right knee: Secondary | ICD-10-CM | POA: Diagnosis not present

## 2014-08-31 DIAGNOSIS — M1711 Unilateral primary osteoarthritis, right knee: Secondary | ICD-10-CM | POA: Diagnosis not present

## 2014-09-16 DIAGNOSIS — M545 Low back pain: Secondary | ICD-10-CM | POA: Diagnosis not present

## 2014-09-16 DIAGNOSIS — Z471 Aftercare following joint replacement surgery: Secondary | ICD-10-CM | POA: Diagnosis not present

## 2014-09-16 DIAGNOSIS — Z96651 Presence of right artificial knee joint: Secondary | ICD-10-CM | POA: Diagnosis not present

## 2014-09-23 DIAGNOSIS — M545 Low back pain: Secondary | ICD-10-CM | POA: Diagnosis not present

## 2014-09-27 ENCOUNTER — Other Ambulatory Visit: Payer: Self-pay

## 2014-09-27 MED ORDER — METFORMIN HCL 1000 MG PO TABS: ORAL_TABLET | ORAL | Status: DC

## 2014-09-27 NOTE — Telephone Encounter (Signed)
Last seen 02/24/14  Dr Stacks 

## 2014-09-28 DIAGNOSIS — M5136 Other intervertebral disc degeneration, lumbar region: Secondary | ICD-10-CM | POA: Diagnosis not present

## 2014-10-18 ENCOUNTER — Other Ambulatory Visit: Payer: Self-pay | Admitting: Family Medicine

## 2014-10-19 NOTE — Telephone Encounter (Signed)
Last seen 02/24/14  Dr Stacks 

## 2014-12-10 ENCOUNTER — Other Ambulatory Visit: Payer: Self-pay | Admitting: Family Medicine

## 2014-12-13 NOTE — Telephone Encounter (Signed)
Last seen 02/24/14  Dr Livia Snellen  Requesting 90 day supply

## 2015-01-18 ENCOUNTER — Other Ambulatory Visit: Payer: Self-pay

## 2015-01-18 NOTE — Telephone Encounter (Signed)
Last seen 2/16  Dr  Livia Snellen

## 2015-01-18 NOTE — Telephone Encounter (Signed)
Last seen 02/24/14  Dr Livia Snellen  This med not on EPIC list

## 2015-01-25 ENCOUNTER — Other Ambulatory Visit: Payer: Self-pay

## 2015-01-25 MED ORDER — METFORMIN HCL 1000 MG PO TABS: ORAL_TABLET | ORAL | Status: DC

## 2015-01-25 NOTE — Telephone Encounter (Signed)
Last seen 02/24/14  Dr Livia Snellen   Also requesting Gabapentin but not on EPIC list

## 2015-01-25 NOTE — Telephone Encounter (Signed)
Pt notified of refill appt scheduled

## 2015-02-02 ENCOUNTER — Ambulatory Visit (INDEPENDENT_AMBULATORY_CARE_PROVIDER_SITE_OTHER): Payer: Medicare Other | Admitting: Family Medicine

## 2015-02-02 ENCOUNTER — Encounter: Payer: Self-pay | Admitting: Family Medicine

## 2015-02-02 VITALS — BP 111/70 | HR 78 | Temp 97.1°F | Ht 72.0 in | Wt 225.0 lb

## 2015-02-02 DIAGNOSIS — F5221 Male erectile disorder: Secondary | ICD-10-CM | POA: Diagnosis not present

## 2015-02-02 DIAGNOSIS — Z23 Encounter for immunization: Secondary | ICD-10-CM

## 2015-02-02 DIAGNOSIS — E118 Type 2 diabetes mellitus with unspecified complications: Secondary | ICD-10-CM | POA: Diagnosis not present

## 2015-02-02 LAB — POCT GLYCOSYLATED HEMOGLOBIN (HGB A1C): Hemoglobin A1C: 6.8

## 2015-02-02 MED ORDER — CYCLOSPORINE 0.05 % OP EMUL: 1.0000 [drp] | Freq: Two times a day (BID) | OPHTHALMIC | Status: DC

## 2015-02-02 MED ORDER — PREGABALIN 50 MG PO CAPS: 50.0000 mg | ORAL_CAPSULE | Freq: Three times a day (TID) | ORAL | Status: DC

## 2015-02-02 MED ORDER — LISINOPRIL 10 MG PO TABS: 10.0000 mg | ORAL_TABLET | Freq: Every day | ORAL | Status: DC

## 2015-02-02 MED ORDER — METFORMIN HCL 1000 MG PO TABS: ORAL_TABLET | ORAL | Status: DC

## 2015-02-02 MED ORDER — SILDENAFIL CITRATE 20 MG PO TABS: 20.0000 mg | ORAL_TABLET | Freq: Every day | ORAL | Status: DC

## 2015-02-02 NOTE — Patient Instructions (Signed)
Use refresh eye drops

## 2015-02-02 NOTE — Progress Notes (Signed)
Subjective:  Patient ID: Garrett Mcknight, male    DOB: May 17, 1950  Age: 65 y.o. MRN: [id number]  CC: Diabetes and Erectile Dysfunction   HPI Tamer Baughman presents for burning in feet at night. Sig other/girlfriend with stage 4 pancreas ca. Has caused him to not focus on his own health care recently  Follow-up of diabetes. Patient does not check blood sugar at home Patient denies symptoms such as polyuria, polydipsia, excessive hunger, nausea No significant hypoglycemic spells noted. Medications as noted below. Taking them regularly without complication/adverse reaction being reported today.   Doing well with erectile dysfunction issues. Would like to switch to generic sildenafil if possible. Additionally he needs to resume his medication for his diabetic neuropathy with burning of the feet at night.  History Pravin has a past medical history of Diabetes ([place]); Seasonal allergies; Sleep apnea; Arthritis; GERD (gastroesophageal reflux disease); and Hypertension.   He has past surgical history that includes L5 lumbar surgery; Rhinophyma resection; Right knee meniscus; Right wrist reconstruction; Left knee ACL repaired; Left knee replacement; Left bicep tendoesis; Left rotary cuff repair; fibroma; and Total knee arthroplasty (Right, 06/01/2014).   His family history includes Breast cancer in his sister; Diabetes in his maternal grandmother; Diabetes Mellitus II in his father; Stroke in his maternal grandfather.He reports that he has never smoked. He does not have any smokeless tobacco history on file. He reports that he drinks alcohol. He reports that he does not use illicit drugs.    ROS Review of Systems  Constitutional: Negative for fever, chills, diaphoresis and unexpected weight change.  HENT: Negative for congestion, hearing loss, rhinorrhea and sore throat.   Eyes: Negative for visual disturbance.  Respiratory: Negative for cough and shortness of breath.   Cardiovascular: Negative  for chest pain.  Gastrointestinal: Negative for abdominal pain, diarrhea and constipation.  Genitourinary: Negative for dysuria and flank pain.  Musculoskeletal: Negative for joint swelling and arthralgias.  Skin: Negative for rash.  Neurological: Negative for dizziness and headaches.  Psychiatric/Behavioral: Negative for sleep disturbance and dysphoric mood.    Objective:  BP 111/70 mmHg  Pulse 78  Temp(Src) 97.1 F (36.2 C) (Oral)  Ht 6' (1.829 m)  Wt 225 lb (102.059 kg)  BMI 30.51 kg/m2  SpO2 97%  BP Readings from Last 3 Encounters:  02/02/15 111/70  06/03/14 135/73  05/26/14 140/75    Wt Readings from Last 3 Encounters:  02/02/15 225 lb (102.059 kg)  06/01/14 218 lb (98.884 kg)  05/26/14 218 lb 9.6 oz (99.156 kg)     Physical Exam  Constitutional: He is oriented to person, place, and time. He appears well-developed and well-nourished. No distress.  HENT:  Head: Normocephalic and atraumatic.  Right Ear: External ear normal.  Left Ear: External ear normal.  Nose: Nose normal.  Mouth/Throat: Oropharynx is clear and moist.  Eyes: Conjunctivae and EOM are normal. Pupils are equal, round, and reactive to light.  Neck: Normal range of motion. Neck supple. No thyromegaly present.  Cardiovascular: Normal rate, regular rhythm and normal heart sounds.   No murmur heard. Pulmonary/Chest: Effort normal and breath sounds normal. No respiratory distress. He has no wheezes. He has no rales.  Abdominal: Soft. Bowel sounds are normal. He exhibits no distension. There is no tenderness.  Lymphadenopathy:    He has no cervical adenopathy.  Neurological: He is alert and oriented to person, place, and time. He has normal reflexes.  Skin: Skin is warm and dry.  Psychiatric: He has a normal mood and  affect. His behavior is normal. Judgment and thought content normal.     Lab Results  Component Value Date   WBC 6.9 02/02/2015   HGB 13.0 06/03/2014   HCT 43.1 02/02/2015   PLT 225  02/02/2015   GLUCOSE 151* 02/02/2015   CHOL 148 02/02/2015   TRIG 63 02/02/2015   HDL 49 02/02/2015   LDLCALC 86 02/02/2015   ALT 23 02/02/2015   AST 25 02/02/2015   NA 139 02/02/2015   K 4.3 02/02/2015   CL 101 02/02/2015   CREATININE 0.84 02/02/2015   BUN 18 02/02/2015   CO2 20 02/02/2015   INR 1.14 05/26/2014   HGBA1C 6.8 02/02/2015    Dg Chest 2 View  05/26/2014  CLINICAL DATA:  Preoperative exam prior to total knee joint replacement ; history of sleep apnea and gastroesophageal reflux EXAM: CHEST  2 VIEW COMPARISON:  None FINDINGS: The lungs are adequately inflated and clear. The heart and pulmonary vascularity are normal. The mediastinum is normal in width. There is no pleural effusion. There is calcification of the anterior longitudinal ligament of the thoracic spine. The thoracic vertebral bodies are preserved in height. There is an old fracture of the midshaft of the right clavicle. IMPRESSION: There is no active cardiopulmonary disease. Electronically Signed   By: Ashlee  Martinique M.D.   On: 05/26/2014 17:06    Assessment & Plan:   Carmen was seen today for diabetes and erectile dysfunction.  Diagnoses and all orders for this visit:  Controlled type 2 diabetes mellitus with complication, without long-term current use of insulin (HCC) -     POCT glycosylated hemoglobin (Hb A1C) -     Microalbumin / creatinine urine ratio -     CBC with Differential/Platelet -     CMP14+EGFR -     Lipid panel  Encounter for immunization  Other orders -     pregabalin (LYRICA) 50 MG capsule; Take 1 capsule (50 mg total) by mouth 3 (three) times daily. -     cycloSPORINE (RESTASIS) 0.05 % ophthalmic emulsion; Place 1 drop into both eyes 2 (two) times daily. -     sildenafil (REVATIO) 20 MG tablet; Take 1 tablet (20 mg total) by mouth daily. -     lisinopril (PRINIVIL,ZESTRIL) 10 MG tablet; Take 1 tablet (10 mg total) by mouth daily. -     metFORMIN (GLUCOPHAGE) 1000 MG tablet; TAKE 1  TABLET (1,000 MG TOTAL) BY MOUTH 2 (TWO) TIMES DAILY WITH A MEAL. -     Pneumococcal conjugate vaccine 13-valent -     Flu Vaccine QUAD 36+ mos IM     I have discontinued Mr. Knee pregabalin, Tetrahydrozoline HCl (VISINE OP), sildenafil, oxyCODONE-acetaminophen, rivaroxaban, and methocarbamol. I have also changed his lisinopril. Additionally, I am having him start on pregabalin, cycloSPORINE, and sildenafil. Lastly, I am having him maintain his fluticasone, fexofenadine, and metFORMIN.  Meds ordered this encounter  Medications  . pregabalin (LYRICA) 50 MG capsule    Sig: Take 1 capsule (50 mg total) by mouth 3 (three) times daily.    Dispense:  90 capsule    Refill:  1  . cycloSPORINE (RESTASIS) 0.05 % ophthalmic emulsion    Sig: Place 1 drop into both eyes 2 (two) times daily.    Dispense:  0.4 mL    Refill:  11  . sildenafil (REVATIO) 20 MG tablet    Sig: Take 1 tablet (20 mg total) by mouth daily.    Dispense:  30 tablet  Refill:  11  . lisinopril (PRINIVIL,ZESTRIL) 10 MG tablet    Sig: Take 1 tablet (10 mg total) by mouth daily.    Dispense:  90 tablet    Refill:  4  . metFORMIN (GLUCOPHAGE) 1000 MG tablet    Sig: TAKE 1 TABLET (1,000 MG TOTAL) BY MOUTH 2 (TWO) TIMES DAILY WITH A MEAL.    Dispense:  180 tablet    Refill:  0    CYCLE FILL MEDICATION. Authorization is required for next refill.     Follow-up: Return in about 3 months (around 05/03/2015).  [name], M.D.

## 2015-02-03 LAB — MICROALBUMIN / CREATININE URINE RATIO
Creatinine, Urine: 256.3 mg/dL
MICROALB/CREAT RATIO: 10.1 mg/g creat (ref 0.0–30.0)
MICROALBUM., U, RANDOM: 25.8 ug/mL

## 2015-02-03 LAB — CBC WITH DIFFERENTIAL/PLATELET
BASOS: 1 %
Basophils Absolute: 0.1 10*3/uL (ref 0.0–0.2)
EOS (ABSOLUTE): 0.4 10*3/uL (ref 0.0–0.4)
Eos: 6 %
HEMATOCRIT: 43.1 % (ref 37.5–51.0)
HEMOGLOBIN: 14.6 g/dL (ref 12.6–17.7)
IMMATURE GRANS (ABS): 0 10*3/uL (ref 0.0–0.1)
Immature Granulocytes: 0 %
LYMPHS: 29 %
Lymphocytes Absolute: 2 10*3/uL (ref 0.7–3.1)
MCH: 27.1 pg (ref 26.6–33.0)
MCHC: 33.9 g/dL (ref 31.5–35.7)
MCV: 80 fL (ref 79–97)
MONOCYTES: 9 %
Monocytes Absolute: 0.6 10*3/uL (ref 0.1–0.9)
NEUTROS ABS: 3.8 10*3/uL (ref 1.4–7.0)
Neutrophils: 55 %
Platelets: 225 10*3/uL (ref 150–379)
RBC: 5.38 x10E6/uL (ref 4.14–5.80)
RDW: 14.7 % (ref 12.3–15.4)
WBC: 6.9 10*3/uL (ref 3.4–10.8)

## 2015-02-03 LAB — CMP14+EGFR
A/G RATIO: 1.7 (ref 1.1–2.5)
ALBUMIN: 4.7 g/dL (ref 3.6–4.8)
ALT: 23 IU/L (ref 0–44)
AST: 25 IU/L (ref 0–40)
Alkaline Phosphatase: 59 IU/L (ref 39–117)
BILIRUBIN TOTAL: 0.4 mg/dL (ref 0.0–1.2)
BUN / CREAT RATIO: 21 (ref 10–22)
BUN: 18 mg/dL (ref 8–27)
CHLORIDE: 101 mmol/L (ref 96–106)
CO2: 20 mmol/L (ref 18–29)
Calcium: 9.1 mg/dL (ref 8.6–10.2)
Creatinine, Ser: 0.84 mg/dL (ref 0.76–1.27)
GFR calc non Af Amer: 91 mL/min/{1.73_m2} (ref >59)
GFR, EST AFRICAN AMERICAN: 105 mL/min/{1.73_m2} (ref >59)
Globulin, Total: 2.7 g/dL (ref 1.5–4.5)
Glucose: 151 mg/dL — ABNORMAL HIGH (ref 65–99)
POTASSIUM: 4.3 mmol/L (ref 3.5–5.2)
Sodium: 139 mmol/L (ref 134–144)
TOTAL PROTEIN: 7.4 g/dL (ref 6.0–8.5)

## 2015-02-03 LAB — LIPID PANEL
CHOL/HDL RATIO: 3 ratio (ref 0.0–5.0)
Cholesterol, Total: 148 mg/dL (ref 100–199)
HDL: 49 mg/dL (ref >39)
LDL Calculated: 86 mg/dL (ref 0–99)
Triglycerides: 63 mg/dL (ref 0–149)
VLDL CHOLESTEROL CAL: 13 mg/dL (ref 5–40)

## 2015-04-07 ENCOUNTER — Other Ambulatory Visit: Payer: Self-pay | Admitting: Family Medicine

## 2015-04-08 ENCOUNTER — Ambulatory Visit (INDEPENDENT_AMBULATORY_CARE_PROVIDER_SITE_OTHER): Payer: Medicare Other | Admitting: Family Medicine

## 2015-04-08 ENCOUNTER — Encounter: Payer: Self-pay | Admitting: Family Medicine

## 2015-04-08 VITALS — BP 139/82 | HR 65 | Temp 97.4°F | Ht 72.0 in | Wt 232.0 lb

## 2015-04-08 DIAGNOSIS — M67442 Ganglion, left hand: Secondary | ICD-10-CM

## 2015-04-08 DIAGNOSIS — E119 Type 2 diabetes mellitus without complications: Secondary | ICD-10-CM | POA: Diagnosis not present

## 2015-04-08 MED ORDER — PREGABALIN 50 MG PO CAPS: 50.0000 mg | ORAL_CAPSULE | Freq: Three times a day (TID) | ORAL | Status: DC

## 2015-04-08 MED ORDER — SILDENAFIL CITRATE 20 MG PO TABS: 20.0000 mg | ORAL_TABLET | Freq: Every day | ORAL | Status: DC

## 2015-04-08 MED ORDER — LISINOPRIL 10 MG PO TABS: 10.0000 mg | ORAL_TABLET | Freq: Every day | ORAL | Status: DC

## 2015-04-08 MED ORDER — METFORMIN HCL 1000 MG PO TABS: ORAL_TABLET | ORAL | Status: DC

## 2015-04-08 NOTE — Progress Notes (Signed)
Subjective:  Patient ID: Garrett Mcknight, male    DOB: 11/10/1950  Age: 65 y.o. MRN: [id number]  CC: growths on hands   HPI Garrett Mcknight presents for increasing size of growth in left palm. PResent long term, but now beginning to enlarge and causes pain with gripping the steering wheel. There is a second, smaller area that started more recently in th e left palm. Not painful.   Additionally, pt states he has pain in the left great toe at the base of the nail. Started yesterday. Concerned due to DM and foot care. Also has had multiple ingrown nails since age 89. Concerned that sores could cause damage. History Brok has a past medical history of Diabetes ([place]); Seasonal allergies; Sleep apnea; Arthritis; GERD (gastroesophageal reflux disease); and Hypertension.   He has past surgical history that includes L5 lumbar surgery; Rhinophyma resection; Right knee meniscus; Right wrist reconstruction; Left knee ACL repaired; Left knee replacement; Left bicep tendoesis; Left rotary cuff repair; fibroma; and Total knee arthroplasty (Right, 06/01/2014).   His family history includes Breast cancer in his sister; Diabetes in his maternal grandmother; Diabetes Mellitus II in his father; Stroke in his maternal grandfather.He reports that he has never smoked. He does not have any smokeless tobacco history on file. He reports that he drinks alcohol. He reports that he does not use illicit drugs.    ROS Review of Systems  Constitutional: Negative for fever, chills and diaphoresis.  HENT: Negative for rhinorrhea and sore throat.   Respiratory: Negative for cough and shortness of breath.   Cardiovascular: Negative for chest pain.  Gastrointestinal: Negative for abdominal pain.  Musculoskeletal: Negative for myalgias and arthralgias.  Skin: Negative for rash.  Neurological: Negative for weakness and headaches.    Objective:  BP 139/82 mmHg  Pulse 65  Temp(Src) 97.4 F (36.3 C) (Oral)  Ht 6' (1.829  m)  Wt 232 lb (105.235 kg)  BMI 31.46 kg/m2  SpO2 97%  BP Readings from Last 3 Encounters:  04/08/15 139/82  02/02/15 111/70  06/03/14 135/73    Wt Readings from Last 3 Encounters:  04/08/15 232 lb (105.235 kg)  02/02/15 225 lb (102.059 kg)  06/01/14 218 lb (98.884 kg)     Physical Exam  Constitutional: He appears well-developed and well-nourished.  HENT:  Head: Normocephalic and atraumatic.  Right Ear: Tympanic membrane and external ear normal. No decreased hearing is noted.  Left Ear: Tympanic membrane and external ear normal. No decreased hearing is noted.  Mouth/Throat: No oropharyngeal exudate or posterior oropharyngeal erythema.  Eyes: Pupils are equal, round, and reactive to light.  Neck: Normal range of motion. Neck supple.  Cardiovascular: Normal rate and regular rhythm.   No murmur heard. Pulmonary/Chest: Breath sounds normal. No respiratory distress.  Abdominal: Soft. Bowel sounds are normal. He exhibits no mass. There is no tenderness.  Musculoskeletal:  4X 10 mm ganglion at mid palm, left hand. Connected to the 4th finger flexor. 4 mm round ganglion at 2nd flexor near thenar eminence.Both move with fingerss   Neurological: He is alert.  Skin:  Left foot has ingrown area at base laterally at 1st nail  Vitals reviewed.    Lab Results  Component Value Date   WBC 6.9 02/02/2015   HGB 13.0 06/03/2014   HCT 43.1 02/02/2015   PLT 225 02/02/2015   GLUCOSE 151* 02/02/2015   CHOL 148 02/02/2015   TRIG 63 02/02/2015   HDL 49 02/02/2015   LDLCALC 86 02/02/2015   ALT 23  02/02/2015   AST 25 02/02/2015   NA 139 02/02/2015   K 4.3 02/02/2015   CL 101 02/02/2015   CREATININE 0.84 02/02/2015   BUN 18 02/02/2015   CO2 20 02/02/2015   INR 1.14 05/26/2014   HGBA1C 6.8 02/02/2015    Dg Chest 2 View  05/26/2014  CLINICAL DATA:  Preoperative exam prior to total knee joint replacement ; history of sleep apnea and gastroesophageal reflux EXAM: CHEST  2 VIEW  COMPARISON:  None FINDINGS: The lungs are adequately inflated and clear. The heart and pulmonary vascularity are normal. The mediastinum is normal in width. There is no pleural effusion. There is calcification of the anterior longitudinal ligament of the thoracic spine. The thoracic vertebral bodies are preserved in height. There is an old fracture of the midshaft of the right clavicle. IMPRESSION: There is no active cardiopulmonary disease. Electronically Signed   By: Keoni  Martinique M.D.   On: 05/26/2014 17:06    Assessment & Plan:   Garrett Mcknight was seen today for growths on hands.  Diagnoses and all orders for this visit:  Ganglion of flexor tendon sheath of left ring finger -     Ambulatory referral to Hand Surgery  Controlled type 2 diabetes mellitus without complication, without long-term current use of insulin (HCC)     Due to diabetes and tendency to ingrown nails, pt. Needs relationship with podiatry. I recommended Dr. Irving Shows for regular nail care.   I am having Garrett Mcknight maintain his fluticasone, fexofenadine, cycloSPORINE, metFORMIN, pregabalin, sildenafil, and lisinopril.  No orders of the defined types were placed in this encounter.     Follow-up: Return in about 1 month (around 05/08/2015) for diabetes.  Garrett Mcknight, M.D.

## 2015-04-08 NOTE — Telephone Encounter (Signed)
Pt is aware and he has an appt this morning, so will pick it up then.

## 2015-04-26 DIAGNOSIS — R2232 Localized swelling, mass and lump, left upper limb: Secondary | ICD-10-CM | POA: Diagnosis not present

## 2015-05-05 ENCOUNTER — Other Ambulatory Visit: Payer: Medicare Other

## 2015-05-05 DIAGNOSIS — Z0000 Encounter for general adult medical examination without abnormal findings: Secondary | ICD-10-CM

## 2015-05-05 DIAGNOSIS — E118 Type 2 diabetes mellitus with unspecified complications: Secondary | ICD-10-CM

## 2015-05-05 DIAGNOSIS — I10 Essential (primary) hypertension: Secondary | ICD-10-CM

## 2015-05-05 LAB — LIPID PANEL
CHOL/HDL RATIO: 3.3 ratio (ref 0.0–5.0)
CHOLESTEROL TOTAL: 150 mg/dL (ref 100–199)
HDL: 46 mg/dL (ref >39)
LDL CALC: 83 mg/dL (ref 0–99)
TRIGLYCERIDES: 106 mg/dL (ref 0–149)
VLDL CHOLESTEROL CAL: 21 mg/dL (ref 5–40)

## 2015-05-05 LAB — CBC WITH DIFFERENTIAL/PLATELET
Basophils Absolute: 0 10*3/uL (ref 0.0–0.2)
Basos: 1 %
EOS (ABSOLUTE): 0.3 10*3/uL (ref 0.0–0.4)
EOS: 4 %
HEMATOCRIT: 43.8 % (ref 37.5–51.0)
HEMOGLOBIN: 14.4 g/dL (ref 12.6–17.7)
IMMATURE GRANS (ABS): 0 10*3/uL (ref 0.0–0.1)
IMMATURE GRANULOCYTES: 0 %
LYMPHS ABS: 2.6 10*3/uL (ref 0.7–3.1)
LYMPHS: 35 %
MCH: 27.5 pg (ref 26.6–33.0)
MCHC: 32.9 g/dL (ref 31.5–35.7)
MCV: 84 fL (ref 79–97)
MONOCYTES: 9 %
Monocytes Absolute: 0.7 10*3/uL (ref 0.1–0.9)
NEUTROS PCT: 51 %
Neutrophils Absolute: 3.7 10*3/uL (ref 1.4–7.0)
Platelets: 236 10*3/uL (ref 150–379)
RBC: 5.24 x10E6/uL (ref 4.14–5.80)
RDW: 14.1 % (ref 12.3–15.4)
WBC: 7.3 10*3/uL (ref 3.4–10.8)

## 2015-05-05 LAB — CMP14+EGFR
ALBUMIN: 4.3 g/dL (ref 3.6–4.8)
ALK PHOS: 65 IU/L (ref 39–117)
ALT: 34 IU/L (ref 0–44)
AST: 28 IU/L (ref 0–40)
Albumin/Globulin Ratio: 1.7 (ref 1.2–2.2)
BUN / CREAT RATIO: 22 (ref 10–24)
BUN: 18 mg/dL (ref 8–27)
Bilirubin Total: 0.4 mg/dL (ref 0.0–1.2)
CO2: 22 mmol/L (ref 18–29)
CREATININE: 0.81 mg/dL (ref 0.76–1.27)
Calcium: 9.7 mg/dL (ref 8.6–10.2)
Chloride: 101 mmol/L (ref 96–106)
GFR calc Af Amer: 107 mL/min/{1.73_m2} (ref >59)
GFR calc non Af Amer: 93 mL/min/{1.73_m2} (ref >59)
GLUCOSE: 161 mg/dL — AB (ref 65–99)
Globulin, Total: 2.6 g/dL (ref 1.5–4.5)
Potassium: 5.1 mmol/L (ref 3.5–5.2)
Sodium: 141 mmol/L (ref 134–144)
Total Protein: 6.9 g/dL (ref 6.0–8.5)

## 2015-05-05 LAB — BAYER DCA HB A1C WAIVED: HB A1C (BAYER DCA - WAIVED): 7.4 % — ABNORMAL HIGH (ref <7.0)

## 2015-05-10 NOTE — Therapy (Signed)
[facility] [place], [place], [address] Phone: [phone]   Fax:  [phone]  Physical Therapy Treatment  Patient Details  Name: Garrett Mcknight MRN: [id number] Date of Birth: 1950/04/01 No Data Recorded  Encounter Date: 07/05/2014    Past Medical History  Diagnosis Date  . Diabetes ([place])   . Seasonal allergies   . Sleep apnea     uses CPAP  . Arthritis   . GERD (gastroesophageal reflux disease)   . Hypertension     Past Surgical History  Procedure Laterality Date  . L5 lumbar surgery    . Rhinophyma resection    . Right knee meniscus    . Right wrist reconstruction    . Left knee acl repaired    . Left knee replacement    . Left bicep tendoesis    . Left rotary cuff repair    . Fibroma      removed from scalp 35 years ago  . Total knee arthroplasty Right 06/01/2014    Procedure: RIGHT TOTAL KNEE ARTHROPLASTY;  Surgeon: Latanya Maudlin, MD;  Location: [facility];  Service: Orthopedics;  Laterality: Right;    There were no vitals filed for this visit.                                 PT Short Term Goals - 06/23/14 0909    PT SHORT TERM GOAL #1   Title Ind with initial HEP.   Time 2   Period Weeks   Status Achieved           PT Long Term Goals - 06/23/14 0909    PT LONG TERM GOAL #1   Title Ind with an advanced HEP.   Time 4   Period Weeks   Status On-going   PT LONG TERM GOAL #2   Title Achieve full right knee extension to normalize gait.   Time 4   Period Weeks   Status On-going   PT LONG TERM GOAL #3   Title Achieve active right knee flexion to 115 degrees+ to increase function.   Time 4   Period Weeks   Status On-going   PT LONG TERM GOAL #4   Title Perform a reciprocating stair gait with one railing.   Time 4   Period Weeks   Status On-going   PT LONG TERM GOAL #5   Title Walk a community distance without assistive device and pain not > 3/10.   Time 4   Period Weeks   Status On-going             Patient will benefit from skilled therapeutic intervention in order to improve the following deficits and impairments:  Abnormal gait, Decreased activity tolerance, Decreased strength, Increased edema, Pain  Visit Diagnosis: Right knee pain  Knee stiffness, right     Problem List Patient Active Problem List   Diagnosis Date Noted  . 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 Obstructive sleep apnea 04/29/2014   PHYSICAL THERAPY DISCHARGE SUMMARY  Visits from Start of Care: 7  Current functional level related to goals / functional outcomes: Please see above.   Remaining deficits: Continued loss of knee ROM and pain with walking.   Education / Equipment: HEP.  Plan: Patient agrees to discharge.  Patient goals were not met. Patient is being discharged due to not returning since the last visit.  ?????  Mcknight, Garrett MPT 05/10/2015, 6:33 PM  [facility] [address] [place], [place], [address] Phone: [phone]   Fax:  [phone]  Name: Garrett Mcknight MRN: [id number] Date of Birth: 16-Feb-1950

## 2015-05-16 ENCOUNTER — Other Ambulatory Visit: Payer: Self-pay | Admitting: Orthopedic Surgery

## 2015-05-16 DIAGNOSIS — M729 Fibroblastic disorder, unspecified: Secondary | ICD-10-CM | POA: Diagnosis not present

## 2015-05-16 DIAGNOSIS — M720 Palmar fascial fibromatosis [Dupuytren]: Secondary | ICD-10-CM | POA: Diagnosis not present

## 2015-05-16 DIAGNOSIS — R2232 Localized swelling, mass and lump, left upper limb: Secondary | ICD-10-CM | POA: Diagnosis not present

## 2015-05-17 ENCOUNTER — Ambulatory Visit: Payer: Medicare Other | Admitting: Family Medicine

## 2015-05-27 DIAGNOSIS — R2232 Localized swelling, mass and lump, left upper limb: Secondary | ICD-10-CM | POA: Diagnosis not present

## 2015-05-27 DIAGNOSIS — Z4789 Encounter for other orthopedic aftercare: Secondary | ICD-10-CM | POA: Diagnosis not present

## 2015-06-28 DIAGNOSIS — Z4789 Encounter for other orthopedic aftercare: Secondary | ICD-10-CM | POA: Diagnosis not present

## 2015-06-28 DIAGNOSIS — R2232 Localized swelling, mass and lump, left upper limb: Secondary | ICD-10-CM | POA: Diagnosis not present

## 2015-08-15 ENCOUNTER — Telehealth: Payer: Self-pay | Admitting: Family Medicine

## 2015-09-21 DIAGNOSIS — N529 Male erectile dysfunction, unspecified: Secondary | ICD-10-CM | POA: Insufficient documentation

## 2015-09-21 DIAGNOSIS — L237 Allergic contact dermatitis due to plants, except food: Secondary | ICD-10-CM | POA: Diagnosis not present

## 2015-09-21 DIAGNOSIS — E041 Nontoxic single thyroid nodule: Secondary | ICD-10-CM | POA: Diagnosis not present

## 2015-09-21 DIAGNOSIS — E119 Type 2 diabetes mellitus without complications: Secondary | ICD-10-CM | POA: Diagnosis not present

## 2015-09-21 DIAGNOSIS — M159 Polyosteoarthritis, unspecified: Secondary | ICD-10-CM | POA: Diagnosis not present

## 2015-09-21 DIAGNOSIS — G8929 Other chronic pain: Secondary | ICD-10-CM | POA: Insufficient documentation

## 2015-09-21 DIAGNOSIS — G4720 Circadian rhythm sleep disorder, unspecified type: Secondary | ICD-10-CM | POA: Insufficient documentation

## 2015-09-21 DIAGNOSIS — I10 Essential (primary) hypertension: Secondary | ICD-10-CM | POA: Diagnosis not present

## 2015-09-21 DIAGNOSIS — G4733 Obstructive sleep apnea (adult) (pediatric): Secondary | ICD-10-CM | POA: Diagnosis not present

## 2015-09-21 DIAGNOSIS — Z8639 Personal history of other endocrine, nutritional and metabolic disease: Secondary | ICD-10-CM | POA: Insufficient documentation

## 2015-10-19 DIAGNOSIS — E119 Type 2 diabetes mellitus without complications: Secondary | ICD-10-CM | POA: Diagnosis not present

## 2015-10-19 DIAGNOSIS — Z8719 Personal history of other diseases of the digestive system: Secondary | ICD-10-CM | POA: Diagnosis not present

## 2015-10-19 DIAGNOSIS — I10 Essential (primary) hypertension: Secondary | ICD-10-CM | POA: Diagnosis not present

## 2015-10-19 DIAGNOSIS — Z23 Encounter for immunization: Secondary | ICD-10-CM | POA: Diagnosis not present

## 2015-10-19 DIAGNOSIS — Z8639 Personal history of other endocrine, nutritional and metabolic disease: Secondary | ICD-10-CM | POA: Diagnosis not present

## 2015-10-19 DIAGNOSIS — F4323 Adjustment disorder with mixed anxiety and depressed mood: Secondary | ICD-10-CM | POA: Diagnosis not present

## 2015-11-02 IMAGING — CR DG CHEST 2V
2 series · 2 of 2 positions shown · non-contrast
Comparison: None

CLINICAL DATA: Preoperative exam prior to total knee joint
replacement ; history of sleep apnea and gastroesophageal reflux

EXAM:
CHEST  2 VIEW

[w chest pa *]
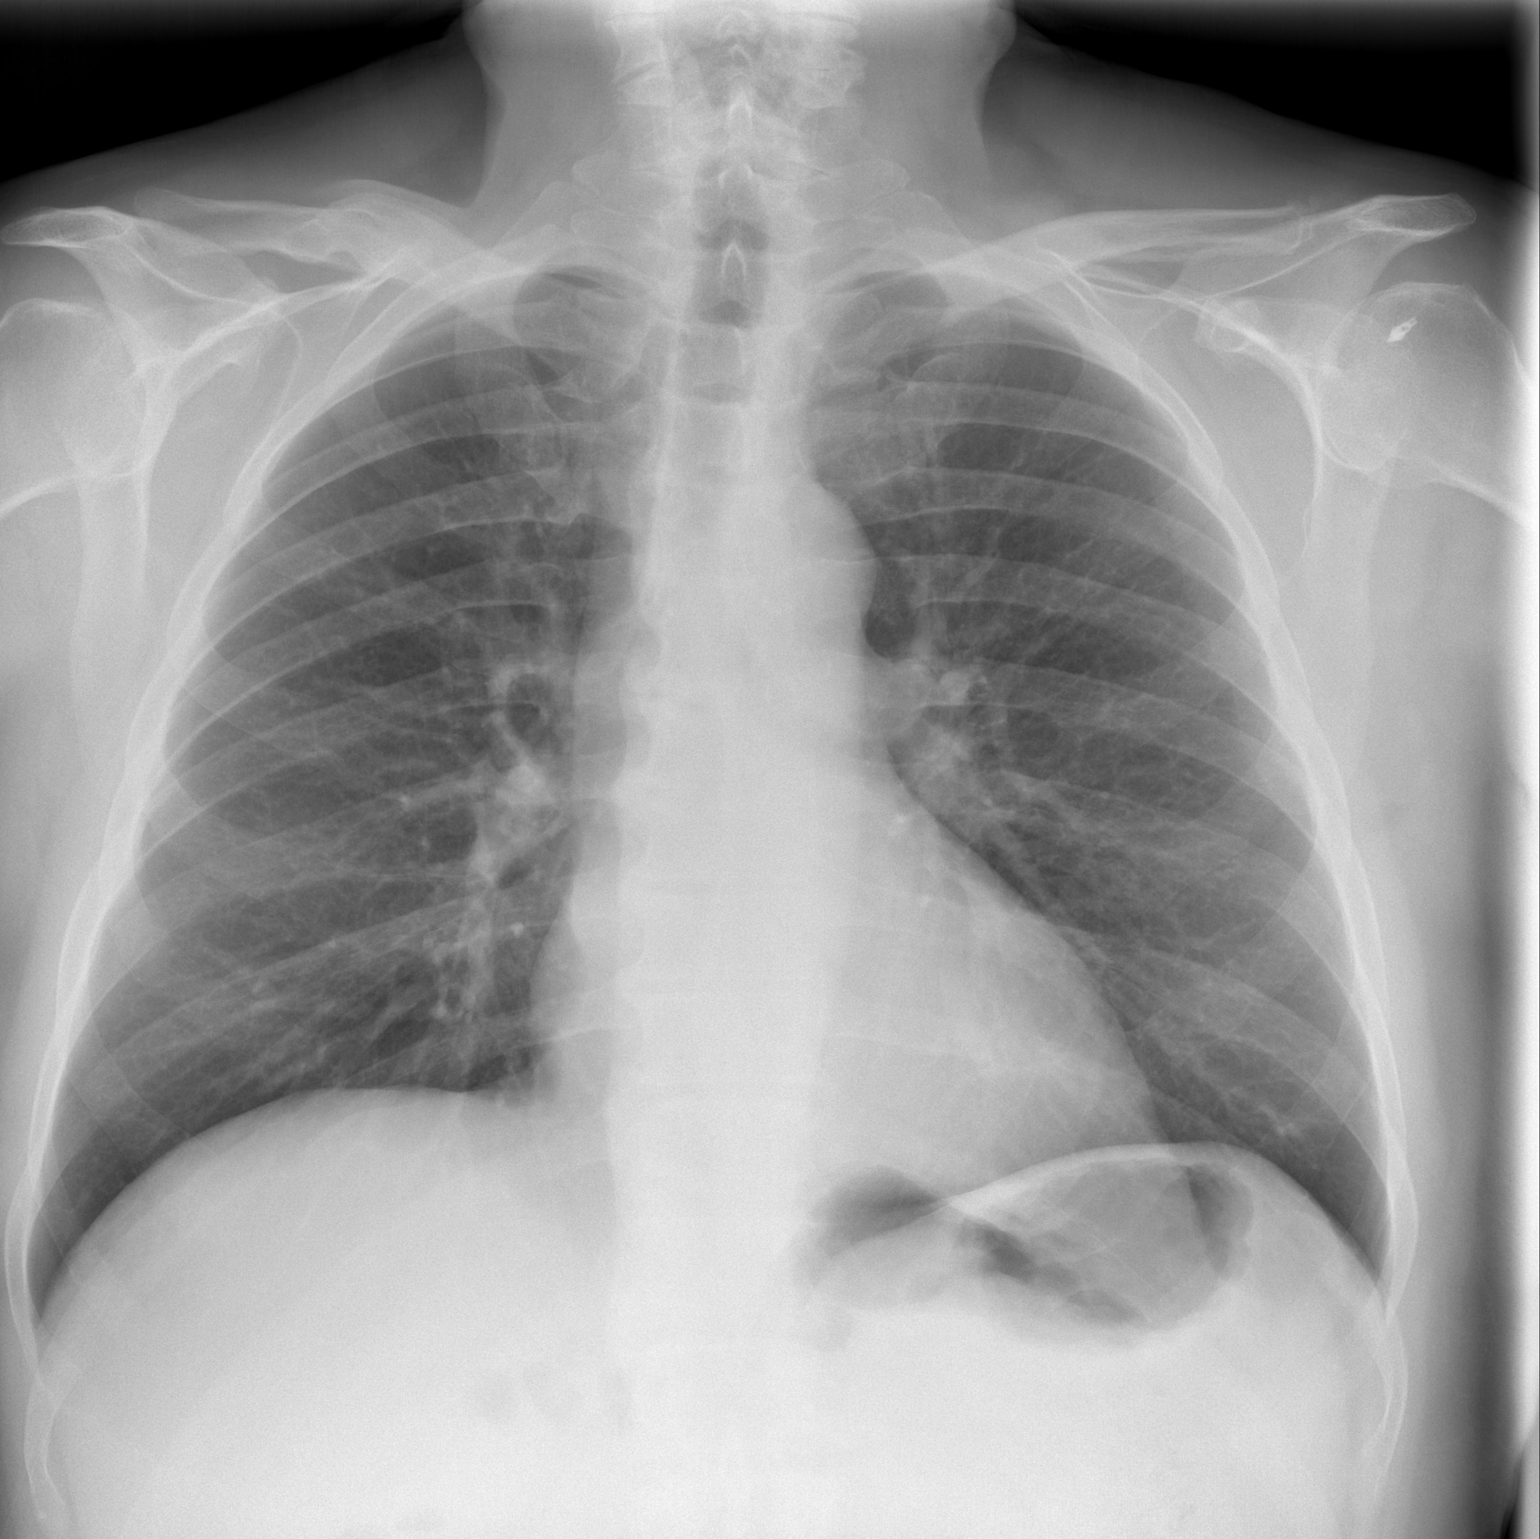

[w chest lat]
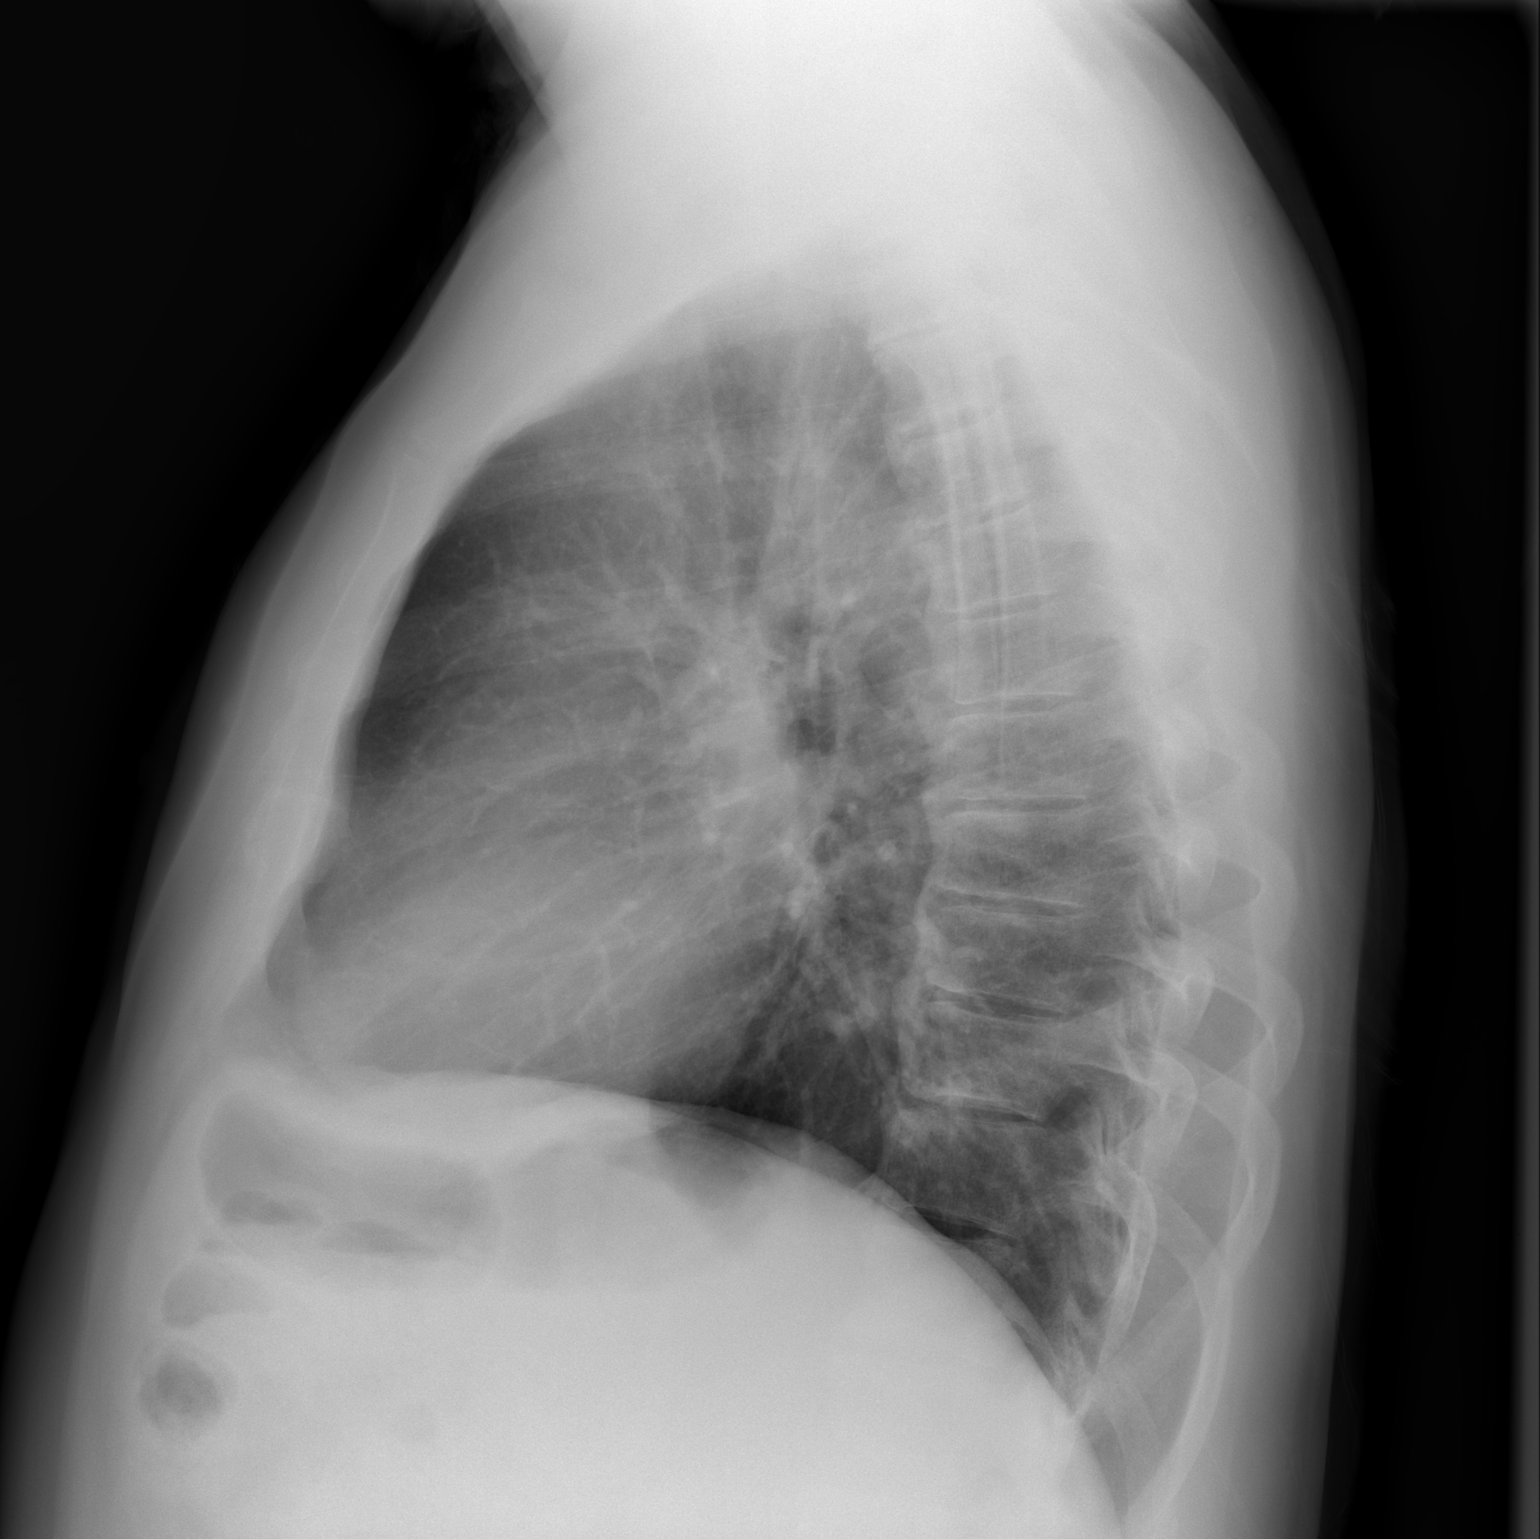

[2 of 2 positions shown; findings below may reference images not displayed]

FINDINGS: The lungs are adequately inflated and clear. The heart and pulmonary
vascularity are normal. The mediastinum is normal in width. There is
no pleural effusion. There is calcification of the anterior
longitudinal ligament of the thoracic spine. The thoracic vertebral
bodies are preserved in height. There is an old fracture of the
midshaft of the right clavicle.
IMPRESSION: There is no active cardiopulmonary disease.

## 2016-01-16 ENCOUNTER — Encounter (HOSPITAL_COMMUNITY): Payer: Self-pay

## 2016-01-16 DIAGNOSIS — S61210A Laceration without foreign body of right index finger without damage to nail, initial encounter: Secondary | ICD-10-CM | POA: Diagnosis not present

## 2016-01-16 DIAGNOSIS — I10 Essential (primary) hypertension: Secondary | ICD-10-CM | POA: Diagnosis not present

## 2016-01-16 DIAGNOSIS — Z79899 Other long term (current) drug therapy: Secondary | ICD-10-CM | POA: Diagnosis not present

## 2016-01-16 DIAGNOSIS — Y9389 Activity, other specified: Secondary | ICD-10-CM | POA: Insufficient documentation

## 2016-01-16 DIAGNOSIS — S61011A Laceration without foreign body of right thumb without damage to nail, initial encounter: Secondary | ICD-10-CM | POA: Diagnosis not present

## 2016-01-16 DIAGNOSIS — S61200A Unspecified open wound of right index finger without damage to nail, initial encounter: Secondary | ICD-10-CM | POA: Diagnosis not present

## 2016-01-16 DIAGNOSIS — Y999 Unspecified external cause status: Secondary | ICD-10-CM | POA: Insufficient documentation

## 2016-01-16 DIAGNOSIS — S6991XA Unspecified injury of right wrist, hand and finger(s), initial encounter: Secondary | ICD-10-CM | POA: Diagnosis present

## 2016-01-16 DIAGNOSIS — Y929 Unspecified place or not applicable: Secondary | ICD-10-CM | POA: Insufficient documentation

## 2016-01-16 DIAGNOSIS — W290XXA Contact with powered kitchen appliance, initial encounter: Secondary | ICD-10-CM | POA: Insufficient documentation

## 2016-01-16 DIAGNOSIS — Z7984 Long term (current) use of oral hypoglycemic drugs: Secondary | ICD-10-CM | POA: Insufficient documentation

## 2016-01-16 DIAGNOSIS — E119 Type 2 diabetes mellitus without complications: Secondary | ICD-10-CM | POA: Insufficient documentation

## 2016-01-16 NOTE — ED Triage Notes (Signed)
Sliced the side of my right thumb off and cut the tip of my right index finger.

## 2016-01-17 ENCOUNTER — Emergency Department (HOSPITAL_COMMUNITY)
Admission: EM | Admit: 2016-01-17 | Discharge: 2016-01-17 | Disposition: A | Discharge status: Home / Self Care | Payer: Medicare Other | Attending: Emergency Medicine | Admitting: Emergency Medicine

## 2016-01-17 ENCOUNTER — Emergency Department (HOSPITAL_COMMUNITY): Payer: Medicare Other

## 2016-01-17 DIAGNOSIS — S61001A Unspecified open wound of right thumb without damage to nail, initial encounter: Secondary | ICD-10-CM

## 2016-01-17 DIAGNOSIS — S61208A Unspecified open wound of other finger without damage to nail, initial encounter: Secondary | ICD-10-CM

## 2016-01-17 DIAGNOSIS — S61011A Laceration without foreign body of right thumb without damage to nail, initial encounter: Secondary | ICD-10-CM | POA: Diagnosis not present

## 2016-01-17 MED ORDER — LIDOCAINE VISCOUS 2 % MT SOLN
OROMUCOSAL | Status: AC
  Filled 2016-01-17: qty 15

## 2016-01-17 NOTE — Discharge Instructions (Signed)
The clotting bandage will need to be removed in 24-36 hrs.  You may need to soak it in saline or tap water before removal.  Keep the area bandaged until the wound heals.  Clean the wounds with mild soap and water.  Return to ER for any signs of infection such as redness, swelling, red streaks or increasing pain.

## 2016-01-17 NOTE — ED Provider Notes (Signed)
[facility] DEPT Provider Note   CSN: [id number] Arrival date & time: 01/16/16  2317     History   Chief Complaint Chief Complaint  Patient presents with  . Finger Injury    HPI Garrett Mcknight is a 66 y.o. male.  HPI   Garrett Mcknight is a 66 y.o. male who presents to the Emergency Department complaining of laceration to his right thumb, index and middle fingers.  He states that he was using a vegetable slicer and accidentally sliced his fingers.  Incident occurred just prior to arrival.  Bleeding controlled with paper towels and direct pressure.  He reports a complete avulsion of the skin of his thumb.  He states he has diminished sensation to the fingers of the right hand secondary to reconstructive surgery of the right wrist, but denies new or worsening numbness of the fingers, blood thinners and swelling. Last Td is up to date  Past Medical History:  Diagnosis Date  . Arthritis   . Diabetes ([place])   . GERD (gastroesophageal reflux disease)   . Hypertension   . Seasonal allergies   . Sleep apnea    uses CPAP    Patient Active Problem List   Diagnosis Date Noted  . 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 Obstructive sleep apnea 04/29/2014    Past Surgical History:  Procedure Laterality Date  . fibroma     removed from scalp 35 years ago  . L5 lumbar surgery    . Left bicep tendoesis    . Left knee ACL repaired    . Left knee replacement    . Left rotary cuff repair    . RHINOPHYMA RESECTION    . Right knee meniscus    . Right wrist reconstruction    . TOTAL KNEE ARTHROPLASTY Right 06/01/2014   Procedure: RIGHT TOTAL KNEE ARTHROPLASTY;  Surgeon: Latanya Maudlin, MD;  Location: [facility];  Service: Orthopedics;  Laterality: Right;       Home Medications    Prior to Admission medications   Medication Sig Start Date End Date Taking? Authorizing Provider  cycloSPORINE (RESTASIS) 0.05 % ophthalmic emulsion Place 1 drop into both eyes 2 (two) times  daily. 02/02/15   [name], MD  fexofenadine (ALLEGRA) 180 MG tablet Take 180 mg by mouth every morning.    Historical Provider, MD  fluticasone (VERAMYST) 27.5 MCG/SPRAY nasal spray Place 1 spray into the nose 2 (two) times daily.     Historical Provider, MD  lisinopril (PRINIVIL,ZESTRIL) 10 MG tablet Take 1 tablet (10 mg total) by mouth daily. 04/08/15   [name], MD  metFORMIN (GLUCOPHAGE) 1000 MG tablet TAKE 1 TABLET (1,000 MG TOTAL) BY MOUTH 2 (TWO) TIMES DAILY WITH A MEAL. 04/08/15   [name], MD  pregabalin (LYRICA) 50 MG capsule Take 1 capsule (50 mg total) by mouth 3 (three) times daily. 04/08/15   [name], MD  sildenafil (REVATIO) 20 MG tablet Take 1 tablet (20 mg total) by mouth daily. 04/08/15   [name], MD    Family History Family History  Problem Relation Age of Onset  . Diabetes Mellitus II Father   . Breast cancer Sister   . Diabetes Maternal Grandmother   . Stroke Maternal Grandfather     Social History Social History  Substance Use Topics  . Smoking status: Never Smoker  . Smokeless tobacco: Never Used  . Alcohol use 0.0 oz/week     Comment: rare     Allergies   Dilaudid [  hydromorphone hcl] and Duloxetine hcl   Review of Systems Review of Systems  Constitutional: Negative for chills and fever.  Musculoskeletal: Negative for arthralgias, back pain and joint swelling.  Skin: Positive for wound.       Laceration of right thumb, index and middle fingers  Neurological: Negative for dizziness and weakness.  Hematological: Does not bruise/bleed easily.  All other systems reviewed and are negative.    Physical Exam Updated Vital Signs BP 139/78 (BP Location: Left Arm)   Pulse 73   Temp 97.8 F (36.6 C) (Temporal)   Resp 17   Ht 6' (1.829 m)   Wt 105.2 kg   SpO2 100%   BMI 31.46 kg/m   Physical Exam  Constitutional: He is oriented to person, place, and time. He appears well-developed and well-nourished. No distress.  HENT:    Head: Normocephalic and atraumatic.  Cardiovascular: Normal rate, regular rhythm and intact distal pulses.   No murmur heard. Pulmonary/Chest: Effort normal and breath sounds normal. No respiratory distress.  Musculoskeletal: Normal range of motion. He exhibits no edema.       Right hand: He exhibits tenderness and laceration. He exhibits normal range of motion, no bony tenderness, normal capillary refill and no swelling. Normal sensation noted. Normal strength noted.       Hands: 3 cm complete avulsion of the skin to the radial aspect of the right thumb,  Nail intact, distal sensation intact.  Minimal bleeding.  Distal finger warm and pink.  < 1 cm superficial avulsions of the skin to the radial aspects of the index and middle fingers.  Bleeding controlled.    Neurological: He is alert and oriented to person, place, and time. He exhibits normal muscle tone. Coordination normal.  Skin: Skin is warm.  Nursing note and vitals reviewed.    ED Treatments / Results  Labs (all labs ordered are listed, but only abnormal results are displayed) Labs Reviewed - No data to display  EKG  EKG Interpretation None       Radiology Dg Finger Thumb Right  Result Date: 01/17/2016 CLINICAL DATA:  Right thumb laceration EXAM: RIGHT THUMB 2+V COMPARISON:  None. FINDINGS: There is bandaging material over right thumb. No fracture or dislocation. The joint spaces are normal. IMPRESSION: Right thumb laceration without associated osseous abnormality. Electronically Signed   By: Ulyses Jarred M.D.   On: 01/17/2016 00:58    Procedures Procedures (including critical care time)  Medications Ordered in ED Medications  lidocaine (XYLOCAINE) 2 % viscous mouth solution (not administered)     Initial Impression / Assessment and Plan / ED Course  I have reviewed the triage vital signs and the nursing notes.  Pertinent labs & imaging results that were available during my care of the patient were reviewed by  me and considered in my medical decision making (see chart for details).  Clinical Course     Wound cleaned with saline.  Bleeding controlled with application of quick clot dressing.  No exposed deep structures of the thumb.  Sensation intact, pt has full ROM of the digits.  Td up to date.   Pt observed, no further bleeding.  Wound care instructions given.  Return precautions also given. Has pain medications at home.  He appears stable for d/c,.    Final Clinical Impressions(s) / ED Diagnoses   Final diagnoses:  Avulsion of skin of right thumb, initial encounter  Avulsion of skin of index finger without complication, initial encounter    New Prescriptions  New Prescriptions   No medications on file     Kem Parkinson, Hershal Coria 01/17/16 0211    Rolland Porter, MD 01/17/16 [phone]

## 2016-01-18 DIAGNOSIS — Z96653 Presence of artificial knee joint, bilateral: Secondary | ICD-10-CM | POA: Diagnosis not present

## 2016-01-18 DIAGNOSIS — G4733 Obstructive sleep apnea (adult) (pediatric): Secondary | ICD-10-CM | POA: Diagnosis not present

## 2016-01-18 DIAGNOSIS — S61011D Laceration without foreign body of right thumb without damage to nail, subsequent encounter: Secondary | ICD-10-CM | POA: Diagnosis not present

## 2016-01-18 DIAGNOSIS — E119 Type 2 diabetes mellitus without complications: Secondary | ICD-10-CM | POA: Diagnosis not present

## 2016-01-18 DIAGNOSIS — Z8719 Personal history of other diseases of the digestive system: Secondary | ICD-10-CM | POA: Diagnosis not present

## 2016-01-18 DIAGNOSIS — R110 Nausea: Secondary | ICD-10-CM | POA: Diagnosis not present

## 2016-01-18 DIAGNOSIS — R21 Rash and other nonspecific skin eruption: Secondary | ICD-10-CM | POA: Diagnosis not present

## 2016-01-18 DIAGNOSIS — Z634 Disappearance and death of family member: Secondary | ICD-10-CM | POA: Diagnosis not present

## 2016-01-18 DIAGNOSIS — R1013 Epigastric pain: Secondary | ICD-10-CM | POA: Diagnosis not present

## 2016-02-01 DIAGNOSIS — L570 Actinic keratosis: Secondary | ICD-10-CM | POA: Diagnosis not present

## 2016-02-01 DIAGNOSIS — L219 Seborrheic dermatitis, unspecified: Secondary | ICD-10-CM | POA: Diagnosis not present

## 2016-04-19 DIAGNOSIS — Z6833 Body mass index (BMI) 33.0-33.9, adult: Secondary | ICD-10-CM | POA: Diagnosis not present

## 2016-04-19 DIAGNOSIS — E669 Obesity, unspecified: Secondary | ICD-10-CM | POA: Diagnosis not present

## 2016-04-19 DIAGNOSIS — E1165 Type 2 diabetes mellitus with hyperglycemia: Secondary | ICD-10-CM | POA: Diagnosis not present

## 2016-04-19 DIAGNOSIS — F4323 Adjustment disorder with mixed anxiety and depressed mood: Secondary | ICD-10-CM | POA: Diagnosis not present

## 2016-04-19 DIAGNOSIS — G4720 Circadian rhythm sleep disorder, unspecified type: Secondary | ICD-10-CM | POA: Diagnosis not present

## 2016-04-19 DIAGNOSIS — G4733 Obstructive sleep apnea (adult) (pediatric): Secondary | ICD-10-CM | POA: Diagnosis not present

## 2016-04-19 DIAGNOSIS — I10 Essential (primary) hypertension: Secondary | ICD-10-CM | POA: Diagnosis not present

## 2016-04-19 DIAGNOSIS — E119 Type 2 diabetes mellitus without complications: Secondary | ICD-10-CM | POA: Diagnosis not present

## 2016-07-19 DIAGNOSIS — E1165 Type 2 diabetes mellitus with hyperglycemia: Secondary | ICD-10-CM | POA: Diagnosis not present

## 2016-07-19 DIAGNOSIS — I10 Essential (primary) hypertension: Secondary | ICD-10-CM | POA: Diagnosis not present

## 2016-07-19 DIAGNOSIS — G4709 Other insomnia: Secondary | ICD-10-CM | POA: Diagnosis not present

## 2016-07-19 DIAGNOSIS — G4733 Obstructive sleep apnea (adult) (pediatric): Secondary | ICD-10-CM | POA: Diagnosis not present

## 2016-07-19 DIAGNOSIS — Z6833 Body mass index (BMI) 33.0-33.9, adult: Secondary | ICD-10-CM | POA: Diagnosis not present

## 2016-07-19 DIAGNOSIS — F4323 Adjustment disorder with mixed anxiety and depressed mood: Secondary | ICD-10-CM | POA: Diagnosis not present

## 2016-07-19 DIAGNOSIS — E669 Obesity, unspecified: Secondary | ICD-10-CM | POA: Diagnosis not present

## 2016-10-12 LAB — BASIC METABOLIC PANEL: GLUCOSE: 123

## 2018-06-05 ENCOUNTER — Encounter (HOSPITAL_COMMUNITY): Payer: Self-pay | Admitting: *Deleted

## 2018-06-05 ENCOUNTER — Emergency Department (HOSPITAL_COMMUNITY)
Admission: EM | Admit: 2018-06-05 | Discharge: 2018-06-05 | Disposition: A | Discharge status: Home / Self Care | Payer: Medicare Other | Attending: Emergency Medicine | Admitting: Emergency Medicine

## 2018-06-05 ENCOUNTER — Other Ambulatory Visit: Payer: Self-pay

## 2018-06-05 ENCOUNTER — Emergency Department (HOSPITAL_COMMUNITY): Payer: Medicare Other

## 2018-06-05 DIAGNOSIS — Z79899 Other long term (current) drug therapy: Secondary | ICD-10-CM | POA: Insufficient documentation

## 2018-06-05 DIAGNOSIS — Z96651 Presence of right artificial knee joint: Secondary | ICD-10-CM | POA: Insufficient documentation

## 2018-06-05 DIAGNOSIS — R0602 Shortness of breath: Secondary | ICD-10-CM | POA: Diagnosis present

## 2018-06-05 DIAGNOSIS — E119 Type 2 diabetes mellitus without complications: Secondary | ICD-10-CM | POA: Insufficient documentation

## 2018-06-05 DIAGNOSIS — R0600 Dyspnea, unspecified: Secondary | ICD-10-CM | POA: Diagnosis not present

## 2018-06-05 DIAGNOSIS — Z20828 Contact with and (suspected) exposure to other viral communicable diseases: Secondary | ICD-10-CM | POA: Diagnosis not present

## 2018-06-05 DIAGNOSIS — I10 Essential (primary) hypertension: Secondary | ICD-10-CM | POA: Insufficient documentation

## 2018-06-05 DIAGNOSIS — Z7984 Long term (current) use of oral hypoglycemic drugs: Secondary | ICD-10-CM | POA: Insufficient documentation

## 2018-06-05 LAB — BASIC METABOLIC PANEL
Anion gap: 10 (ref 5–15)
BUN: 25 mg/dL — ABNORMAL HIGH (ref 8–23)
CO2: 24 mmol/L (ref 22–32)
Calcium: 9.2 mg/dL (ref 8.9–10.3)
Chloride: 102 mmol/L (ref 98–111)
Creatinine, Ser: 0.91 mg/dL (ref 0.61–1.24)
GFR calc Af Amer: >60 mL/min (ref >60)
GFR calc non Af Amer: >60 mL/min (ref >60)
Glucose, Bld: 266 mg/dL — ABNORMAL HIGH (ref 70–99)
Potassium: 3.8 mmol/L (ref 3.5–5.1)
Sodium: 136 mmol/L (ref 135–145)

## 2018-06-05 LAB — CBC WITH DIFFERENTIAL/PLATELET
Abs Immature Granulocytes: 0.04 10*3/uL (ref 0.00–0.07)
Basophils Absolute: 0.1 10*3/uL (ref 0.0–0.1)
Basophils Relative: 1 %
Eosinophils Absolute: 0.1 10*3/uL (ref 0.0–0.5)
Eosinophils Relative: 1 %
HCT: 47.8 % (ref 39.0–52.0)
Hemoglobin: 15.9 g/dL (ref 13.0–17.0)
Immature Granulocytes: 0 %
Lymphocytes Relative: 19 %
Lymphs Abs: 2 10*3/uL (ref 0.7–4.0)
MCH: 29.2 pg (ref 26.0–34.0)
MCHC: 33.3 g/dL (ref 30.0–36.0)
MCV: 87.9 fL (ref 80.0–100.0)
Monocytes Absolute: 0.8 10*3/uL (ref 0.1–1.0)
Monocytes Relative: 7 %
Neutro Abs: 7.6 10*3/uL (ref 1.7–7.7)
Neutrophils Relative %: 72 %
Platelets: 232 10*3/uL (ref 150–400)
RBC: 5.44 MIL/uL (ref 4.22–5.81)
RDW: 12.7 % (ref 11.5–15.5)
WBC: 10.5 10*3/uL (ref 4.0–10.5)
nRBC: 0 % (ref 0.0–0.2)

## 2018-06-05 LAB — SARS CORONAVIRUS 2 BY RT PCR (HOSPITAL ORDER, PERFORMED IN ~~LOC~~ HOSPITAL LAB): SARS Coronavirus 2: NEGATIVE

## 2018-06-05 LAB — BRAIN NATRIURETIC PEPTIDE: B Natriuretic Peptide: 45.1 pg/mL (ref 0.0–100.0)

## 2018-06-05 MED ORDER — SUCRALFATE 1 G PO TABS: 1.0000 g | ORAL_TABLET | Freq: Four times a day (QID) | ORAL | 0 refills | Status: DC | PRN

## 2018-06-05 NOTE — ED Triage Notes (Signed)
Pt present to ER with a c/o sudden onset of shortness of breath and fatigue. Pt also reports bilateral chest discomfort. He sts he usually walks approx 4 miles per day, however yesterday he was hardly able to walk up the stairs. 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

## 2018-06-05 NOTE — ED Notes (Signed)
Spoke with lab, able to add ordered labs to previously collected.

## 2018-06-05 NOTE — ED Provider Notes (Signed)
Assumed care from Dr. Eulis Foster at shift change.  Well-appearing mild shortness of breath, normal vital signs, work-up largely unremarkable, awaiting COVID testing and then discharge.  After the discussed management above, the patient was determined to be safe for discharge.  The patient was in agreement with this plan and all questions regarding their care were answered.  ED return precautions were discussed and the patient will return to the ED with any significant worsening of condition.     Maudie Flakes, MD 06/05/18 [phone]

## 2018-06-05 NOTE — ED Notes (Signed)
ED provider at bedside.

## 2018-06-05 NOTE — ED Notes (Signed)
Basic labs sent to lab on safe tubes.

## 2018-06-05 NOTE — Discharge Instructions (Addendum)
You were evaluated in the Emergency Department and after careful evaluation, we did not find any emergent condition requiring admission or further testing in the hospital.  Your testing today was very reassuring.  Your coronavirus test was negative.  Please return to the Emergency Department if you experience any worsening of your condition.  We encourage you to follow up with a primary care provider.  Thank you for allowing [place] to be a part of your care.

## 2018-06-05 NOTE — ED Notes (Signed)
Pt given and verbalized understanding of d/c instructions and need for follow up with pcp. Told to return if s/s worsen. No further distress or questions at time of ambulation out of department.

## 2018-06-05 NOTE — ED Provider Notes (Signed)
[facility] DEPT Provider Note   CSN: [id number] Arrival date & time: 06/05/18  1354    History   Chief Complaint Chief Complaint  Patient presents with  . Shortness of Breath    HPI Garrett Mcknight is a 68 y.o. male.     HPI   He presents for evaluation of shortness of breath which he first noticed today, while walking.  He feels completely out of breath with any exertion.  Denies fever, chills, cough, nausea, vomiting, focal weakness or paresthesia.  No known exposure to Covid-19 or other illnesses.  He has occasionally had problems similar to this, over the last 2 decades that were caused by bronchitis.  He is on CPAP, and has been typically doing well with that except for 2 nights ago he had some trouble sleeping.  He believes his mask may be leaking some.  He feels better at rest.  He is taking his usual medications without problems.  There are no other known modifying factors.  Past Medical History:  Diagnosis Date  . Arthritis   . Diabetes ([place])   . GERD (gastroesophageal reflux disease)   . Hypertension   . Seasonal allergies   . Sleep apnea    uses CPAP    Patient Active Problem List   Diagnosis Date Noted  . 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 Obstructive sleep apnea 04/29/2014    Past Surgical History:  Procedure Laterality Date  . fibroma     removed from scalp 35 years ago  . L5 lumbar surgery    . Left bicep tendoesis    . Left knee ACL repaired    . Left knee replacement    . Left rotary cuff repair    . RHINOPHYMA RESECTION    . Right knee meniscus    . Right wrist reconstruction    . TOTAL KNEE ARTHROPLASTY Right 06/01/2014   Procedure: RIGHT TOTAL KNEE ARTHROPLASTY;  Surgeon: Latanya Maudlin, MD;  Location: [facility];  Service: Orthopedics;  Laterality: Right;        Home Medications    Prior to Admission medications   Medication Sig Start Date End Date Taking? Authorizing Provider  atorvastatin  (LIPITOR) 10 MG tablet Take 10 mg by mouth daily.   Yes [provider]  fexofenadine (ALLEGRA) 180 MG tablet Take 180 mg by mouth every morning.   Yes [provider]  fluticasone (FLONASE) 50 MCG/ACT nasal spray Place 1 spray into both nostrils daily.   Yes [provider]  glimepiride (AMARYL) 4 MG tablet Take 4 mg by mouth daily after breakfast. 05/01/18  Yes [provider]  lisinopril (ZESTRIL) 20 MG tablet Take 20 mg by mouth daily. 05/01/18  Yes [provider]  metFORMIN (GLUCOPHAGE) 1000 MG tablet TAKE 1 TABLET (1,000 MG TOTAL) BY MOUTH 2 (TWO) TIMES DAILY WITH A MEAL. Patient taking differently: Take 500 mg by mouth 2 (two) times daily with a meal. TAKE 1 TABLET (1,000 MG TOTAL) BY MOUTH 2 (TWO) TIMES DAILY WITH A MEAL. 04/08/15  Yes [name], MD  naproxen (NAPROSYN) 250 MG tablet Take 250 mg by mouth every 7 (seven) days.   Yes [provider]  cycloSPORINE (RESTASIS) 0.05 % ophthalmic emulsion Place 1 drop into both eyes 2 (two) times daily. Patient not taking: Reported on 06/05/2018 02/02/15   [name], MD  fluticasone (VERAMYST) 27.5 MCG/SPRAY nasal spray Place 1 spray into the nose 2 (two) times daily.  [provider]  pregabalin (LYRICA) 50 MG capsule Take 1 capsule (50 mg total) by mouth 3 (three) times daily. Patient not taking: Reported on 06/05/2018 04/08/15   [name], MD  sildenafil (REVATIO) 20 MG tablet Take 1 tablet (20 mg total) by mouth daily. Patient not taking: Reported on 06/05/2018 04/08/15   [name], MD    Family History Family History  Problem Relation Age of Onset  . Diabetes Mellitus II Father   . Breast cancer Sister   . Diabetes Maternal Grandmother   . Stroke Maternal Grandfather     Social History Social History   Tobacco Use  . Smoking status: Never Smoker  . Smokeless tobacco: Never Used  Substance Use Topics  . Alcohol use: Yes    Alcohol/week: 0.0 standard  drinks    Comment: rare  . Drug use: No     Allergies   Dilaudid [hydromorphone hcl]   Review of Systems Review of Systems  All other systems reviewed and are negative.    Physical Exam Updated Vital Signs BP 127/75   Pulse 68   Temp 98.4 F (36.9 C) (Oral)   Resp 16   SpO2 97%   Physical Exam Vitals signs and nursing note reviewed.  Constitutional:      Appearance: He is well-developed.  HENT:     Head: Normocephalic and atraumatic.     Right Ear: External ear normal.     Left Ear: External ear normal.  Eyes:     Conjunctiva/sclera: Conjunctivae normal.     Pupils: Pupils are equal, round, and reactive to light.  Neck:     Musculoskeletal: Normal range of motion and neck supple.     Trachea: Phonation normal.  Cardiovascular:     Rate and Rhythm: Normal rate and regular rhythm.     Heart sounds: Normal heart sounds.  Pulmonary:     Effort: Pulmonary effort is normal. No respiratory distress.     Breath sounds: Normal breath sounds. No stridor. No wheezing or rhonchi.  Abdominal:     General: There is no distension.     Palpations: Abdomen is soft. There is no mass.     Tenderness: There is no abdominal tenderness.  Musculoskeletal: Normal range of motion.     Right lower leg: No edema.     Left lower leg: No edema.  Skin:    General: Skin is warm and dry.  Neurological:     Mental Status: He is alert and oriented to person, place, and time.     Cranial Nerves: No cranial nerve deficit.     Sensory: No sensory deficit.     Motor: No abnormal muscle tone.     Coordination: Coordination normal.  Psychiatric:        Mood and Affect: Mood normal.        Behavior: Behavior normal.        Thought Content: Thought content normal.        Judgment: Judgment normal.      ED Treatments / Results  Labs (all labs ordered are listed, but only abnormal results are displayed) Labs Reviewed  BASIC METABOLIC PANEL - Abnormal; Notable for the following  components:      Result Value   Glucose, Bld 266 (*)    BUN 25 (*)    All other components within normal limits  SARS CORONAVIRUS 2 (HOSPITAL ORDER, [facility])  CBC WITH DIFFERENTIAL/PLATELET  BRAIN NATRIURETIC PEPTIDE  EKG EKG Interpretation  Date/Time:  Thursday Jun 05 2018 14:21:58 EDT Ventricular Rate:  85 PR Interval:    QRS Duration: 98 QT Interval:  357 QTC Calculation: 425 R Axis:   56 Text Interpretation:  Sinus rhythm Abnormal inferior Q waves Borderline repolarization abnormality since last tracing no significant change Confirmed by Daleen Bo [phone]) on 06/05/2018 4:54:25 PM   Radiology Dg Chest Port 1 View  Result Date: 06/05/2018 CLINICAL DATA:  Extreme shortness of breath this morning. EXAM: PORTABLE CHEST 1 VIEW COMPARISON:  Chest x-ray 05/26/2014 FINDINGS: The cardiac silhouette, mediastinal and hilar contours are within normal limits given the AP projection and portable technique. The lungs are clear. No pleural effusion. No worrisome pulmonary lesions. The bony thorax is intact. Remote healed right mid clavicle fracture and evidence of bilateral shoulder surgery. IMPRESSION: No acute cardiopulmonary findings. Electronically Signed   By: Marijo Sanes M.D.   On: 06/05/2018 15:47    Procedures Procedures (including critical care time)  Medications Ordered in ED Medications - No data to display   Initial Impression / Assessment and Plan / ED Course  I have reviewed the triage vital signs and the nursing notes.  Pertinent labs & imaging results that were available during my care of the patient were reviewed by me and considered in my medical decision making (see chart for details).  Clinical Course as of Jun 05 1726  Thu Jun 05, 2018  1653 Normal  CBC with Differential [EW]  1653 Normal  Brain natriuretic peptide [EW]  1653 Normal except glucose high, BUN high  Basic metabolic panel(!) [EW]  0981 No infiltrate or CHF,  image reviewed by me  DG Chest [facility] 1 View [EW]    Clinical Course User Index [EW] Daleen Bo, MD        Patient Vitals for the past 24 hrs:  BP Temp Temp src Pulse Resp SpO2  06/05/18 1700 127/75 - - 68 16 97 %  06/05/18 1630 122/78 - - 63 15 95 %  06/05/18 1600 131/79 - - 70 18 94 %  06/05/18 1530 (!) 142/88 - - 70 16 96 %  06/05/18 1500 116/76 - - 75 15 92 %  06/05/18 1457 122/86 - - 76 14 95 %  06/05/18 1424 (!) 144/92 98.4 F (36.9 C) Oral 85 (!) 22 97 %      Medical Decision Making: Nonspecific shortness of breath, no clinical or ED findings for congestive heart failure, pneumonia or PE.  Screening for Covid-19, ordered.  Incidental mild hyperglycemia present, patient nondiabetic.  This is not fasting test.  Garrett Mcknight was evaluated in Emergency Department on 06/05/2018 for the symptoms described in the history of present illness. He was evaluated in the context of the global COVID-19 pandemic, which necessitated consideration that the patient might be at risk for infection with the SARS-CoV-2 virus that causes COVID-19. Institutional protocols and algorithms that pertain to the evaluation of patients at risk for COVID-19 are in a state of rapid change based on information released by regulatory bodies including the CDC and federal and state organizations. These policies and algorithms were followed during the patient's care in the ED.  CRITICAL CARE-no Performed by: Daleen Bo  Nursing Notes Reviewed/ Care Coordinated Applicable Imaging Reviewed Interpretation of Laboratory Data incorporated into ED treatment   Plan-disposition per oncoming provider team following return of COVID testing  Final Clinical Impressions(s) / ED Diagnoses   Final diagnoses:  Dyspnea, unspecified type    ED Discharge  Orders    None       Daleen Bo, MD 06/05/18 1728

## 2019-09-21 ENCOUNTER — Other Ambulatory Visit: Payer: Self-pay | Admitting: Orthopedic Surgery

## 2019-09-21 ENCOUNTER — Other Ambulatory Visit (HOSPITAL_COMMUNITY): Payer: Self-pay | Admitting: Orthopedic Surgery

## 2019-09-21 DIAGNOSIS — Z96652 Presence of left artificial knee joint: Secondary | ICD-10-CM | POA: Diagnosis not present

## 2019-09-28 ENCOUNTER — Encounter (HOSPITAL_COMMUNITY)
Admission: RE | Admit: 2019-09-28 | Discharge: 2019-09-28 | Disposition: A | Discharge status: Home / Self Care | Payer: Medicare Other | Source: Ambulatory Visit | Attending: Orthopedic Surgery | Admitting: Orthopedic Surgery

## 2019-09-28 ENCOUNTER — Other Ambulatory Visit: Payer: Self-pay

## 2019-09-28 DIAGNOSIS — Z96652 Presence of left artificial knee joint: Secondary | ICD-10-CM | POA: Diagnosis not present

## 2019-09-28 MED ORDER — TECHNETIUM TC 99M MEDRONATE IV KIT
21.8000 | PACK | Freq: Once | INTRAVENOUS | Status: AC | PRN
  Administered 2019-09-28: 21.8 via INTRAVENOUS

## 2019-10-27 ENCOUNTER — Other Ambulatory Visit: Payer: Self-pay | Admitting: Otolaryngology

## 2019-10-27 DIAGNOSIS — R1313 Dysphagia, pharyngeal phase: Secondary | ICD-10-CM

## 2019-11-11 ENCOUNTER — Encounter (HOSPITAL_BASED_OUTPATIENT_CLINIC_OR_DEPARTMENT_OTHER): Payer: Self-pay

## 2019-11-11 DIAGNOSIS — G4733 Obstructive sleep apnea (adult) (pediatric): Secondary | ICD-10-CM

## 2019-11-13 ENCOUNTER — Ambulatory Visit
Admission: RE | Admit: 2019-11-13 | Discharge: 2019-11-13 | Disposition: A | Discharge status: Home / Self Care | Payer: Medicare Other | Source: Ambulatory Visit | Attending: Otolaryngology | Admitting: Otolaryngology

## 2019-11-13 DIAGNOSIS — R1313 Dysphagia, pharyngeal phase: Secondary | ICD-10-CM

## 2019-11-25 DIAGNOSIS — I10 Essential (primary) hypertension: Secondary | ICD-10-CM | POA: Diagnosis not present

## 2019-11-25 DIAGNOSIS — Z792 Long term (current) use of antibiotics: Secondary | ICD-10-CM | POA: Diagnosis not present

## 2019-11-25 DIAGNOSIS — E1169 Type 2 diabetes mellitus with other specified complication: Secondary | ICD-10-CM | POA: Diagnosis not present

## 2019-11-25 DIAGNOSIS — Z01818 Encounter for other preprocedural examination: Secondary | ICD-10-CM | POA: Diagnosis not present

## 2019-11-25 DIAGNOSIS — Z7984 Long term (current) use of oral hypoglycemic drugs: Secondary | ICD-10-CM | POA: Diagnosis not present

## 2019-11-25 DIAGNOSIS — R42 Dizziness and giddiness: Secondary | ICD-10-CM | POA: Diagnosis not present

## 2019-11-26 ENCOUNTER — Other Ambulatory Visit: Payer: Self-pay

## 2019-11-26 ENCOUNTER — Ambulatory Visit: Payer: Medicare Other | Attending: Otolaryngology | Admitting: Neurology

## 2019-11-26 DIAGNOSIS — G4733 Obstructive sleep apnea (adult) (pediatric): Secondary | ICD-10-CM | POA: Diagnosis not present

## 2019-11-26 DIAGNOSIS — Z7984 Long term (current) use of oral hypoglycemic drugs: Secondary | ICD-10-CM | POA: Insufficient documentation

## 2019-11-26 DIAGNOSIS — Z79899 Other long term (current) drug therapy: Secondary | ICD-10-CM | POA: Insufficient documentation

## 2019-11-26 DIAGNOSIS — Z791 Long term (current) use of non-steroidal anti-inflammatories (NSAID): Secondary | ICD-10-CM | POA: Diagnosis not present

## 2019-11-29 NOTE — Procedures (Signed)
   [place] A. Merlene Laughter, MD     [email or web address]             HOME SLEEP STUDY  LOCATION: [facility]  Patient Name: Garrett Mcknight, Garrett Mcknight Date: 11/26/2019 Gender: Male D.O.B: 19-Feb-1950 Age (years): 58 Referring Provider: Melida Quitter Height (inches): 42 Interpreting Physician: Phillips Odor MD, ABSM Weight (lbs): 210 RPSGT: Peak, Robert BMI: 28 MRN: [id number] Neck Size: CLINICAL INFORMATION Sleep Study Type: HST     Indication for sleep study: N/A     Epworth Sleepiness Score: N/A  SLEEP STUDY TECHNIQUE A multi-channel overnight portable sleep study was performed. The channels recorded were: nasal airflow, thoracic respiratory movement, and oxygen saturation with a pulse oximetry. Snoring was also monitored.  MEDICATIONS Patient self administered medications include: N/A.  Current Outpatient Medications:  .  atorvastatin (LIPITOR) 10 MG tablet, Take 10 mg by mouth daily., Disp: , Rfl:  .  cycloSPORINE (RESTASIS) 0.05 % ophthalmic emulsion, Place 1 drop into both eyes 2 (two) times daily. (Patient not taking: Reported on 06/05/2018), Disp: 0.4 mL, Rfl: 11 .  fexofenadine (ALLEGRA) 180 MG tablet, Take 180 mg by mouth every morning., Disp: , Rfl:  .  fluticasone (FLONASE) 50 MCG/ACT nasal spray, Place 1 spray into both nostrils daily., Disp: , Rfl:  .  fluticasone (VERAMYST) 27.5 MCG/SPRAY nasal spray, Place 1 spray into the nose 2 (two) times daily. , Disp: , Rfl:  .  glimepiride (AMARYL) 4 MG tablet, Take 4 mg by mouth daily after breakfast., Disp: , Rfl:  .  lisinopril (ZESTRIL) 20 MG tablet, Take 20 mg by mouth daily., Disp: , Rfl:  .  metFORMIN (GLUCOPHAGE) 1000 MG tablet, TAKE 1 TABLET (1,000 MG TOTAL) BY MOUTH 2 (TWO) TIMES DAILY WITH A MEAL. (Patient taking differently: Take 500 mg by mouth 2 (two) times daily with a meal. TAKE 1 TABLET (1,000 MG TOTAL) BY MOUTH 2 (TWO) TIMES DAILY WITH A MEAL.), Disp: 180 tablet, Rfl: 3 .  naproxen  (NAPROSYN) 250 MG tablet, Take 250 mg by mouth every 7 (seven) days., Disp: , Rfl:  .  pregabalin (LYRICA) 50 MG capsule, Take 1 capsule (50 mg total) by mouth 3 (three) times daily. (Patient not taking: Reported on 06/05/2018), Disp: 90 capsule, Rfl: 5 .  sildenafil (REVATIO) 20 MG tablet, Take 1 tablet (20 mg total) by mouth daily. (Patient not taking: Reported on 06/05/2018), Disp: 30 tablet, Rfl: 11 .  sucralfate (CARAFATE) 1 g tablet, Take 1 tablet (1 g total) by mouth 4 (four) times daily as needed., Disp: 30 tablet, Rfl: 0   SLEEP ARCHITECTURE Patient was studied for 535 minutes. The sleep efficiency was 77.5 % and the patient was supine for 31.6%. The arousal index was 0.0 per hour.  RESPIRATORY PARAMETERS The overall AHI was 5.5 per hour, with a central apnea index of 1.1 per hour.  The oxygen nadir was 81% during sleep.     CARDIAC DATA Mean heart rate during sleep was 58.0 bpm.  IMPRESSIONS Mild obstructive sleep apnea occurred during this study (AHI = 5.5/h).  The severity does not require positive pressure treatment.   Delano Metz, MD Diplomate, American Board of Sleep Medicine.  ELECTRONICALLY SIGNED ON:  11/29/2019, 4:22 PM [place] PH: [phone]   FX: [phone] [place]

## 2019-12-15 ENCOUNTER — Other Ambulatory Visit (HOSPITAL_COMMUNITY): Payer: Medicare Other

## 2019-12-23 ENCOUNTER — Ambulatory Visit: Admit: 2019-12-23 | Payer: Medicare Other | Admitting: Orthopedic Surgery

## 2019-12-23 SURGERY — TOTAL KNEE REVISION
Anesthesia: Choice | Site: Knee | Laterality: Left

## 2020-01-13 DIAGNOSIS — Z96652 Presence of left artificial knee joint: Secondary | ICD-10-CM | POA: Diagnosis not present

## 2020-02-06 DIAGNOSIS — J0100 Acute maxillary sinusitis, unspecified: Secondary | ICD-10-CM | POA: Diagnosis not present

## 2020-07-25 DIAGNOSIS — Z23 Encounter for immunization: Secondary | ICD-10-CM | POA: Diagnosis not present

## 2020-07-25 DIAGNOSIS — U071 COVID-19: Secondary | ICD-10-CM | POA: Diagnosis not present

## 2020-07-28 DIAGNOSIS — H524 Presbyopia: Secondary | ICD-10-CM | POA: Diagnosis not present

## 2020-07-28 DIAGNOSIS — H52223 Regular astigmatism, bilateral: Secondary | ICD-10-CM | POA: Diagnosis not present

## 2020-07-28 DIAGNOSIS — H25813 Combined forms of age-related cataract, bilateral: Secondary | ICD-10-CM | POA: Diagnosis not present

## 2020-07-28 DIAGNOSIS — H5203 Hypermetropia, bilateral: Secondary | ICD-10-CM | POA: Diagnosis not present

## 2020-07-28 DIAGNOSIS — E119 Type 2 diabetes mellitus without complications: Secondary | ICD-10-CM | POA: Diagnosis not present

## 2020-09-06 DIAGNOSIS — K219 Gastro-esophageal reflux disease without esophagitis: Secondary | ICD-10-CM | POA: Diagnosis not present

## 2020-09-06 DIAGNOSIS — Z6829 Body mass index (BMI) 29.0-29.9, adult: Secondary | ICD-10-CM | POA: Diagnosis not present

## 2020-09-06 DIAGNOSIS — E1169 Type 2 diabetes mellitus with other specified complication: Secondary | ICD-10-CM | POA: Diagnosis not present

## 2020-09-06 DIAGNOSIS — Z7984 Long term (current) use of oral hypoglycemic drugs: Secondary | ICD-10-CM | POA: Diagnosis not present

## 2020-09-06 DIAGNOSIS — G629 Polyneuropathy, unspecified: Secondary | ICD-10-CM | POA: Diagnosis not present

## 2020-09-09 DIAGNOSIS — U071 COVID-19: Secondary | ICD-10-CM | POA: Diagnosis not present

## 2020-09-09 DIAGNOSIS — R059 Cough, unspecified: Secondary | ICD-10-CM | POA: Diagnosis not present

## 2020-09-09 DIAGNOSIS — R0981 Nasal congestion: Secondary | ICD-10-CM | POA: Diagnosis not present

## 2020-09-09 DIAGNOSIS — R509 Fever, unspecified: Secondary | ICD-10-CM | POA: Diagnosis not present

## 2020-09-20 ENCOUNTER — Encounter (HOSPITAL_COMMUNITY): Payer: Self-pay

## 2020-09-20 ENCOUNTER — Emergency Department (HOSPITAL_COMMUNITY)
Admission: EM | Admit: 2020-09-20 | Discharge: 2020-09-21 | Disposition: A | Discharge status: Home / Self Care | Payer: Medicare Other | Attending: Emergency Medicine | Admitting: Emergency Medicine

## 2020-09-20 ENCOUNTER — Emergency Department (HOSPITAL_COMMUNITY): Payer: Medicare Other

## 2020-09-20 ENCOUNTER — Other Ambulatory Visit: Payer: Self-pay

## 2020-09-20 DIAGNOSIS — E1165 Type 2 diabetes mellitus with hyperglycemia: Secondary | ICD-10-CM | POA: Diagnosis not present

## 2020-09-20 DIAGNOSIS — R002 Palpitations: Secondary | ICD-10-CM | POA: Diagnosis present

## 2020-09-20 DIAGNOSIS — R42 Dizziness and giddiness: Secondary | ICD-10-CM | POA: Insufficient documentation

## 2020-09-20 DIAGNOSIS — Z79899 Other long term (current) drug therapy: Secondary | ICD-10-CM | POA: Diagnosis not present

## 2020-09-20 DIAGNOSIS — R000 Tachycardia, unspecified: Secondary | ICD-10-CM | POA: Diagnosis not present

## 2020-09-20 DIAGNOSIS — Z7984 Long term (current) use of oral hypoglycemic drugs: Secondary | ICD-10-CM | POA: Diagnosis not present

## 2020-09-20 DIAGNOSIS — I4891 Unspecified atrial fibrillation: Secondary | ICD-10-CM | POA: Diagnosis not present

## 2020-09-20 DIAGNOSIS — Z96651 Presence of right artificial knee joint: Secondary | ICD-10-CM | POA: Insufficient documentation

## 2020-09-20 DIAGNOSIS — I10 Essential (primary) hypertension: Secondary | ICD-10-CM | POA: Diagnosis not present

## 2020-09-20 DIAGNOSIS — R739 Hyperglycemia, unspecified: Secondary | ICD-10-CM

## 2020-09-20 DIAGNOSIS — I4892 Unspecified atrial flutter: Secondary | ICD-10-CM

## 2020-09-20 LAB — CBC
HCT: 46.4 % (ref 39.0–52.0)
Hemoglobin: 15.3 g/dL (ref 13.0–17.0)
MCH: 28.2 pg (ref 26.0–34.0)
MCHC: 33 g/dL (ref 30.0–36.0)
MCV: 85.6 fL (ref 80.0–100.0)
Platelets: 276 10*3/uL (ref 150–400)
RBC: 5.42 MIL/uL (ref 4.22–5.81)
RDW: 12.4 % (ref 11.5–15.5)
WBC: 13.2 10*3/uL — ABNORMAL HIGH (ref 4.0–10.5)
nRBC: 0 % (ref 0.0–0.2)

## 2020-09-20 LAB — BASIC METABOLIC PANEL
Anion gap: 8 (ref 5–15)
BUN: 28 mg/dL — ABNORMAL HIGH (ref 8–23)
CO2: 25 mmol/L (ref 22–32)
Calcium: 9 mg/dL (ref 8.9–10.3)
Chloride: 99 mmol/L (ref 98–111)
Creatinine, Ser: 1.06 mg/dL (ref 0.61–1.24)
GFR, Estimated: >60 mL/min (ref >60)
Glucose, Bld: 423 mg/dL — ABNORMAL HIGH (ref 70–99)
Potassium: 4.2 mmol/L (ref 3.5–5.1)
Sodium: 132 mmol/L — ABNORMAL LOW (ref 135–145)

## 2020-09-20 LAB — MAGNESIUM: Magnesium: 1.8 mg/dL (ref 1.7–2.4)

## 2020-09-20 MED ORDER — METOPROLOL TARTRATE 5 MG/5ML IV SOLN
5.0000 mg | Freq: Once | INTRAVENOUS | Status: AC
  Administered 2020-09-20: 5 mg via INTRAVENOUS
  Filled 2020-09-20: qty 5

## 2020-09-20 NOTE — ED Triage Notes (Signed)
Pt says he had episode of tachycardia about 2 years ago- he was prescribed metoprolol and it helped- dr took him off of that. Pt here today for rapid heart rate after mowing grass. Pt took lisinopril prior to arriving, thinking it might help.

## 2020-09-20 NOTE — ED Provider Notes (Signed)
[facility] EMERGENCY DEPARTMENT Provider Note   CSN: [id number] Arrival date & time: 09/20/20  2238     History Chief Complaint  Patient presents with   elevated heart rate    Garrett Mcknight is a 70 y.o. male.  The history is provided by the patient.  Palpitations Palpitations quality:  Fast Onset quality:  Sudden Duration:  5 hours Timing:  Constant Progression:  Unchanged Chronicity:  New Context: exercise   Relieved by:  Nothing Worsened by:  Nothing Associated symptoms: dizziness   Associated symptoms: no chest pain, no lower extremity edema, no shortness of breath, no syncope and no vomiting   Risk factors: diabetes mellitus   Risk factors: no heart disease and no hx of atrial fibrillation   Patient presents with palpitations and dizziness.  Patient reports after work today, he came home to [place] the grass and he noted his heart was racing fast and he had mild dizziness.  No chest pain/ shortness of breath. No syncope. He reports similar episode approximately 2 years ago prescribed metoprolol.  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   Obstructive sleep apnea 04/29/2014    Past Surgical History:  Procedure Laterality Date   fibroma     removed from scalp 35 years ago   L5 lumbar surgery     Left bicep tendoesis     Left knee ACL repaired     Left knee replacement     Left rotary cuff repair     RHINOPHYMA RESECTION     Right knee meniscus     Right wrist reconstruction     TOTAL KNEE ARTHROPLASTY Right 06/01/2014   Procedure: RIGHT TOTAL KNEE ARTHROPLASTY;  Surgeon: Latanya Maudlin, MD;  Location: [facility];  Service: Orthopedics;  Laterality: Right;        Family History  Problem Relation Age of Onset   Diabetes Mellitus II Father    Breast cancer Sister    Diabetes Maternal Grandmother    Stroke Maternal Grandfather     Social History   Tobacco Use   Smoking status: Never   Smokeless tobacco: Never  Substance Use Topics   Alcohol use: Yes    Alcohol/week: 0.0 standard drinks    Comment: rare   Drug use: No    Home Medications Prior to Admission medications   Medication Sig Start Date End Date Taking? Authorizing Provider  atorvastatin (LIPITOR) 10 MG tablet Take 10 mg by mouth daily.    [provider]  cycloSPORINE (RESTASIS) 0.05 % ophthalmic emulsion Place 1 drop into both eyes 2 (two) times daily. Patient not taking: Reported on 06/05/2018 02/02/15   [name], MD  fexofenadine (ALLEGRA) 180 MG tablet Take 180 mg by mouth every morning.    [provider]  fluticasone (FLONASE) 50 MCG/ACT nasal spray Place 1 spray into both nostrils daily.    [provider]  fluticasone (VERAMYST) 27.5 MCG/SPRAY nasal spray Place 1 spray into the nose 2 (two) times daily.     [provider]  glimepiride (AMARYL) 4 MG tablet Take 4 mg by mouth daily after breakfast. 05/01/18   [provider]  lisinopril (ZESTRIL) 20 MG tablet Take 20 mg by mouth daily. 05/01/18   [provider]  metFORMIN (GLUCOPHAGE) 1000 MG tablet TAKE 1 TABLET (1,000 MG TOTAL) BY MOUTH 2 (TWO) TIMES DAILY WITH A MEAL. Patient taking differently: Take 500 mg by mouth 2 (two) times daily with a meal. TAKE 1 TABLET (1,000 MG TOTAL) BY MOUTH 2 (TWO) TIMES DAILY WITH A MEAL. 04/08/15   [name], MD  naproxen (NAPROSYN) 250 MG tablet Take 250 mg by mouth every 7 (seven) days.    [provider]  pregabalin (LYRICA) 50 MG capsule Take 1 capsule (50 mg total) by mouth 3 (three) times daily. Patient not taking: Reported on 06/05/2018 04/08/15   [name], MD  sildenafil (REVATIO) 20 MG tablet Take 1  tablet (20 mg total) by mouth daily. Patient not taking: Reported on 06/05/2018 04/08/15   [name], MD  sucralfate (CARAFATE) 1 g tablet Take 1 tablet (1 g total) by mouth 4 (four) times daily as needed. 06/05/18   Maudie Flakes, MD    Allergies    Dilaudid [hydromorphone hcl]  Review of Systems   Review of Systems  Constitutional:  Negative for fever.  Respiratory:  Negative for shortness of breath.   Cardiovascular:  Positive for palpitations. Negative for chest pain, leg swelling and syncope.  Gastrointestinal:  Negative for vomiting.  Neurological:  Positive for dizziness. Negative for syncope.  All other systems reviewed and are negative.  Physical Exam Updated Vital Signs BP (!) 115/92 (BP Location: Right Arm)   Pulse (!) 144   Temp 98.2 F (36.8 C) (Oral)   Resp 19   Ht 1.829 m (6')   Wt 92.5 kg   SpO2 98%   BMI 27.67 kg/m   Physical Exam CONSTITUTIONAL: Elderly, no acute distress HEAD: Normocephalic/atraumatic EYES: EOMI/PERRL ENMT: Mucous membranes moist NECK: supple no meningeal signs SPINE/BACK:entire spine nontender CV: Tachycardic and irregular LUNGS: Lungs are clear to auscultation bilaterally, no apparent distress ABDOMEN: soft, nontender, no rebound or guarding, bowel sounds noted throughout abdomen GU:no cva tenderness NEURO: Pt is awake/alert/appropriate, moves all extremitiesx4.  No facial droop.   EXTREMITIES: pulses normal/equal, full ROM, no calf tenderness or edema SKIN: warm, color normal PSYCH: no abnormalities of mood noted, alert and oriented to situation  ED Results / Procedures / Treatments   Labs (all labs ordered are listed, but only abnormal results are displayed) Labs Reviewed  BASIC METABOLIC PANEL - Abnormal; Notable for the following components:      Result Value   Sodium 132 (*)    Glucose, Bld 423 (*)    BUN 28 (*)    All other components within normal limits  CBC - Abnormal; Notable for the following components:    WBC 13.2 (*)    All other components within normal limits  MAGNESIUM    EKG EKG Interpretation  Date/Time:  Tuesday September 20 2020 22:53:39 EDT Ventricular Rate:  144 PR Interval:    QRS Duration: 80 QT Interval:  294 QTC Calculation: 455 R Axis:   65 Text Interpretation: Atrial flutter with variable A-V block Nonspecific ST abnormality Abnormal ECG Confirmed by Ripley Fraise [phone]) on 09/20/2020 11:10:29 PM  Radiology DG Chest Port 1 View  Result Date: 09/20/2020 CLINICAL DATA:  Tachycardia EXAM: PORTABLE CHEST 1 VIEW COMPARISON:  06/05/2018 FINDINGS: The heart size and mediastinal contours are within normal limits. Both lungs are clear. The visualized skeletal structures are unremarkable. IMPRESSION: No active disease. Electronically Signed  By: Randa Ngo M.D.   On: 09/20/2020 23:42    Procedures Procedures   Medications Ordered in ED Medications  apixaban (ELIQUIS) tablet 5 mg (has no administration in time range)  metoprolol tartrate (LOPRESSOR) injection 5 mg (5 mg Intravenous Given 09/20/20 2343)  sodium chloride 0.9 % bolus 1,000 mL (0 mLs Intravenous Stopped 09/21/20 0130)    ED Course  I have reviewed the triage vital signs and the nursing notes.  Pertinent labs & imaging results that were available during my care of the patient were reviewed by me and considered in my medical decision making (see chart for details).    MDM Rules/Calculators/A&P                           Patient presents with palpitations and dizziness and is found to be in atrial flutter with rapid ventricular rate. Patient reports history of fast heart rate previously but was never cardioverted or placed on anticoagulation.  He no longer takes rate control medication Plan to give metoprolol and reassess. 12:11 AM  EKG Interpretation  Date/Time:  Tuesday September 20 2020 23:56:22 EDT Ventricular Rate:  97 PR Interval:    QRS Duration: 97 QT Interval:  316 QTC  Calculation: 402 R Axis:   70 Text Interpretation: Atrial flutter with predominant 3:1 AV block Minimal ST elevation, inferior leads rate is improved on this EKG Confirmed by Ripley Fraise [phone]) on 09/21/2020 12:10:46 AM       Heart rate improved, but still in atrial flutter  2:09 AM Just prior to cardioversion, patient spontaneously converted to normal sinus rhythm.  He did not need electrical cardioversion.  Patient reports feeling at baseline.  He is safe for discharge home.  He will be placed on Eliquis and referral to cardiology.  We discussed bleeding precautions.  We discussed strict  return precautions    EKG Interpretation  Date/Time:  Wednesday September 21 2020 01:35:47 EDT Ventricular Rate:  74 PR Interval:  174 QRS Duration: 97 QT Interval:  370 QTC Calculation: 411 R Axis:   61 Text Interpretation: Sinus rhythm Confirmed by Ripley Fraise [phone]) on 09/21/2020 2:06:08 AM         This patients CHA2DS2-VASc Score and unadjusted Ischemic Stroke Rate (% per year) is equal to 3.2 % stroke rate/year from a score of 3  Above score calculated as 1 point each if present [CHF, HTN, DM, Vascular=MI/PAD/Aortic Plaque, Age if 65-74, or Male] Above score calculated as 2 points each if present [Age > 75, or Stroke/TIA/TE]  Final Clinical Impression(s) / ED Diagnoses Final diagnoses:  Atrial flutter, unspecified type ([place])  Hyperglycemia    Rx / DC Orders ED Discharge Orders          Ordered    apixaban (ELIQUIS) 5 MG TABS tablet  2 times daily        09/21/20 0138    Amb referral to [facility]        09/20/20 2311             Ripley Fraise, MD 09/21/20 0210

## 2020-09-21 DIAGNOSIS — I4892 Unspecified atrial flutter: Secondary | ICD-10-CM | POA: Diagnosis not present

## 2020-09-21 MED ORDER — APIXABAN 5 MG PO TABS
5.0000 mg | ORAL_TABLET | Freq: Two times a day (BID) | ORAL | Status: DC
  Administered 2020-09-21: 5 mg via ORAL
  Filled 2020-09-21: qty 1

## 2020-09-21 MED ORDER — ETOMIDATE 2 MG/ML IV SOLN
5.0000 mg | Freq: Once | INTRAVENOUS | Status: DC
  Filled 2020-09-21: qty 10

## 2020-09-21 MED ORDER — APIXABAN 5 MG PO TABS: 5.0000 mg | ORAL_TABLET | Freq: Two times a day (BID) | ORAL | 0 refills | Status: DC

## 2020-09-21 MED ORDER — SODIUM CHLORIDE 0.9 % IV BOLUS (SEPSIS)
1000.0000 mL | Freq: Once | INTRAVENOUS | Status: AC
  Administered 2020-09-21: 1000 mL via INTRAVENOUS

## 2020-09-21 NOTE — ED Notes (Addendum)
Upon entering room pt was NSR, cardioversion cancelled- Dr Christy Gentles present, ECG captured, pt feeling fine.

## 2020-09-22 DIAGNOSIS — E782 Mixed hyperlipidemia: Secondary | ICD-10-CM | POA: Diagnosis not present

## 2020-09-22 DIAGNOSIS — E1169 Type 2 diabetes mellitus with other specified complication: Secondary | ICD-10-CM | POA: Diagnosis not present

## 2020-09-22 DIAGNOSIS — I10 Essential (primary) hypertension: Secondary | ICD-10-CM | POA: Diagnosis not present

## 2020-09-26 ENCOUNTER — Encounter (HOSPITAL_COMMUNITY): Payer: Self-pay | Admitting: Physician Assistant

## 2020-09-26 ENCOUNTER — Ambulatory Visit (HOSPITAL_COMMUNITY)
Admission: RE | Admit: 2020-09-26 | Discharge: 2020-09-26 | Disposition: A | Discharge status: Home / Self Care | Payer: Medicare Other | Source: Ambulatory Visit | Attending: Physician Assistant | Admitting: Physician Assistant

## 2020-09-26 ENCOUNTER — Other Ambulatory Visit: Payer: Self-pay

## 2020-09-26 VITALS — BP 124/78 | HR 83 | Ht 72.0 in | Wt 209.2 lb

## 2020-09-26 DIAGNOSIS — Z7901 Long term (current) use of anticoagulants: Secondary | ICD-10-CM | POA: Diagnosis not present

## 2020-09-26 DIAGNOSIS — E119 Type 2 diabetes mellitus without complications: Secondary | ICD-10-CM | POA: Diagnosis not present

## 2020-09-26 DIAGNOSIS — Z79899 Other long term (current) drug therapy: Secondary | ICD-10-CM | POA: Insufficient documentation

## 2020-09-26 DIAGNOSIS — I4892 Unspecified atrial flutter: Secondary | ICD-10-CM

## 2020-09-26 DIAGNOSIS — I483 Typical atrial flutter: Secondary | ICD-10-CM | POA: Diagnosis not present

## 2020-09-26 DIAGNOSIS — Z7984 Long term (current) use of oral hypoglycemic drugs: Secondary | ICD-10-CM | POA: Diagnosis not present

## 2020-09-26 DIAGNOSIS — Z9989 Dependence on other enabling machines and devices: Secondary | ICD-10-CM | POA: Diagnosis not present

## 2020-09-26 DIAGNOSIS — Z885 Allergy status to narcotic agent status: Secondary | ICD-10-CM | POA: Insufficient documentation

## 2020-09-26 DIAGNOSIS — D6869 Other thrombophilia: Secondary | ICD-10-CM | POA: Diagnosis not present

## 2020-09-26 DIAGNOSIS — I10 Essential (primary) hypertension: Secondary | ICD-10-CM | POA: Insufficient documentation

## 2020-09-26 DIAGNOSIS — G4733 Obstructive sleep apnea (adult) (pediatric): Secondary | ICD-10-CM | POA: Diagnosis not present

## 2020-09-26 MED ORDER — METOPROLOL SUCCINATE ER 25 MG PO TB24: 25.0000 mg | ORAL_TABLET | Freq: Every day | ORAL | 3 refills | Status: DC

## 2020-09-26 MED ORDER — APIXABAN 5 MG PO TABS: 5.0000 mg | ORAL_TABLET | Freq: Two times a day (BID) | ORAL | 3 refills | Status: DC

## 2020-09-26 NOTE — Addendum Note (Signed)
Encounter addended by: Juluis Mire, RN on: 09/26/2020 10:49 AM  Actions taken: Visit diagnoses modified, Order list changed, Diagnosis association updated

## 2020-09-26 NOTE — Patient Instructions (Signed)
Start metoprolol 25mg once a day at bedtime 

## 2020-09-26 NOTE — Progress Notes (Signed)
Primary Care Physician: System, Provider Not In Primary Cardiologist: none Primary Electrophysiologist: none Referring Physician: [facility]   Garrett Mcknight is a 70 y.o. male with a history of DM, HTN, OSA, atrial flutter who presents for consultation in the [facility].  The patient was initially diagnosed with atrial flutter on 09/20/20 after presenting to the ED with symptoms of palpitations and dizziness. He had a similar episode 2 years prior and was temporarily on metoprolol. Patient was started on Eliquis for a CHADS2VASC score of 3. He spontaneously converted in the ED and did not require DCCV. He does report that he had COVID in early September. He is compliant with his CPAP therapy and denies significant alcohol use.   Today, he denies symptoms of palpitations, chest pain, shortness of breath, orthopnea, PND, lower extremity edema, dizziness, presyncope, syncope, bleeding, or neurologic sequela. The patient is tolerating medications without difficulties and is otherwise without complaint today.    Atrial Fibrillation Risk Factors:  he does have symptoms or diagnosis of sleep apnea. he is compliant with CPAP therapy. he does not have a history of rheumatic fever. he does not have a history of alcohol use. The patient does not have a history of early familial atrial fibrillation or other arrhythmias.  he has a BMI of Body mass index is 28.37 kg/m.Marland Kitchen Filed Weights   09/26/20 0920  Weight: 94.9 kg    Family History  Problem Relation Age of Onset   Diabetes Mellitus II Father    Breast cancer Sister    Diabetes Maternal Grandmother    Stroke Maternal Grandfather      Atrial Fibrillation Management history:  Previous antiarrhythmic drugs: none Previous cardioversions: none Previous ablations: none CHADS2VASC score: 3 Anticoagulation history: Eliquis   Past Medical History:  Diagnosis Date   Arthritis    Diabetes ([place])    GERD  (gastroesophageal reflux disease)    Hypertension    Seasonal allergies    Sleep apnea    uses CPAP   Past Surgical History:  Procedure Laterality Date   fibroma     removed from scalp 35 years ago   L5 lumbar surgery     Left bicep tendoesis     Left knee ACL repaired     Left knee replacement     Left rotary cuff repair     RHINOPHYMA RESECTION     Right knee meniscus     Right wrist reconstruction     TOTAL KNEE ARTHROPLASTY Right 06/01/2014   Procedure: RIGHT TOTAL KNEE ARTHROPLASTY;  Surgeon: Latanya Maudlin, MD;  Location: [facility];  Service: Orthopedics;  Laterality: Right;    Current Outpatient Medications  Medication Sig Dispense Refill   atorvastatin (LIPITOR) 10 MG tablet Take 10 mg by mouth daily.     cycloSPORINE (RESTASIS) 0.05 % ophthalmic emulsion Place 1 drop into both eyes 2 (two) times daily. 0.4 mL 11   fexofenadine (ALLEGRA) 180 MG tablet Take 180 mg by mouth every morning.     fluticasone (FLONASE) 50 MCG/ACT nasal spray Place 1 spray into both nostrils daily.     glimepiride (AMARYL) 4 MG tablet Take 4 mg by mouth daily after breakfast.     lisinopril (ZESTRIL) 20 MG tablet Take 20 mg by mouth daily.     metFORMIN (GLUCOPHAGE-XR) 500 MG 24 hr tablet Take 1,000 mg by mouth 2 (two) times daily.     metoprolol succinate (TOPROL XL) 25 MG 24 hr tablet Take  1 tablet (25 mg total) by mouth at bedtime. 30 tablet 3   pregabalin (LYRICA) 50 MG capsule Take 1 capsule (50 mg total) by mouth 3 (three) times daily. 90 capsule 5   RYBELSUS 3 MG TABS Take 1 tablet by mouth every morning.     sildenafil (REVATIO) 20 MG tablet Take 1 tablet (20 mg total) by mouth daily. 30 tablet 11   apixaban (ELIQUIS) 5 MG TABS tablet Take 1 tablet (5 mg total) by mouth 2 (two) times daily. 60 tablet 3   No current facility-administered medications for this encounter.    Allergies  Allergen Reactions   Dilaudid [Hydromorphone Hcl]     Dry heaves-with IV form    Social History    Socioeconomic History   Marital status: Single    Spouse name: Not on file   Number of children: Not on file   Years of education: Not on file   Highest education level: Not on file  Occupational History   Occupation: Retired   Tobacco Use   Smoking status: Never   Smokeless tobacco: Never  Substance and Sexual Activity   Alcohol use: Yes    Alcohol/week: 0.0 standard drinks    Comment: rare   Drug use: No   Sexual activity: Not on file  Other Topics Concern   Not on file  Social History Narrative   Not on file   Social Determinants of Health   Financial Resource Strain: Not on file  Food Insecurity: Not on file  Transportation Needs: Not on file  Physical Activity: Not on file  Stress: Not on file  Social Connections: Not on file  Intimate Partner Violence: Not on file     ROS- All systems are reviewed and negative except as per the HPI above.  Physical Exam: Vitals:   09/26/20 0920  BP: 124/78  Pulse: 83  Weight: 94.9 kg  Height: 6' (1.829 m)    GEN- The patient is a well appearing male, alert and oriented x 3 today.   Head- normocephalic, atraumatic Eyes-  Sclera clear, conjunctiva pink Ears- hearing intact Oropharynx- clear Neck- supple  Lungs- Clear to ausculation bilaterally, normal work of breathing Heart- Regular rate and rhythm, no murmurs, rubs or gallops  GI- soft, NT, ND, + BS Extremities- no clubbing, cyanosis, or edema MS- no significant deformity or atrophy Skin- no rash or lesion Psych- euthymic mood, full affect Neuro- strength and sensation are intact  Wt Readings from Last 3 Encounters:  09/26/20 94.9 kg  09/20/20 92.5 kg  01/16/16 105.2 kg    EKG today demonstrates  SR, PAC Vent. rate 83 BPM PR interval 148 ms QRS duration 88 ms QT/QTcB 352/413 ms   Epic records are reviewed at length today  CHA2DS2-VASc Score = 3  The patient's score is based upon: CHF History: 0 HTN History: 1 Diabetes History: 1 Stroke  History: 0 Vascular Disease History: 0 Age Score: 1 Gender Score: 0      ASSESSMENT AND PLAN: 1. Atrial flutter The patient's CHA2DS2-VASc score is 3, indicating a 3.2% annual risk of stroke.   General education about atrial flutter provided and questions answered. We also discussed his stroke risk and the risks and benefits of anticoagulation. Continue Eliquis 5 mg BID Check echocardiogram Start Toprol 25 mg daily   2. Secondary Hypercoagulable State (ICD10:  D68.69) The patient is at significant risk for stroke/thromboembolism based upon his CHA2DS2-VASc Score of 3.  Continue Apixaban (Eliquis).   3. Obstructive sleep  apnea The importance of adequate treatment of sleep apnea was discussed today in order to improve our ability to maintain sinus rhythm long term. Patient reports compliance with CPAP therapy.  4. HTN Stable, med changes as above.   Follow up in the AF clinic in one month.    [facility] [address] [place], [place] [address] [phone] 09/26/2020 10:27 AM

## 2020-10-18 ENCOUNTER — Ambulatory Visit (HOSPITAL_COMMUNITY)
Admission: RE | Admit: 2020-10-18 | Discharge: 2020-10-18 | Disposition: A | Discharge status: Home / Self Care | Payer: Medicare Other | Source: Ambulatory Visit | Attending: Internal Medicine | Admitting: Internal Medicine

## 2020-10-18 ENCOUNTER — Other Ambulatory Visit: Payer: Self-pay

## 2020-10-18 DIAGNOSIS — G4730 Sleep apnea, unspecified: Secondary | ICD-10-CM | POA: Diagnosis not present

## 2020-10-18 DIAGNOSIS — E119 Type 2 diabetes mellitus without complications: Secondary | ICD-10-CM | POA: Diagnosis not present

## 2020-10-18 DIAGNOSIS — I4892 Unspecified atrial flutter: Secondary | ICD-10-CM | POA: Diagnosis not present

## 2020-10-18 DIAGNOSIS — I10 Essential (primary) hypertension: Secondary | ICD-10-CM | POA: Insufficient documentation

## 2020-10-18 LAB — ECHOCARDIOGRAM COMPLETE
AR max vel: 4.18 cm2
AV Area VTI: 3.86 cm2
AV Area mean vel: 3.99 cm2
AV Mean grad: 3 mmHg
AV Peak grad: 4.9 mmHg
Ao pk vel: 1.11 m/s
Area-P 1/2: 2.53 cm2
MV VTI: 3.52 cm2
S' Lateral: 3 cm

## 2020-10-18 NOTE — Progress Notes (Signed)
  Echocardiogram 2D Echocardiogram has been performed.  Garrett Mcknight 10/18/2020, 10:42 AM

## 2020-10-24 ENCOUNTER — Encounter (HOSPITAL_COMMUNITY): Payer: Self-pay | Admitting: Physician Assistant

## 2020-10-24 ENCOUNTER — Ambulatory Visit (HOSPITAL_COMMUNITY)
Admission: RE | Admit: 2020-10-24 | Discharge: 2020-10-24 | Disposition: A | Discharge status: Home / Self Care | Payer: Medicare Other | Source: Ambulatory Visit | Attending: Physician Assistant | Admitting: Physician Assistant

## 2020-10-24 ENCOUNTER — Other Ambulatory Visit: Payer: Self-pay

## 2020-10-24 VITALS — BP 118/80 | HR 77 | Ht 72.0 in | Wt 206.6 lb

## 2020-10-24 DIAGNOSIS — Z7901 Long term (current) use of anticoagulants: Secondary | ICD-10-CM | POA: Diagnosis not present

## 2020-10-24 DIAGNOSIS — I4892 Unspecified atrial flutter: Secondary | ICD-10-CM | POA: Diagnosis not present

## 2020-10-24 DIAGNOSIS — Z885 Allergy status to narcotic agent status: Secondary | ICD-10-CM | POA: Diagnosis not present

## 2020-10-24 DIAGNOSIS — D6869 Other thrombophilia: Secondary | ICD-10-CM | POA: Diagnosis not present

## 2020-10-24 DIAGNOSIS — E119 Type 2 diabetes mellitus without complications: Secondary | ICD-10-CM | POA: Insufficient documentation

## 2020-10-24 DIAGNOSIS — I10 Essential (primary) hypertension: Secondary | ICD-10-CM | POA: Diagnosis not present

## 2020-10-24 DIAGNOSIS — I483 Typical atrial flutter: Secondary | ICD-10-CM | POA: Diagnosis not present

## 2020-10-24 DIAGNOSIS — I77819 Aortic ectasia, unspecified site: Secondary | ICD-10-CM | POA: Insufficient documentation

## 2020-10-24 DIAGNOSIS — G4733 Obstructive sleep apnea (adult) (pediatric): Secondary | ICD-10-CM | POA: Diagnosis not present

## 2020-10-24 DIAGNOSIS — Z7984 Long term (current) use of oral hypoglycemic drugs: Secondary | ICD-10-CM | POA: Insufficient documentation

## 2020-10-24 DIAGNOSIS — Z79899 Other long term (current) drug therapy: Secondary | ICD-10-CM | POA: Diagnosis not present

## 2020-10-24 NOTE — Progress Notes (Signed)
Primary Care Physician: System, Provider Not In Primary Cardiologist: none Primary Electrophysiologist: Dr Curt Bears (new) Referring Physician: [facility]   Garrett Mcknight is a 70 y.o. male with a history of DM, HTN, OSA, atrial flutter who presents for follow up in the [facility].  The patient was initially diagnosed with atrial flutter on 09/20/20 after presenting to the ED with symptoms of palpitations and dizziness. He had a similar episode 2 years prior and was temporarily on metoprolol. Patient was started on Eliquis for a CHADS2VASC score of 3. He spontaneously converted in the ED and did not require DCCV. He does report that he had COVID in early September. He is compliant with his CPAP therapy and denies significant alcohol use.   On follow up today, patient reports that he has done well since his last visit. He denies symptoms of tachypalpitations. 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 He denies any bleeding issues on anticoagulation.   Today, he denies symptoms of palpitations, chest pain, shortness of breath, orthopnea, PND, lower extremity edema, presyncope, syncope, bleeding, or neurologic sequela. The patient is tolerating medications without difficulties and is otherwise without complaint today.    Atrial Fibrillation Risk Factors:  he does have symptoms or diagnosis of sleep apnea. he is compliant with CPAP therapy. he does not have a history of rheumatic fever. he does not have a history of alcohol use. The patient does not have a history of early familial atrial fibrillation or other arrhythmias.  he has a BMI of Body mass index is 28.02 kg/m.Marland Kitchen Filed Weights   10/24/20 0929  Weight: 93.7 kg     Family History  Problem Relation Age of Onset   Diabetes Mellitus II Father    Breast cancer Sister    Diabetes Maternal Grandmother    Stroke Maternal Grandfather      Atrial Fibrillation Management  history:  Previous antiarrhythmic drugs: none Previous cardioversions: none Previous ablations: none CHADS2VASC score: 3 Anticoagulation history: Eliquis   Past Medical History:  Diagnosis Date   Arthritis    Diabetes ([place])    GERD (gastroesophageal reflux disease)    Hypertension    Seasonal allergies    Sleep apnea    uses CPAP   Past Surgical History:  Procedure Laterality Date   fibroma     removed from scalp 35 years ago   L5 lumbar surgery     Left bicep tendoesis     Left knee ACL repaired     Left knee replacement     Left rotary cuff repair     RHINOPHYMA RESECTION     Right knee meniscus     Right wrist reconstruction     TOTAL KNEE ARTHROPLASTY Right 06/01/2014   Procedure: RIGHT TOTAL KNEE ARTHROPLASTY;  Surgeon: Latanya Maudlin, MD;  Location: [facility];  Service: Orthopedics;  Laterality: Right;    Current Outpatient Medications  Medication Sig Dispense Refill   apixaban (ELIQUIS) 5 MG TABS tablet Take 1 tablet (5 mg total) by mouth 2 (two) times daily. 60 tablet 3   atorvastatin (LIPITOR) 10 MG tablet Take 10 mg by mouth daily.     cycloSPORINE (RESTASIS) 0.05 % ophthalmic emulsion Place 1 drop into both eyes 2 (two) times daily. 0.4 mL 11   fexofenadine (ALLEGRA) 180 MG tablet Take 180 mg by mouth every morning.     fluticasone (FLONASE) 50 MCG/ACT nasal spray Place 1 spray into both nostrils  daily.     glimepiride (AMARYL) 4 MG tablet Take 4 mg by mouth daily after breakfast.     lisinopril (ZESTRIL) 20 MG tablet Take 20 mg by mouth daily.     metFORMIN (GLUCOPHAGE-XR) 500 MG 24 hr tablet Take 1,000 mg by mouth 2 (two) times daily.     metoprolol succinate (TOPROL XL) 25 MG 24 hr tablet Take 1 tablet (25 mg total) by mouth at bedtime. 30 tablet 3   pregabalin (LYRICA) 50 MG capsule Take 1 capsule (50 mg total) by mouth 3 (three) times daily. 90 capsule 5   RYBELSUS 7 MG TABS Take 1 tablet by mouth daily.     sildenafil (REVATIO) 20 MG tablet Take 1 tablet  (20 mg total) by mouth daily. 30 tablet 11   No current facility-administered medications for this encounter.    Allergies  Allergen Reactions   Dilaudid [Hydromorphone Hcl]     Dry heaves-with IV form    Social History   Socioeconomic History   Marital status: Single    Spouse name: Not on file   Number of children: Not on file   Years of education: Not on file   Highest education level: Not on file  Occupational History   Occupation: Retired   Tobacco Use   Smoking status: Never   Smokeless tobacco: Never  Substance and Sexual Activity   Alcohol use: Yes    Alcohol/week: 0.0 standard drinks    Comment: rare   Drug use: No   Sexual activity: Not on file  Other Topics Concern   Not on file  Social History Narrative   Not on file   Social Determinants of Health   Financial Resource Strain: Not on file  Food Insecurity: Not on file  Transportation Needs: Not on file  Physical Activity: Not on file  Stress: Not on file  Social Connections: Not on file  Intimate Partner Violence: Not on file     ROS- All systems are reviewed and negative except as per the HPI above.  Physical Exam: Vitals:   10/24/20 0929  BP: 118/80  Pulse: 77  Weight: 93.7 kg  Height: 6' (1.829 m)    GEN- The patient is a well appearing male, alert and oriented x 3 today.   HEENT-head normocephalic, atraumatic, sclera clear, conjunctiva pink, hearing intact, trachea midline. Lungs- Clear to ausculation bilaterally, normal work of breathing Heart- Regular rate and rhythm, no murmurs, rubs or gallops  GI- soft, NT, ND, + BS Extremities- no clubbing, cyanosis, or edema MS- no significant deformity or atrophy Skin- no rash or lesion Psych- euthymic mood, full affect Neuro- strength and sensation are intact   Wt Readings from Last 3 Encounters:  10/24/20 93.7 kg  09/26/20 94.9 kg  09/20/20 92.5 kg    EKG today demonstrates  SR, PAC Vent. rate 77 BPM PR interval 162 ms QRS  duration 90 ms QT/QTcB 374/423 ms  Echo 10/18/20 demonstrated   1. Left ventricular ejection fraction, by estimation, is 60 to 65%. The  left ventricle has normal function. The left ventricle has no regional  wall motion abnormalities. Left ventricular diastolic parameters were  normal.   2. Right ventricular systolic function is normal. The right ventricular  size is normal.   3. The mitral valve is normal in structure. No evidence of mitral valve  regurgitation. No evidence of mitral stenosis.   4. The aortic valve is tricuspid. Aortic valve regurgitation is not  visualized. No aortic stenosis is  present.   5. Aortic dilatation noted. There is borderline dilatation of the aortic  root, measuring 38 mm.   6. The inferior vena cava is normal in size with greater than 50%  respiratory variability, suggesting right atrial pressure of 3 mmHg.    Epic records are reviewed at length today  CHA2DS2-VASc Score = 3  The patient's score is based upon: CHF History: 0 HTN History: 1 Diabetes History: 1 Stroke History: 0 Vascular Disease History: 0 Age Score: 1 Gender Score: 0      ASSESSMENT AND PLAN: 1. Atrial flutter The patient's CHA2DS2-VASc score is 3, indicating a 3.2% annual risk of stroke.   Patient in [place], has appointment with Dr Curt Bears to discuss ablation.  Continue Eliquis 5 mg BID Continue Toprol 25 mg daily   2. Secondary Hypercoagulable State (ICD10:  D68.69) The patient is at significant risk for stroke/thromboembolism based upon his CHA2DS2-VASc Score of 3.  Continue Apixaban (Eliquis).   3. Obstructive sleep apnea Patient reports compliance with CPAP therapy.  4. HTN Stable, no changes today.  5. 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 Will need to be followed serially.  Brother had aortic dissection.    Follow up with Dr Curt Bears as scheduled.    [facility] [address] [place], [place]  [address] [phone] 10/24/2020 9:49 AM

## 2020-11-14 ENCOUNTER — Other Ambulatory Visit: Payer: Self-pay

## 2020-11-14 ENCOUNTER — Encounter: Payer: Self-pay | Admitting: Cardiology

## 2020-11-14 ENCOUNTER — Ambulatory Visit (INDEPENDENT_AMBULATORY_CARE_PROVIDER_SITE_OTHER): Payer: Medicare Other | Admitting: Cardiology

## 2020-11-14 VITALS — BP 128/74 | HR 84 | Resp 18 | Ht 72.0 in | Wt 203.0 lb

## 2020-11-14 DIAGNOSIS — Z01818 Encounter for other preprocedural examination: Secondary | ICD-10-CM

## 2020-11-14 DIAGNOSIS — I483 Typical atrial flutter: Secondary | ICD-10-CM

## 2020-11-14 DIAGNOSIS — Z01812 Encounter for preprocedural laboratory examination: Secondary | ICD-10-CM

## 2020-11-14 NOTE — Patient Instructions (Addendum)
Medication Instructions:  Your physician recommends that you continue on your current medications as directed. Please refer to the Current Medication list given to you today.  *If you need a refill on your cardiac medications before your next appointment, please call your pharmacy*   Lab Work: Pre procedure lab work on 01/27/2021.  You do NOT need to be fasting.  You can stop by the [employer] office anytime between 7:30 am - 4:30 pm  If you have labs (blood work) drawn today and your tests are completely normal, you will receive your results only by: [place] (if you have MyChart) OR A paper copy in the mail If you have any lab test that is abnormal or we need to change your treatment, we will call you to review the results.   Testing/Procedures: Your physician has recommended that you have an ablation. Catheter ablation is a medical procedure used to treat some cardiac arrhythmias (irregular heartbeats). During catheter ablation, a long, thin, flexible tube is put into a blood vessel in your groin (upper thigh), or neck. This tube is called an ablation catheter. It is then guided to your heart through the blood vessel. Radio frequency waves destroy small areas of heart tissue where abnormal heartbeats may cause an arrhythmia to start. Please see the instruction sheet below located under "other instructions".    Follow-Up: At [facility], you and your health needs are our priority.  As part of our continuing mission to provide you with exceptional heart care, we have created designated Provider Care Teams.  These Care Teams include your primary Cardiologist (physician) and Advanced Practice Providers (APPs -  Physician Assistants and Nurse Practitioners) who all work together to provide you with the care you need, when you need it.  We recommend signing up for the patient portal called "MyChart".  Sign up information is provided on this After Visit Summary.  MyChart is used to  connect with patients for Virtual Visits (Telemedicine).  Patients are able to view lab/test results, encounter notes, upcoming appointments, etc.  Non-urgent messages can be sent to your provider as well.   To learn more about what you can do with MyChart, go to [email or web address].    Your next appointment:   4 week(s) after your ablation  The format for your next appointment:   In Person  Provider:   You will follow up in the [facility] located at [facility]. Your provider will be: Roderic Palau, NP or [name]. [name], PA-C    Thank you for choosing [facility]!!   Trinidad Curet, RN [phone]   Other Instructions    Electrophysiology/Ablation Procedure Instructions   You are scheduled for a(n)  ablation on 02/03/2021 with Dr. Allegra Lai.   1.   Pre procedure testing-             A.  LAB WORK ---  on 01/27/2021.  You do NOT need to be fasting.  You can stop by the [employer] office anytime between 7:30 am - 4:30 pm  PROCEDURE DAY: On the day of your procedure 02/03/2021 you will go to [facility] hospital ([address]) at 8:30 am.  Dennis Bast will go to the main entrance A [employer]) and enter where the [employer] are.  Your driver will drop you off and you will head down the hallway to ADMITTING.  You may have one support person come in to the hospital with you.  They will  be asked to wait in the waiting room.  It is OK to have someone drop you off and come back when you are ready to be discharged.   3.   Do not eat or drink after midnight prior to your procedure.   4.   Do not miss any doses of your blood thinner prior to the morning of your procedure or your procedure will need to be rescheduled.       Do NOT take any medications the morning of your procedure.   5.  Plan for an overnight stay, but you may be discharged home after your procedure. If you use your phone frequently bring your phone charger, in case you have to  stay.  If you are discharged after your procedure you will need someone to drive you home and be with your for 24 hours after your procedure.   6. You will follow up with Dr. Curt Bears  1 month after your procedure.  This appointment will be made for you.  7. FYI: For your safety, and to allow [place] to monitor your vital signs accurately during the surgery/procedure we request that if you have artificial nails, gel coating, SNS etc. Please have those removed prior to your surgery/procedure. Not having the nail coverings /polish removed may result in cancellation or delay of your surgery/procedure.   * If you have ANY questions please call the office [phone] and ask for Lorna Strother RN or send me a MyChart message   * Occasionally, EP Studies and ablations can become lengthy.  Please make your family aware of this before your procedure starts.  Average time ranges from 2-8 hours for EP studies/ablations.  Your physician will call your family after the procedure with the results.

## 2020-11-14 NOTE — Progress Notes (Signed)
Electrophysiology Office Note   Date:  11/14/2020   ID:  Garrett Mcknight, DOB 1950/11/29, MRN [id number]  PCP:  System, Provider Not In  Cardiologist:   Primary Electrophysiologist:  [name], MD    Chief Complaint: atrial flutter   History of Present Illness: Garrett Mcknight is a 70 y.o. male who is being seen today for the evaluation of atrial flutter at the request of [name], [name], PA. Presenting today for electrophysiology evaluation.  He has a history significant for diabetes, hypertension, OSA, atrial flutter.  He presented to the hospital 09/20/2020 and was diagnosed with atrial flutter that time.  He was started on Eliquis.  He spontaneously converted to emergency room did not require cardioversion.  He has not had any further episodes of atrial flutter since his emergency room visit.  He is compliant with his CPAP and denies significant alcohol abuse.  Today, he denies symptoms of palpitations, chest pain, shortness of breath, orthopnea, PND, lower extremity edema, claudication, dizziness, presyncope, syncope, bleeding, or neurologic sequela. The patient is tolerating medications without difficulties.    Past Medical History:  Diagnosis Date   Arthritis    Diabetes ([place])    GERD (gastroesophageal reflux disease)    Hypertension    Seasonal allergies    Sleep apnea    uses CPAP   Past Surgical History:  Procedure Laterality Date   fibroma     removed from scalp 35 years ago   L5 lumbar surgery     Left bicep tendoesis     Left knee ACL repaired     Left knee replacement     Left rotary cuff repair     RHINOPHYMA RESECTION     Right knee meniscus     Right wrist reconstruction     TOTAL KNEE ARTHROPLASTY Right 06/01/2014   Procedure: RIGHT TOTAL KNEE ARTHROPLASTY;  Surgeon: Latanya Maudlin, MD;  Location: [facility];  Service: Orthopedics;  Laterality: Right;     Current Outpatient Medications  Medication Sig Dispense Refill   amitriptyline (ELAVIL) 10  MG tablet Take 10 mg by mouth at bedtime.     apixaban (ELIQUIS) 5 MG TABS tablet Take 1 tablet (5 mg total) by mouth 2 (two) times daily. 60 tablet 3   atorvastatin (LIPITOR) 10 MG tablet Take 10 mg by mouth daily.     cycloSPORINE (RESTASIS) 0.05 % ophthalmic emulsion Place 1 drop into both eyes 2 (two) times daily. 0.4 mL 11   fluticasone (FLONASE) 50 MCG/ACT nasal spray Place 1 spray into both nostrils daily.     glimepiride (AMARYL) 4 MG tablet Take 4 mg by mouth daily after breakfast.     lisinopril (ZESTRIL) 20 MG tablet Take 20 mg by mouth daily.     loratadine (CLARITIN) 10 MG tablet Take 10 mg by mouth daily.     metFORMIN (GLUCOPHAGE-XR) 500 MG 24 hr tablet Take 1,000 mg by mouth 2 (two) times daily.     metoprolol succinate (TOPROL XL) 25 MG 24 hr tablet Take 1 tablet (25 mg total) by mouth at bedtime. 30 tablet 3   RYBELSUS 7 MG TABS Take 1 tablet by mouth daily.     No current facility-administered medications for this visit.    Allergies:   Dilaudid [hydromorphone hcl]   Social History:  The patient  reports that he has never smoked. He has never used smokeless tobacco. He reports current alcohol use. He reports that he does not use drugs.   Family  History:  The patient's family history includes Breast cancer in his sister; Diabetes in his maternal grandmother; Diabetes Mellitus II in his father; Stroke in his maternal grandfather.    ROS:  Please see the history of present illness.   Otherwise, review of systems is positive for none.   All other systems are reviewed and negative.    PHYSICAL EXAM: VS:  BP 128/74   Pulse 84   Resp 18   Ht 6' (1.829 m)   Wt 203 lb (92.1 kg)   SpO2 94%   BMI 27.53 kg/m  , BMI Body mass index is 27.53 kg/m. GEN: Well nourished, well developed, in no acute distress  HEENT: normal  Neck: no JVD, carotid bruits, or masses Cardiac: RRR; no murmurs, rubs, or gallops,no edema  Respiratory:  clear to auscultation bilaterally, normal work  of breathing GI: soft, nontender, nondistended, + BS MS: no deformity or atrophy  Skin: warm and dry Neuro:  Strength and sensation are intact Psych: euthymic mood, full affect  EKG:  EKG is not ordered today. Personal review of the ekg ordered 09/20/20 shows atrial flutter  Recent Labs: 09/20/2020: BUN 28; Creatinine, Ser 1.06; Hemoglobin 15.3; Magnesium 1.8; Platelets 276; Potassium 4.2; Sodium 132    Lipid Panel     Component Value Date/Time   CHOL 150 05/05/2015 0901   TRIG 106 05/05/2015 0901   TRIG 47 02/24/2014 0834   HDL 46 05/05/2015 0901   HDL 51 02/24/2014 0834   CHOLHDL 3.3 05/05/2015 0901   LDLCALC 83 05/05/2015 0901     Wt Readings from Last 3 Encounters:  11/14/20 203 lb (92.1 kg)  10/24/20 206 lb 9.6 oz (93.7 kg)  09/26/20 209 lb 3.2 oz (94.9 kg)      Other studies Reviewed: Additional studies/ records that were reviewed today include: TTE 10/18/20  Review of the above records today demonstrates:   1. Left ventricular ejection fraction, by estimation, is 60 to 65%. The  left ventricle has normal function. The left ventricle has no regional  wall motion abnormalities. Left ventricular diastolic parameters were  normal.   2. Right ventricular systolic function is normal. The right ventricular  size is normal.   3. The mitral valve is normal in structure. No evidence of mitral valve  regurgitation. No evidence of mitral stenosis.   4. The aortic valve is tricuspid. Aortic valve regurgitation is not  visualized. No aortic stenosis is present.   5. Aortic dilatation noted. There is borderline dilatation of the aortic  root, measuring 38 mm.   6. The inferior vena cava is normal in size with greater than 50%  respiratory variability, suggesting right atrial pressure of 3 mmHg.    ASSESSMENT AND PLAN:  1.  Typical atrial flutter: CHA2DS2-VASc of 3.  Currently on Eliquis 5 mg twice daily, Toprol-XL 25 mg daily.  He would like to avoid further therapy and  would prefer ablation.  Risks and benefits of been discussed.  Risk include bleeding, tamponade, heart block, stroke, among others.  He understands these risks and has agreed to the procedure.  2.  Obstructive sleep apnea: CPAP compliance encouraged  3.  Hypertension: Currently well controlled  4.  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  Kade Demicco need serial scans.  His brother has had an aortic dissection.  Current medicines are reviewed at length with the patient today.   The patient does not have concerns regarding his medicines.  The following changes were made today:  none  Labs/ tests ordered today include:  Orders Placed This Encounter  Procedures   Basic metabolic panel   CBC     Disposition:   FU with Jobin Montelongo 3 months  Signed, Laquashia Mergenthaler Meredith Leeds, MD  11/14/2020 9:36 AM     [facility] [address] [place] [address] [phone] (office) [phone] (fax)

## 2020-12-07 DIAGNOSIS — Z23 Encounter for immunization: Secondary | ICD-10-CM | POA: Diagnosis not present

## 2020-12-13 ENCOUNTER — Telehealth: Payer: Self-pay | Admitting: Cardiology

## 2020-12-13 NOTE — Telephone Encounter (Signed)
Pt c/o medication issue:  1. Name of Medication:    2. How are you currently taking this medication (dosage and times per day)?    3. Are you having a reaction (difficulty breathing--STAT)? no  4. What is your medication issue? Patient states that dr Curt Bears told him that he can put him on different medication from eliquis. Patient is asking that a prescription be put in for that. Please advise

## 2020-12-14 NOTE — Telephone Encounter (Signed)
Left message to call back  

## 2020-12-15 NOTE — Telephone Encounter (Signed)
Patient was returning a call from [place].  Advised the patient that Garrett Mcknight would not be able to speak with the patient today as it was not an urgent issue. She will contact the patient the next time she gets a chance.  He asks that we leave a detailed voicemail as his phone sends calls he does not recognize straight to voicemail

## 2020-12-15 NOTE — Telephone Encounter (Signed)
Patient says Dr. Curt Bears told him that we could switch him to a cheaper blood thinner. Informed pt that Xarelto should not be any different cost than Eliquis. Eliquis patient assistance number given to patient to discuss cost. Patient verbalized understanding and agreeable to plan.

## 2020-12-28 NOTE — Telephone Encounter (Signed)
° °  Pt is calling back and requesting t speak with Sherri regarding his xarelto. She said he is not available between 10 am - 3 pm but Sherri can call him back before 10 am or after 3 pm tomorrow

## 2020-12-30 NOTE — Telephone Encounter (Signed)
Spoke to pt yesterday, told  him I would call back and didn't.  Called pt and apologized and he was fine with this.\ Eliquis patient medication assistance number given to pt who thanks me for calling back.

## 2021-01-16 DIAGNOSIS — E119 Type 2 diabetes mellitus without complications: Secondary | ICD-10-CM | POA: Diagnosis not present

## 2021-01-16 DIAGNOSIS — I483 Typical atrial flutter: Secondary | ICD-10-CM | POA: Diagnosis not present

## 2021-01-16 DIAGNOSIS — G4733 Obstructive sleep apnea (adult) (pediatric): Secondary | ICD-10-CM | POA: Diagnosis not present

## 2021-01-16 DIAGNOSIS — E1169 Type 2 diabetes mellitus with other specified complication: Secondary | ICD-10-CM | POA: Insufficient documentation

## 2021-01-16 DIAGNOSIS — I10 Essential (primary) hypertension: Secondary | ICD-10-CM | POA: Diagnosis not present

## 2021-01-16 DIAGNOSIS — K5900 Constipation, unspecified: Secondary | ICD-10-CM | POA: Insufficient documentation

## 2021-01-16 DIAGNOSIS — E782 Mixed hyperlipidemia: Secondary | ICD-10-CM | POA: Insufficient documentation

## 2021-01-16 DIAGNOSIS — G629 Polyneuropathy, unspecified: Secondary | ICD-10-CM | POA: Insufficient documentation

## 2021-01-16 DIAGNOSIS — I471 Supraventricular tachycardia: Secondary | ICD-10-CM | POA: Diagnosis not present

## 2021-01-16 DIAGNOSIS — D6869 Other thrombophilia: Secondary | ICD-10-CM | POA: Diagnosis not present

## 2021-01-16 DIAGNOSIS — K219 Gastro-esophageal reflux disease without esophagitis: Secondary | ICD-10-CM | POA: Diagnosis not present

## 2021-01-16 HISTORY — DX: Type 2 diabetes mellitus with other specified complication: E11.69

## 2021-01-23 ENCOUNTER — Telehealth: Payer: Self-pay | Admitting: Cardiology

## 2021-01-23 NOTE — Telephone Encounter (Signed)
Patient calling to reschedule procedure 1/27.

## 2021-01-23 NOTE — Telephone Encounter (Signed)
Pt calls in to let us know that he will need to reschedule ablation., but not sure if needs rescheduling. States he has been feeling fine without issues. Discussed options, pt scheduled to see Dr. Curt Bears this Friday to further discuss treatment plan. Ablation procedure cancelled. Patient verbalized understanding and agreeable to plan.

## 2021-01-27 ENCOUNTER — Ambulatory Visit (INDEPENDENT_AMBULATORY_CARE_PROVIDER_SITE_OTHER): Payer: Medicare Other | Admitting: Cardiology

## 2021-01-27 ENCOUNTER — Other Ambulatory Visit: Payer: Self-pay

## 2021-01-27 ENCOUNTER — Other Ambulatory Visit: Payer: Medicare Other

## 2021-01-27 ENCOUNTER — Encounter: Payer: Self-pay | Admitting: Cardiology

## 2021-01-27 VITALS — BP 116/70 | HR 71 | Ht 72.0 in | Wt 205.2 lb

## 2021-01-27 DIAGNOSIS — G4733 Obstructive sleep apnea (adult) (pediatric): Secondary | ICD-10-CM | POA: Diagnosis not present

## 2021-01-27 DIAGNOSIS — I10 Essential (primary) hypertension: Secondary | ICD-10-CM | POA: Diagnosis not present

## 2021-01-27 DIAGNOSIS — I483 Typical atrial flutter: Secondary | ICD-10-CM | POA: Diagnosis not present

## 2021-01-27 NOTE — Progress Notes (Signed)
Electrophysiology Office Note   Date:  01/27/2021   ID:  Garrett Mcknight, DOB [birth year], MRN [id number]  PCP:  System, Provider Not In  Cardiologist:   Primary Electrophysiologist:  Karleen Seebeck Meredith Leeds, MD    Chief Complaint: atrial flutter   History of Present Illness: Garrett Mcknight is a 71 y.o. male who is being seen today for the evaluation of atrial flutter at the request of No ref. provider found. Presenting today for electrophysiology evaluation.  He has a history significant for diabetes, hypertension, OSA, atrial flutter.  He presented to the hospital 09/20/2020 and was diagnosed with atrial flutter at the time.  He was started on Eliquis.  He spontaneously converted to sinus rhythm in the emergency room.  Today, denies symptoms of palpitations, chest pain, shortness of breath, orthopnea, PND, lower extremity edema, claudication, dizziness, presyncope, syncope, bleeding, or neurologic sequela. The patient is tolerating medications without difficulties.  He has had a myriad of symptoms including shortness of breath and fatigue.  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  He feels well today.  He would like to reschedule his atrial flutter ablation.  Past Medical History:  Diagnosis Date   Arthritis    Diabetes ([place])    GERD (gastroesophageal reflux disease)    Hypertension    Seasonal allergies    Sleep apnea    uses CPAP   Past Surgical History:  Procedure Laterality Date   fibroma     removed from scalp 35 years ago   L5 lumbar surgery     Left bicep tendoesis     Left knee ACL repaired     Left knee replacement     Left rotary cuff repair     RHINOPHYMA RESECTION     Right knee meniscus     Right wrist reconstruction     TOTAL KNEE ARTHROPLASTY Right 06/01/2014   Procedure: RIGHT TOTAL KNEE ARTHROPLASTY;  Surgeon: Latanya Maudlin, MD;  Location: [facility];  Service: Orthopedics;  Laterality: Right;     Current Outpatient Medications  Medication Sig Dispense Refill    amitriptyline (ELAVIL) 10 MG tablet Take 10 mg by mouth at bedtime.     apixaban (ELIQUIS) 5 MG TABS tablet Take 1 tablet (5 mg total) by mouth 2 (two) times daily. 60 tablet 3   atorvastatin (LIPITOR) 10 MG tablet Take 10 mg by mouth daily.     cycloSPORINE (RESTASIS) 0.05 % ophthalmic emulsion Place 1 drop into both eyes 2 (two) times daily. 0.4 mL 11   fluticasone (FLONASE) 50 MCG/ACT nasal spray Place 1 spray into both nostrils daily.     glimepiride (AMARYL) 4 MG tablet Take 4 mg by mouth daily after breakfast.     lisinopril (ZESTRIL) 20 MG tablet Take 20 mg by mouth daily.     loratadine (CLARITIN) 10 MG tablet Take 10 mg by mouth daily.     metFORMIN (GLUCOPHAGE-XR) 500 MG 24 hr tablet Take 1,000 mg by mouth 2 (two) times daily.     metoprolol succinate (TOPROL XL) 25 MG 24 hr tablet Take 1 tablet (25 mg total) by mouth at bedtime. 30 tablet 3   RYBELSUS 7 MG TABS Take 1 tablet by mouth daily.     No current facility-administered medications for this visit.    Allergies:   Dilaudid [hydromorphone hcl]   Social History:  The patient  reports that he has never smoked. He has never used smokeless tobacco. He reports current alcohol use. He reports that he does  not use drugs.   Family History:  The patient's family history includes Breast cancer in his sister; Diabetes in his maternal grandmother; Diabetes Mellitus II in his father; Stroke in his maternal grandfather.   ROS:  Please see the history of present illness.   Otherwise, review of systems is positive for none.   All other systems are reviewed and negative.   PHYSICAL EXAM: VS:  There were no vitals taken for this visit. , BMI There is no height or weight on file to calculate BMI. GEN: Well nourished, well developed, in no acute distress  HEENT: normal  Neck: no JVD, carotid bruits, or masses Cardiac: RRR; no murmurs, rubs, or gallops,no edema  Respiratory:  clear to auscultation bilaterally, normal work of breathing GI:  soft, nontender, nondistended, + BS MS: no deformity or atrophy  Skin: warm and dry Neuro:  Strength and sensation are intact Psych: euthymic mood, full affect  EKG:  EKG is ordered today. Personal review of the ekg ordered shows sinus rhythm  Recent Labs: 09/20/2020: BUN 28; Creatinine, Ser 1.06; Hemoglobin 15.3; Magnesium 1.8; Platelets 276; Potassium 4.2; Sodium 132    Lipid Panel     Component Value Date/Time   CHOL 150 05/05/2015 0901   TRIG 106 05/05/2015 0901   TRIG 47 02/24/2014 0834   HDL 46 05/05/2015 0901   HDL 51 02/24/2014 0834   CHOLHDL 3.3 05/05/2015 0901   LDLCALC 83 05/05/2015 0901     Wt Readings from Last 3 Encounters:  11/14/20 203 lb (92.1 kg)  10/24/20 206 lb 9.6 oz (93.7 kg)  09/26/20 209 lb 3.2 oz (94.9 kg)      Other studies Reviewed: Additional studies/ records that were reviewed today include: TTE 10/18/20  Review of the above records today demonstrates:   1. Left ventricular ejection fraction, by estimation, is 60 to 65%. The  left ventricle has normal function. The left ventricle has no regional  wall motion abnormalities. Left ventricular diastolic parameters were  normal.   2. Right ventricular systolic function is normal. The right ventricular  size is normal.   3. The mitral valve is normal in structure. No evidence of mitral valve  regurgitation. No evidence of mitral stenosis.   4. The aortic valve is tricuspid. Aortic valve regurgitation is not  visualized. No aortic stenosis is present.   5. Aortic dilatation noted. There is borderline dilatation of the aortic  root, measuring 38 mm.   6. The inferior vena cava is normal in size with greater than 50%  respiratory variability, suggesting right atrial pressure of 3 mmHg.    ASSESSMENT AND PLAN:  1.  Typical atrial flutter: CHA2DS2-VASc of 3.  Currently on Eliquis 5 mg twice daily, Toprol-XL 25 mg daily.  Currently feeling well.  No chest pain or shortness of breath.  He has not  had any further episodes of atrial flutter like to get off his Eliquis.  We Garrett Mcknight plan for ablation.  Also benefits were discussed.  Recently bleeding, tamponade, heart block, stroke, among others.  He understands his risks and has agreed to the procedure.  2.  Obstructive sleep apnea: CPAP compliance encouraged  3.  Hypertension: Currently well controlled  4.  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  Garrett Mcknight need serial scans.  Brother had aortic dissection.  Plan per primary cardiology.   Current medicines are reviewed at length with the patient today.   The patient does not have concerns regarding his medicines.  The following  changes were made today: None  Labs/ tests ordered today include:  No orders of the defined types were placed in this encounter.    Disposition:   FU with Carle Fenech 3 months  Signed, Arleatha Philipps Meredith Leeds, MD  01/27/2021 8:09 AM     [facility] [address] [place] [address] [phone] (office) [phone] (fax)

## 2021-01-27 NOTE — Patient Instructions (Signed)
Medication Instructions:  Your physician recommends that you continue on your current medications as directed. Please refer to the Current Medication list given to you today.  *If you need a refill on your cardiac medications before your next appointment, please call your pharmacy*   Lab Work: None ordered If you have labs (blood work) drawn today and your tests are completely normal, you will receive your results only by: [place] (if you have MyChart) OR A paper copy in the mail If you have any lab test that is abnormal or we need to change your treatment, we will call you to review the results.   Testing/Procedures: None ordered   Follow-Up: At [facility], you and your health needs are our priority.  As part of our continuing mission to provide you with exceptional heart care, we have created designated Provider Care Teams.  These Care Teams include your primary Cardiologist (physician) and Advanced Practice Providers (APPs -  Physician Assistants and Nurse Practitioners) who all work together to provide you with the care you need, when you need it.  We recommend signing up for the patient portal called "MyChart".  Sign up information is provided on this After Visit Summary.  MyChart is used to connect with patients for Virtual Visits (Telemedicine).  Patients are able to view lab/test results, encounter notes, upcoming appointments, etc.  Non-urgent messages can be sent to your provider as well.   To learn more about what you can do with MyChart, go to [email or web address].    Your next appointment:   To   be determined after rescheduling your ablation procedure.  The format for your next appointment:   In Person  Provider:   Allegra Lai, MD    Thank you for choosing [facility]!!   Trinidad Curet, RN [phone]   Other Instructions  Dates for ablation:  3/3, 3/8, 3/10, 3/15, 3/29, 3/31, 4/4, 4/7, 4/12, 4/13, 4/19, 4/21

## 2021-02-03 ENCOUNTER — Ambulatory Visit (HOSPITAL_COMMUNITY): Admit: 2021-02-03 | Payer: Medicare Other | Admitting: Cardiology

## 2021-02-03 ENCOUNTER — Encounter (HOSPITAL_COMMUNITY): Payer: Self-pay

## 2021-02-03 SURGERY — A-FLUTTER ABLATION
Anesthesia: General

## 2021-02-28 DIAGNOSIS — E782 Mixed hyperlipidemia: Secondary | ICD-10-CM | POA: Diagnosis not present

## 2021-02-28 DIAGNOSIS — L309 Dermatitis, unspecified: Secondary | ICD-10-CM | POA: Diagnosis not present

## 2021-02-28 DIAGNOSIS — I483 Typical atrial flutter: Secondary | ICD-10-CM | POA: Diagnosis not present

## 2021-02-28 DIAGNOSIS — G4733 Obstructive sleep apnea (adult) (pediatric): Secondary | ICD-10-CM | POA: Diagnosis not present

## 2021-02-28 DIAGNOSIS — Z1211 Encounter for screening for malignant neoplasm of colon: Secondary | ICD-10-CM | POA: Diagnosis not present

## 2021-02-28 DIAGNOSIS — R42 Dizziness and giddiness: Secondary | ICD-10-CM | POA: Diagnosis not present

## 2021-02-28 DIAGNOSIS — Z0000 Encounter for general adult medical examination without abnormal findings: Secondary | ICD-10-CM | POA: Diagnosis not present

## 2021-02-28 DIAGNOSIS — E1165 Type 2 diabetes mellitus with hyperglycemia: Secondary | ICD-10-CM | POA: Diagnosis not present

## 2021-02-28 DIAGNOSIS — I10 Essential (primary) hypertension: Secondary | ICD-10-CM | POA: Diagnosis not present

## 2021-02-28 DIAGNOSIS — E041 Nontoxic single thyroid nodule: Secondary | ICD-10-CM | POA: Diagnosis not present

## 2021-02-28 DIAGNOSIS — R490 Dysphonia: Secondary | ICD-10-CM | POA: Diagnosis not present

## 2021-03-24 ENCOUNTER — Telehealth: Payer: Self-pay | Admitting: Cardiology

## 2021-03-24 NOTE — Telephone Encounter (Signed)
Patient called to schedule his Ablation  ?

## 2021-03-27 ENCOUNTER — Telehealth: Payer: Self-pay

## 2021-03-27 DIAGNOSIS — E1169 Type 2 diabetes mellitus with other specified complication: Secondary | ICD-10-CM | POA: Insufficient documentation

## 2021-03-27 DIAGNOSIS — E119 Type 2 diabetes mellitus without complications: Secondary | ICD-10-CM | POA: Insufficient documentation

## 2021-03-27 NOTE — Telephone Encounter (Signed)
? ?  Pre-operative Risk Assessment  ?  ?Patient Name: Garrett Mcknight  ?DOB: Oct 27, 1950 ?MRN: [id number]  ? ?  ? ?Request for Surgical Clearance   ? ?Procedure:   Colonoscopy ? ?Date of Surgery:  Clearance 04/10/21                              ?   ?Surgeon:  Dr. Reather Laurence ?Surgeon's Group or Practice Name:  [facility] ?Phone number:  [phone] ?Fax number:  [phone] ?  ?Type of Clearance Requested:   ?- Medical  ?- Pharmacy:  Hold Apixaban (Eliquis) x 2 days ?  ?Type of Anesthesia:  Not Indicated ?  ?Additional requests/questions:   None ? ?Signed, ?Masiyah Jorstad   ?03/27/2021, 1:32 PM  ? ?

## 2021-03-27 NOTE — Telephone Encounter (Signed)
Patient with diagnosis of A Flutter on Eliquis for anticoagulation.   ? ?Procedure: colonoscopy ?Date of procedure: 04/10/21 ? ? ?CHA2DS2-VASc Score = 3  ?This indicates a 3.2% annual risk of stroke. ?The patient's score is based upon: ?CHF History: 0 ?HTN History: 1 ?Diabetes History: 1 ?Stroke History: 0 ?Vascular Disease History: 0 ?Age Score: 1 ?Gender Score: 0 ?  ?CrCl 83 mL/min ?Platelet count 276K ? ? ?Per office protocol, patient can hold Eliquis for 2 days prior to procedure.    ?

## 2021-03-27 NOTE — Telephone Encounter (Signed)
? ? ?  Name: Garrett Mcknight  ?DOB: 07-13-1950  ?MRN: [id number] ? ?Primary Cardiologist: Will Meredith Leeds, MD ? ? ?Preoperative team, please contact this patient and set up a phone call appointment for further preoperative risk assessment. Please obtain consent and complete medication review. Thank you for your help. ? ? ?Ledora Bottcher, PA-C ?03/27/2021, 4:53 PM ?[phone] ?[facility] ?[address] ?[place], [place] [address] ? ? ?

## 2021-03-28 NOTE — Telephone Encounter (Signed)
I called pt to schedule him for pre-op clearance. He would like for you to call him back to get his Ablation scheduled.  ?

## 2021-03-28 NOTE — Telephone Encounter (Signed)
Pt would like to schedule AFLutter ablation on 5/5. ?Aware I will be in touch with details. ?

## 2021-03-28 NOTE — Telephone Encounter (Signed)
?  Patient Consent for Virtual Visit  ? ? ?   ? ?Garrett Mcknight has provided verbal consent on 03/28/2021 for a virtual visit (video or telephone). ? ? ?CONSENT FOR VIRTUAL VISIT FOR:  Garrett Mcknight  ?By participating in this virtual visit I agree to the following: ? ?I hereby voluntarily request, consent and authorize [facility] and its employed or contracted physicians, physician assistants, nurse practitioners or other licensed health care professionals (the Practitioner), to provide me with telemedicine health care services (the ?Services") as deemed necessary by the treating Practitioner. I acknowledge and consent to receive the Services by the Practitioner via telemedicine. I understand that the telemedicine visit will involve communicating with the Practitioner through live audiovisual communication technology and the disclosure of certain medical information by electronic transmission. I acknowledge that I have been given the opportunity to request an in-person assessment or other available alternative prior to the telemedicine visit and am voluntarily participating in the telemedicine visit. ? ?I understand that I have the right to withhold or withdraw my consent to the use of telemedicine in the course of my care at any time, without affecting my right to future care or treatment, and that the Practitioner or I may terminate the telemedicine visit at any time. I understand that I have the right to inspect all information obtained and/or recorded in the course of the telemedicine visit and may receive copies of available information for a reasonable fee.  I understand that some of the potential risks of receiving the Services via telemedicine include:  ?Delay or interruption in medical evaluation due to technological equipment failure or disruption; ?Information transmitted may not be sufficient (e.g. poor resolution of images) to allow for appropriate medical decision making by the Practitioner;  and/or  ?In rare instances, security protocols could fail, causing a breach of personal health information. ? ?Furthermore, I acknowledge that it is my responsibility to provide information about my medical history, conditions and care that is complete and accurate to the best of my ability. I acknowledge that Practitioner's advice, recommendations, and/or decision may be based on factors not within their control, such as incomplete or inaccurate data provided by me or distortions of diagnostic images or specimens that may result from electronic transmissions. I understand that the practice of medicine is not an exact science and that Practitioner makes no warranties or guarantees regarding treatment outcomes. I acknowledge that a copy of this consent can be made available to me via my patient portal ([place]), or I can request a printed copy by calling the office of [facility].   ? ?I understand that my insurance will be billed for this visit.  ? ?I have read or had this consent read to me. ?I understand the contents of this consent, which adequately explains the benefits and risks of the Services being provided via telemedicine.  ?I have been provided ample opportunity to ask questions regarding this consent and the Services and have had my questions answered to my satisfaction. ?I give my informed consent for the services to be provided through the use of telemedicine in my medical care ? ? ? ?

## 2021-03-28 NOTE — Telephone Encounter (Signed)
Pt is scheduled for a Tele-visit with our Pre-op team on 03/29/2021 for surgical clearance.  ?

## 2021-03-29 ENCOUNTER — Other Ambulatory Visit: Payer: Self-pay

## 2021-03-29 ENCOUNTER — Ambulatory Visit (INDEPENDENT_AMBULATORY_CARE_PROVIDER_SITE_OTHER): Payer: Medicare Other | Admitting: General Practice

## 2021-03-29 DIAGNOSIS — Z01810 Encounter for preprocedural cardiovascular examination: Secondary | ICD-10-CM

## 2021-03-29 NOTE — Progress Notes (Signed)
? ?Virtual Visit via Telephone Note  ? ?This visit type was conducted due to national recommendations for restrictions regarding the COVID-19 Pandemic (e.g. social distancing) in an effort to limit this patient's exposure and mitigate transmission in our community.  Due to his co-morbid illnesses, this patient is at least at moderate risk for complications without adequate follow up.  This format is felt to be most appropriate for this patient at this time.  The patient did not have access to video technology/had technical difficulties with video requiring transitioning to audio format only (telephone).  All issues noted in this document were discussed and addressed.  No physical exam could be performed with this format.  Please refer to the patient's chart for his  consent to telehealth for [facility]. ?Evaluation Performed:  Preoperative cardiovascular risk assessment ? ?This visit type was conducted due to national recommendations for restrictions regarding the COVID-19 Pandemic (e.g. social distancing).  This format is felt to be most appropriate for this patient at this time.  All issues noted in this document were discussed and addressed.  No physical exam was performed (except for noted visual exam findings with Video Visits).  Please refer to the patient's chart (MyChart message for video visits and phone note for telephone visits) for the patient's consent to telehealth for [facility]. ?_____________  ? ?Date:  03/29/2021  ? ?Patient ID:  Garrett Mcknight, DOB Aug 09, 1950, MRN [id number] ?Patient Location:  ?Home ?Provider location:   ?Office ? ?Primary Care Provider:  System, Provider Not In ?Primary Cardiologist:  Will Meredith Leeds, MD ? ?Chief Complaint  ?  ?71 y.o. y/o male with a h/o atrial flutter, obstructive sleep apnea, hypertension, who is pending colonoscopy, and presents today for telephonic preoperative cardiovascular risk assessment. ? ?Past Medical History  ?  ?Past Medical History:   ?Diagnosis Date  ? Arthritis   ? Diabetes ([place])   ? GERD (gastroesophageal reflux disease)   ? Hypertension   ? Seasonal allergies   ? Sleep apnea   ? uses CPAP  ? ?Past Surgical History:  ?Procedure Laterality Date  ? fibroma    ? removed from scalp 35 years ago  ? L5 lumbar surgery    ? Left bicep tendoesis    ? Left knee ACL repaired    ? Left knee replacement    ? Left rotary cuff repair    ? RHINOPHYMA RESECTION    ? Right knee meniscus    ? Right wrist reconstruction    ? TOTAL KNEE ARTHROPLASTY Right 06/01/2014  ? Procedure: RIGHT TOTAL KNEE ARTHROPLASTY;  Surgeon: Latanya Maudlin, MD;  Location: [facility];  Service: Orthopedics;  Laterality: Right;  ? ? ?Allergies ? ?Allergies  ?Allergen Reactions  ? Dilaudid [Hydromorphone Hcl]   ?  Dry heaves-with IV form  ? ? ?History of Present Illness  ?  ?Garrett Mcknight is a 71 y.o. male who presents via audio/video conferencing for a telehealth visit today.  Pt was last seen in cardiology clinic on 01/27/21, by Dr. Curt Bears.  At that time Garrett Mcknight was doing well .  he is now pending colonoscopy.  Since his last visit, he continues to be stable from a cardiac standpoint. ? ?Today he denies chest pain, shortness of breath, lower extremity edema, fatigue, melena, hematuria, hemoptysis, diaphoresis, weakness, presyncope, syncope, orthopnea, and PND. ? ? ? ?Home Medications  ?  ?Prior to Admission medications   ?Medication Sig Start Date End Date Taking? Authorizing Provider  ?amitriptyline (ELAVIL) 10 MG  tablet Take 10 mg by mouth at bedtime.    [provider]  ?apixaban (ELIQUIS) 5 MG TABS tablet Take 1 tablet (5 mg total) by mouth 2 (two) times daily. 09/26/20 03/28/21  [name], [name], PA  ?atorvastatin (LIPITOR) 10 MG tablet Take 10 mg by mouth daily.    [provider]  ?fluticasone (FLONASE) 50 MCG/ACT nasal spray Place 1 spray into both nostrils daily.    [provider]  ?glimepiride (AMARYL) 4 MG tablet Take 4 mg by mouth daily after  breakfast. 05/01/18   [provider]  ?lisinopril (ZESTRIL) 20 MG tablet Take 20 mg by mouth daily. 05/01/18   [provider]  ?loratadine (CLARITIN) 10 MG tablet Take 10 mg by mouth daily.    [provider]  ?metFORMIN (GLUCOPHAGE-XR) 500 MG 24 hr tablet Take 1,000 mg by mouth 2 (two) times daily. 07/27/20   [provider]  ?metoprolol succinate (TOPROL XL) 25 MG 24 hr tablet Take 1 tablet (25 mg total) by mouth at bedtime. 09/26/20 09/26/21  [name], Berton Mount R, PA  ?RYBELSUS 7 MG TABS Take 1 tablet by mouth daily. 10/16/20   [provider]  ? ? ?Physical Exam  ?  ?Vital Signs:  Garrett Mcknight does not have vital signs available for review today. ? ?Given telephonic nature of communication, physical exam is limited. ?AAOx3. NAD. Normal affect.  Speech and respirations are unlabored. ? ?Accessory Clinical Findings  ?  ?None ? ?Assessment & Plan  ?  ?1.  Preoperative Cardiovascular Risk Assessment: ? ?Primary Cardiologist: Will Meredith Leeds, MD ? ?Chart reviewed as part of pre-operative protocol coverage. Given past medical history and time since last visit, based on ACC/AHA guidelines, Garrett Mcknight would be at acceptable risk for the planned procedure without further cardiovascular testing.  ? ?Patient was advised that if he develops new symptoms prior to surgery to contact our office to arrange a follow-up appointment.  He verbalized understanding. ? ? ?Patient with diagnosis of A Flutter on Eliquis for anticoagulation.   ?  ?Procedure: colonoscopy ?Date of procedure: 04/10/21 ?  ?  ?CHA2DS2-VASc Score = 3  ?This indicates a 3.2% annual risk of stroke. ?The patient's score is based upon: ?CHF History: 0 ?HTN History: 1 ?Diabetes History: 1 ?Stroke History: 0 ?Vascular Disease History: 0 ?Age Score: 1 ?Gender Score: 0 ?  ?CrCl 83 mL/min ?Platelet count 276K ?  ?  ?Per office protocol, patient can hold Eliquis for 2 days prior to procedure. ? ? ?COVID-19 Education: ?The  signs and symptoms of COVID-19 were discussed with the patient and how to seek care for testing (follow up with PCP or arrange E-visit).  The importance of social distancing was discussed today. ? ?Patient Risk:   ?After full review of this patient's history and clinical status, I feel that he is at least moderate risk for cardiac complications at this time, thus necessitating a telehealth visit sooner than our first available in office visit. ? ?Time:   ?Today, I have spent 6 minutes with the patient with telehealth technology discussing medical history, symptoms, and management plan.  I spent greater than 10 minutes reviewing the patient's past medical history, medications, and cardiac history prior to this call. ? ? ?Deberah Pelton, NP ? ?03/29/2021, 8:15 AM ? ?

## 2021-03-29 NOTE — Progress Notes (Signed)
CLEARANCE NOTE AND TELE OV NOTE HAVE BEEN FAXED TO REQUESTING OFFICE TODAY ?

## 2021-03-30 ENCOUNTER — Telehealth: Payer: Self-pay

## 2021-03-30 NOTE — Telephone Encounter (Signed)
Left message requesting Pt return call to this nurse to go over aflutter ablation details. ? ?

## 2021-03-30 NOTE — Telephone Encounter (Signed)
Stanton Kidney, RN at 03/28/2021  6:02 PM ? ?Status: Signed  ?Pt would like to schedule AFLutter ablation on 5/5. ?Aware I will be in touch with details.  ?  ? ?

## 2021-03-31 DIAGNOSIS — H2511 Age-related nuclear cataract, right eye: Secondary | ICD-10-CM | POA: Diagnosis not present

## 2021-03-31 DIAGNOSIS — H2513 Age-related nuclear cataract, bilateral: Secondary | ICD-10-CM | POA: Diagnosis not present

## 2021-03-31 DIAGNOSIS — H25013 Cortical age-related cataract, bilateral: Secondary | ICD-10-CM | POA: Diagnosis not present

## 2021-03-31 DIAGNOSIS — E119 Type 2 diabetes mellitus without complications: Secondary | ICD-10-CM | POA: Diagnosis not present

## 2021-03-31 DIAGNOSIS — H25043 Posterior subcapsular polar age-related cataract, bilateral: Secondary | ICD-10-CM | POA: Diagnosis not present

## 2021-03-31 NOTE — Telephone Encounter (Signed)
Attempted to call Pt.  Left message. ? ?Pt's aflutter ablation for 05/12/21 has been scheduled. ? ?Only remaining work up items are: ? ? Schedule labs ?Update instruction letter ?Advise Pt ?

## 2021-04-04 NOTE — Telephone Encounter (Signed)
?  Pt is returning, pt said he can't answer call between 10 am to 3 pm during the week  ?

## 2021-04-10 DIAGNOSIS — D125 Benign neoplasm of sigmoid colon: Secondary | ICD-10-CM | POA: Diagnosis not present

## 2021-04-10 DIAGNOSIS — Z1211 Encounter for screening for malignant neoplasm of colon: Secondary | ICD-10-CM | POA: Diagnosis not present

## 2021-04-10 DIAGNOSIS — K648 Other hemorrhoids: Secondary | ICD-10-CM | POA: Diagnosis not present

## 2021-04-10 DIAGNOSIS — D124 Benign neoplasm of descending colon: Secondary | ICD-10-CM | POA: Diagnosis not present

## 2021-04-14 NOTE — Telephone Encounter (Signed)
Pt reports more frequent elevated HRs in the 90s, and he is not getting good sleep. ?Aware I will send to Dr Curt Bears to see about increasing Toprol to 50 mg  to see if this helps HRs. ?(Aflutter ablation scheduled for 5/5) ? ?Pt also reports that he had a colonoscopy on Monday and possibly having to do another soon.  He may have some issues going on according to him. ?Pt advised to discuss result w/ GI and what plan is.  He will call next week and let us know. ?Otherwise I will follow back up when I return to office week after next to determine if can proceed with ablation or need to move date d/t GI issues. ?Patient verbalized understanding and agreeable to plan.  ? ? ?

## 2021-04-19 MED ORDER — METOPROLOL SUCCINATE ER 50 MG PO TB24: 50.0000 mg | ORAL_TABLET | Freq: Every day | ORAL | 3 refills | Status: DC

## 2021-04-19 NOTE — Telephone Encounter (Signed)
Spoke with the patient and advised him that he can increase Toprol to 50 mg daily.  ?He also reports that everything from GI came back good and they have cleared him for 3 years.  ?

## 2021-04-28 ENCOUNTER — Encounter: Payer: Self-pay | Admitting: *Deleted

## 2021-04-28 ENCOUNTER — Telehealth: Payer: Self-pay | Admitting: *Deleted

## 2021-04-28 DIAGNOSIS — I483 Typical atrial flutter: Secondary | ICD-10-CM

## 2021-04-28 DIAGNOSIS — Z01812 Encounter for preprocedural laboratory examination: Secondary | ICD-10-CM

## 2021-04-28 NOTE — Telephone Encounter (Signed)
Reviewed procedure instructions w/ pt (aflutter ablation 5/5) ?He will stop by the office 4/28 for blood work and pick up letter of instructions. ?Patient verbalized understanding and agreeable to plan.  ? ?

## 2021-05-05 ENCOUNTER — Other Ambulatory Visit: Payer: Medicare Other | Admitting: *Deleted

## 2021-05-05 DIAGNOSIS — I483 Typical atrial flutter: Secondary | ICD-10-CM

## 2021-05-05 DIAGNOSIS — Z01812 Encounter for preprocedural laboratory examination: Secondary | ICD-10-CM

## 2021-05-05 LAB — BASIC METABOLIC PANEL
BUN/Creatinine Ratio: 20 (ref 10–24)
BUN: 17 mg/dL (ref 8–27)
CO2: 24 mmol/L (ref 20–29)
Calcium: 9.5 mg/dL (ref 8.6–10.2)
Chloride: 101 mmol/L (ref 96–106)
Creatinine, Ser: 0.83 mg/dL (ref 0.76–1.27)
Glucose: 276 mg/dL — ABNORMAL HIGH (ref 70–99)
Potassium: 4.2 mmol/L (ref 3.5–5.2)
Sodium: 139 mmol/L (ref 134–144)
eGFR: 93 mL/min/{1.73_m2} (ref >59)

## 2021-05-05 LAB — CBC
Hematocrit: 42.5 % (ref 37.5–51.0)
Hemoglobin: 14.2 g/dL (ref 13.0–17.7)
MCH: 28.2 pg (ref 26.6–33.0)
MCHC: 33.4 g/dL (ref 31.5–35.7)
MCV: 84 fL (ref 79–97)
Platelets: 251 10*3/uL (ref 150–450)
RBC: 5.04 x10E6/uL (ref 4.14–5.80)
RDW: 12.5 % (ref 11.6–15.4)
WBC: 8 10*3/uL (ref 3.4–10.8)

## 2021-05-11 NOTE — Pre-Procedure Instructions (Signed)
Attempted to call patient regarding procedure instructions.  Left voice mail on the following items: ?Arrival time 1130 ?Nothing to eat or drink after midnight ?No meds AM of procedure ?Responsible person to drive you home and stay with you for 24 hrs ? ?Have you missed any doses of anti-coagulant Take Eliquis doses today, none in the morning. ?   ?

## 2021-05-12 ENCOUNTER — Ambulatory Visit (HOSPITAL_BASED_OUTPATIENT_CLINIC_OR_DEPARTMENT_OTHER): Payer: Medicare Other | Admitting: Anesthesiology

## 2021-05-12 ENCOUNTER — Ambulatory Visit (HOSPITAL_COMMUNITY)
Admission: RE | Admit: 2021-05-12 | Discharge: 2021-05-12 | Disposition: A | Discharge status: Home / Self Care | Payer: Medicare Other | Attending: Cardiology | Admitting: Cardiology

## 2021-05-12 ENCOUNTER — Encounter (HOSPITAL_COMMUNITY): Payer: Self-pay | Admitting: Cardiology

## 2021-05-12 ENCOUNTER — Other Ambulatory Visit: Payer: Self-pay

## 2021-05-12 ENCOUNTER — Ambulatory Visit (HOSPITAL_COMMUNITY): Payer: Medicare Other | Admitting: Anesthesiology

## 2021-05-12 ENCOUNTER — Encounter (HOSPITAL_COMMUNITY)
Admission: RE | Disposition: A | Discharge status: Home / Self Care | Payer: Self-pay | Source: Home / Self Care | Attending: Cardiology

## 2021-05-12 DIAGNOSIS — I10 Essential (primary) hypertension: Secondary | ICD-10-CM | POA: Diagnosis not present

## 2021-05-12 DIAGNOSIS — Z7901 Long term (current) use of anticoagulants: Secondary | ICD-10-CM | POA: Insufficient documentation

## 2021-05-12 DIAGNOSIS — Z9989 Dependence on other enabling machines and devices: Secondary | ICD-10-CM | POA: Diagnosis not present

## 2021-05-12 DIAGNOSIS — G4733 Obstructive sleep apnea (adult) (pediatric): Secondary | ICD-10-CM | POA: Insufficient documentation

## 2021-05-12 DIAGNOSIS — I4892 Unspecified atrial flutter: Secondary | ICD-10-CM | POA: Diagnosis not present

## 2021-05-12 DIAGNOSIS — E119 Type 2 diabetes mellitus without complications: Secondary | ICD-10-CM | POA: Diagnosis not present

## 2021-05-12 DIAGNOSIS — I4891 Unspecified atrial fibrillation: Secondary | ICD-10-CM | POA: Diagnosis not present

## 2021-05-12 DIAGNOSIS — I483 Typical atrial flutter: Secondary | ICD-10-CM | POA: Diagnosis not present

## 2021-05-12 HISTORY — PX: A-FLUTTER ABLATION: EP1230

## 2021-05-12 LAB — GLUCOSE, CAPILLARY
Glucose-Capillary: 179 mg/dL — ABNORMAL HIGH (ref 70–99)
Glucose-Capillary: 187 mg/dL — ABNORMAL HIGH (ref 70–99)

## 2021-05-12 SURGERY — A-FLUTTER ABLATION
Anesthesia: General

## 2021-05-12 MED ORDER — SUGAMMADEX SODIUM 200 MG/2ML IV SOLN
INTRAVENOUS | Status: DC | PRN
  Administered 2021-05-12: 200 mg via INTRAVENOUS

## 2021-05-12 MED ORDER — MIDAZOLAM HCL 2 MG/2ML IJ SOLN
INTRAMUSCULAR | Status: DC | PRN
  Administered 2021-05-12: 2 mg via INTRAVENOUS

## 2021-05-12 MED ORDER — ACETAMINOPHEN 325 MG PO TABS
650.0000 mg | ORAL_TABLET | ORAL | Status: DC | PRN
  Filled 2021-05-12: qty 2

## 2021-05-12 MED ORDER — LIDOCAINE 2% (20 MG/ML) 5 ML SYRINGE
INTRAMUSCULAR | Status: DC | PRN
  Administered 2021-05-12: 60 mg via INTRAVENOUS

## 2021-05-12 MED ORDER — PROPOFOL 10 MG/ML IV BOLUS
INTRAVENOUS | Status: DC | PRN
  Administered 2021-05-12: 150 mg via INTRAVENOUS

## 2021-05-12 MED ORDER — SODIUM CHLORIDE 0.9% FLUSH: 3.0000 mL | Freq: Two times a day (BID) | INTRAVENOUS | Status: DC

## 2021-05-12 MED ORDER — HEPARIN (PORCINE) IN NACL 1000-0.9 UT/500ML-% IV SOLN
INTRAVENOUS | Status: AC
  Filled 2021-05-12: qty 500

## 2021-05-12 MED ORDER — SODIUM CHLORIDE 0.9% FLUSH: 3.0000 mL | INTRAVENOUS | Status: DC | PRN

## 2021-05-12 MED ORDER — PHENYLEPHRINE 80 MCG/ML (10ML) SYRINGE FOR IV PUSH (FOR BLOOD PRESSURE SUPPORT)
PREFILLED_SYRINGE | INTRAVENOUS | Status: DC | PRN
  Administered 2021-05-12 (×2): 80 ug via INTRAVENOUS

## 2021-05-12 MED ORDER — HEPARIN (PORCINE) IN NACL 1000-0.9 UT/500ML-% IV SOLN
INTRAVENOUS | Status: DC | PRN
  Administered 2021-05-12 (×2): 500 mL

## 2021-05-12 MED ORDER — ONDANSETRON HCL 4 MG/2ML IJ SOLN
INTRAMUSCULAR | Status: DC | PRN
  Administered 2021-05-12: 4 mg via INTRAVENOUS

## 2021-05-12 MED ORDER — ONDANSETRON HCL 4 MG/2ML IJ SOLN: 4.0000 mg | Freq: Four times a day (QID) | INTRAMUSCULAR | Status: DC | PRN

## 2021-05-12 MED ORDER — ACETAMINOPHEN 500 MG PO TABS
ORAL_TABLET | ORAL | Status: AC
  Filled 2021-05-12: qty 2

## 2021-05-12 MED ORDER — DEXAMETHASONE SODIUM PHOSPHATE 10 MG/ML IJ SOLN
INTRAMUSCULAR | Status: DC | PRN
  Administered 2021-05-12: 5 mg via INTRAVENOUS

## 2021-05-12 MED ORDER — ACETAMINOPHEN 500 MG PO TABS
1000.0000 mg | ORAL_TABLET | Freq: Once | ORAL | Status: AC
  Administered 2021-05-12: 1000 mg via ORAL

## 2021-05-12 MED ORDER — SODIUM CHLORIDE 0.9 % IV SOLN: INTRAVENOUS | Status: DC

## 2021-05-12 MED ORDER — FENTANYL CITRATE (PF) 100 MCG/2ML IJ SOLN
INTRAMUSCULAR | Status: DC | PRN
  Administered 2021-05-12 (×2): 50 ug via INTRAVENOUS

## 2021-05-12 MED ORDER — SODIUM CHLORIDE 0.9 % IV SOLN: 250.0000 mL | INTRAVENOUS | Status: DC | PRN

## 2021-05-12 MED ORDER — ROCURONIUM BROMIDE 10 MG/ML (PF) SYRINGE
PREFILLED_SYRINGE | INTRAVENOUS | Status: DC | PRN
  Administered 2021-05-12: 70 mg via INTRAVENOUS

## 2021-05-12 SURGICAL SUPPLY — 13 items
BAG SNAP BAND KOVER 36X36 (MISCELLANEOUS) ×1 IMPLANT
CATH EZ STEER NAV 8MM F-J CUR (ABLATOR) ×1 IMPLANT
CATH WEBSTER BI DIR CS D-F CRV (CATHETERS) ×1 IMPLANT
CLOSURE PERCLOSE PROSTYLE (VASCULAR PRODUCTS) ×5 IMPLANT
MAT PREVALON FULL STRYKER (MISCELLANEOUS) ×1 IMPLANT
PACK EP LATEX FREE (CUSTOM PROCEDURE TRAY) ×2
PACK EP LF (CUSTOM PROCEDURE TRAY) ×1 IMPLANT
PAD DEFIB RADIO PHYSIO CONN (PAD) ×2 IMPLANT
PATCH CARTO3 (PAD) ×1 IMPLANT
SHEATH PINNACLE 7F 10CM (SHEATH) ×1 IMPLANT
SHEATH PINNACLE 8F 10CM (SHEATH) ×1 IMPLANT
SHEATH PROBE COVER 6X72 (BAG) ×1 IMPLANT
SHEATH SWARTZ RAMP 8.5F 60CM (SHEATH) ×1 IMPLANT

## 2021-05-12 NOTE — Progress Notes (Signed)
Ambulated patient at 1800, no bleeding or hematoma observed. Patient getting ready for discharge, per MD. ?

## 2021-05-12 NOTE — Anesthesia Procedure Notes (Signed)
Procedure Name: Intubation ?Date/Time: 05/12/2021 12:58 PM ?Performed by: Moshe Salisbury, CRNA ?Pre-anesthesia Checklist: Patient identified, Emergency Drugs available, Suction available and Patient being monitored ?Patient Re-evaluated:Patient Re-evaluated prior to induction ?Oxygen Delivery Method: Circle System Utilized ?Preoxygenation: Pre-oxygenation with 100% oxygen ?Induction Type: IV induction ?Ventilation: Mask ventilation without difficulty and Oral airway inserted - appropriate to patient size ?Laryngoscope Size: Mac and 4 ?Grade View: Grade II ?Tube type: Oral ?Tube size: 8.0 mm ?Number of attempts: 1 ?Airway Equipment and Method: Stylet and Oral airway ?Placement Confirmation: ETT inserted through vocal cords under direct vision, positive ETCO2 and breath sounds checked- equal and bilateral ?Secured at: 22 cm ?Tube secured with: Tape ?Dental Injury: Teeth and Oropharynx as per pre-operative assessment  ?Difficulty Due To: Difficult Airway- due to anterior larynx ?Comments: Attempted ETT placement by Junie Bame CRNA with grade 2b view. Placement of ETT by Dr. Elgie Congo with grade 2b view, anterior airway ? ? ? ? ?

## 2021-05-12 NOTE — Discharge Instructions (Signed)

## 2021-05-12 NOTE — H&P (Signed)
? ?Electrophysiology Office Note ? ? ?Date:  05/12/2021  ? ?ID:  Garrett Mcknight, DOB 1950-12-12, MRN [id number] ? ?PCP:  System, Provider Not In  ?Cardiologist:   ?Primary Electrophysiologist:  Garrett Thielke Meredith Leeds, MD   ? ?Chief Complaint: atrial flutter ?  ?History of Present Illness: ?Garrett Mcknight is a 71 y.o. male who is being seen today for the evaluation of atrial flutter at the request of No ref. provider found. Presenting today for electrophysiology evaluation. ? ?He has a history significant for diabetes, hypertension, OSA, atrial flutter.  He presented to the hospital 09/20/2020 and was diagnosed with atrial flutter at the time.  He was started on Eliquis.  He spontaneously converted to sinus rhythm in the emergency room. ? ?Today, denies symptoms of palpitations, chest pain, shortness of breath, orthopnea, PND, lower extremity edema, claudication, dizziness, presyncope, syncope, bleeding, or neurologic sequela. The patient is tolerating medications without difficulties. Plan ablation today.  ? ?Past Medical History:  ?Diagnosis Date  ? Arthritis   ? Diabetes ([place])   ? GERD (gastroesophageal reflux disease)   ? Hypertension   ? Seasonal allergies   ? Sleep apnea   ? uses CPAP  ? ?Past Surgical History:  ?Procedure Laterality Date  ? fibroma    ? removed from scalp 35 years ago  ? L5 lumbar surgery    ? Left bicep tendoesis    ? Left knee ACL repaired    ? Left knee replacement    ? Left rotary cuff repair    ? RHINOPHYMA RESECTION    ? Right knee meniscus    ? Right wrist reconstruction    ? TOTAL KNEE ARTHROPLASTY Right 06/01/2014  ? Procedure: RIGHT TOTAL KNEE ARTHROPLASTY;  Surgeon: Latanya Maudlin, MD;  Location: [facility];  Service: Orthopedics;  Laterality: Right;  ? ? ? ?No current facility-administered medications for this encounter.  ? ? ?Allergies:   Dilaudid [hydromorphone hcl]  ? ?Social History:  The patient  reports that he has never smoked. He has never used smokeless tobacco. He reports current  alcohol use. He reports that he does not use drugs.  ? ?Family History:  The patient's family history includes Breast cancer in his sister; Diabetes in his maternal grandmother; Diabetes Mellitus II in his father; Stroke in his maternal grandfather.  ? ?ROS:  Please see the history of present illness.   Otherwise, review of systems is positive for none.   All other systems are reviewed and negative.  ? ?PHYSICAL EXAM: ?VS:  There were no vitals taken for this visit. , BMI There is no height or weight on file to calculate BMI. ?GEN: Well nourished, well developed, in no acute distress  ?HEENT: normal  ?Neck: no JVD, carotid bruits, or masses ?Cardiac: RRR; no murmurs, rubs, or gallops,no edema  ?Respiratory:  clear to auscultation bilaterally, normal work of breathing ?GI: soft, nontender, nondistended, + BS ?MS: no deformity or atrophy  ?Skin: warm and dry ?Neuro:  Strength and sensation are intact ?Psych: euthymic mood, full affect ? ?Recent Labs: ?09/20/2020: Magnesium 1.8 ?05/05/2021: BUN 17; Creatinine, Ser 0.83; Hemoglobin 14.2; Platelets 251; Potassium 4.2; Sodium 139  ? ? ?Lipid Panel  ?   ?Component Value Date/Time  ? CHOL 150 05/05/2015 0901  ? TRIG 106 05/05/2015 0901  ? TRIG 47 02/24/2014 0834  ? HDL 46 05/05/2015 0901  ? HDL 51 02/24/2014 0834  ? CHOLHDL 3.3 05/05/2015 0901  ? [place] 83 05/05/2015 0901  ? ? ? ?Wt Readings from  Last 3 Encounters:  ?01/27/21 93.1 kg  ?11/14/20 92.1 kg  ?10/24/20 93.7 kg  ?  ? ? ?Other studies Reviewed: ?Additional studies/ records that were reviewed today include: TTE 10/18/20  ?Review of the above records today demonstrates:  ? 1. Left ventricular ejection fraction, by estimation, is 60 to 65%. The  ?left ventricle has normal function. The left ventricle has no regional  ?wall motion abnormalities. Left ventricular diastolic parameters were  ?normal.  ? 2. Right ventricular systolic function is normal. The right ventricular  ?size is normal.  ? 3. The mitral valve is  normal in structure. No evidence of mitral valve  ?regurgitation. No evidence of mitral stenosis.  ? 4. The aortic valve is tricuspid. Aortic valve regurgitation is not  ?visualized. No aortic stenosis is present.  ? 5. Aortic dilatation noted. There is borderline dilatation of the aortic  ?root, measuring 38 mm.  ? 6. The inferior vena cava is normal in size with greater than 50%  ?respiratory variability, suggesting right atrial pressure of 3 mmHg.  ? ? ?ASSESSMENT AND PLAN: ? ?1.  Typical atrial flutter: Garrett Mcknight has presented today for surgery, with the diagnosis of atrial flutter.  The various methods of treatment have been discussed with the patient and family. After consideration of risks, benefits and other options for treatment, the patient has consented to  Procedure(s): ?Catheter ablation as a surgical intervention .  Risks include but not limited to complete heart block, stroke, esophageal damage, nerve damage, bleeding, vascular damage, tamponade, perforation, MI, and death. The patient's history has been reviewed, patient examined, no change in status, stable for surgery.  I have reviewed the patient's chart and labs.  Questions were answered to the patient's satisfaction.   ? ?Allegra Lai, MD ?05/12/2021 ?11:54 AM  ? ?

## 2021-05-12 NOTE — Anesthesia Preprocedure Evaluation (Addendum)
Anesthesia Evaluation  ?Patient identified by MRN, date of birth, ID band ?Patient awake ? ? ? ?Reviewed: ?Allergy & Precautions, NPO status , Patient's Chart, lab work & pertinent test results ? ?Airway ?Mallampati: II ? ?TM Distance: >3 FB ?Neck ROM: Full ? ? ? Dental ?no notable dental hx. ? ?  ?Pulmonary ?sleep apnea and Continuous Positive Airway Pressure Ventilation ,  ?  ?Pulmonary exam normal ?breath sounds clear to auscultation ? ? ? ? ? ? Cardiovascular ?hypertension, Pt. on medications ?negative cardio ROS ?Normal cardiovascular exam ?Rhythm:Regular Rate:Normal ? ? ?  ?Neuro/Psych ? Neuromuscular disease (peripheral neuropathy) negative psych ROS  ? GI/Hepatic ?Neg liver ROS, GERD  ,  ?Endo/Other  ?negative endocrine ROSdiabetes, Type 2 ? Renal/GU ?negative Renal ROS  ?negative genitourinary ?  ?Musculoskeletal ? ?(+) Arthritis , Osteoarthritis,   ? Abdominal ?  ?Peds ?negative pediatric ROS ?(+)  Hematology ?negative hematology ROS ?(+)   ?Anesthesia Other Findings ? ? Reproductive/Obstetrics ?negative OB ROS ? ?  ? ? ? ? ? ? ? ? ? ? ? ? ? ?  ?  ? ? ? ? ? ? ? ?Anesthesia Physical ?Anesthesia Plan ? ?ASA: 3 ? ?Anesthesia Plan: General  ? ?Post-op Pain Management:   ? ?Induction: Intravenous ? ?PONV Risk Score and Plan: 2 and Treatment may vary due to age or medical condition, Ondansetron and Dexamethasone ? ?Airway Management Planned: Oral ETT ? ?Additional Equipment: None ? ?Intra-op Plan:  ? ?Post-operative Plan: Extubation in OR ? ?Informed Consent: I have reviewed the patients History and Physical, chart, labs and discussed the procedure including the risks, benefits and alternatives for the proposed anesthesia with the patient or authorized representative who has indicated his/her understanding and acceptance.  ? ? ? ?Dental advisory given ? ?Plan Discussed with: CRNA, Anesthesiologist and Surgeon ? ?Anesthesia Plan Comments:   ? ? ? ? ? ? ?Anesthesia Quick  Evaluation ? ?

## 2021-05-12 NOTE — Transfer of Care (Signed)
Immediate Anesthesia Transfer of Care Note ? ?Patient: Garrett Mcknight ? ?Procedure(s) Performed: A-FLUTTER ABLATION ? ?Patient Location: Cath Lab ? ?Anesthesia Type:General ? ?Level of Consciousness: drowsy and patient cooperative ? ?Airway & Oxygen Therapy: Patient Spontanous Breathing and Patient connected to nasal cannula oxygen ? ?Post-op Assessment: Report given to RN, Post -op Vital signs reviewed and stable and Patient moving all extremities ? ?Post vital signs: Reviewed and stable ? ?Last Vitals:  ?Vitals Value Taken Time  ?BP 151/83 05/12/21 1424  ?Temp 36.8 ?C 05/12/21 1424  ?Pulse 74 05/12/21 1425  ?Resp 15 05/12/21 1425  ?SpO2 98 % 05/12/21 1425  ?Vitals shown include unvalidated device data. ? ?Last Pain:  ?Vitals:  ? 05/12/21 1424  ?TempSrc: Temporal  ?PainSc: 0-No pain  ?   ? ?  ? ?Complications: There were no known notable events for this encounter. ?

## 2021-05-12 NOTE — Progress Notes (Signed)
Writer ambulated patient per MD request after 3 hours bedrest at 1700. Patient walked to restroom and shortly after called out and notified writer he was bleeding from right groin site post procedure. Writer assisted patient to bed and held pressure for 20 minutes. Bleeding stopped and redressed right groin. MD notified. Instructed to lay patient flat until 1800 and ambulate again. Will continue to monitor. ?

## 2021-05-12 NOTE — Anesthesia Postprocedure Evaluation (Signed)
Anesthesia Post Note ? ?Patient: Garrett Mcknight ? ?Procedure(s) Performed: A-FLUTTER ABLATION ? ?  ? ?Patient location during evaluation: Cath Lab ?Anesthesia Type: General ?Level of consciousness: awake and alert ?Pain management: pain level controlled ?Vital Signs Assessment: post-procedure vital signs reviewed and stable ?Respiratory status: spontaneous breathing, nonlabored ventilation, respiratory function stable and patient connected to nasal cannula oxygen ?Cardiovascular status: blood pressure returned to baseline and stable ?Postop Assessment: no apparent nausea or vomiting ?Anesthetic complications: no ? ? ?There were no known notable events for this encounter. ? ?Last Vitals:  ?Vitals:  ? 05/12/21 1515 05/12/21 1530  ?BP: 135/76 120/76  ?Pulse: 67 64  ?Resp: 14 13  ?Temp:    ?SpO2: 97% 94%  ?  ?Last Pain:  ?Vitals:  ? 05/12/21 1505  ?TempSrc:   ?PainSc: 0-No pain  ? ? ?  ?  ?  ?  ?  ?  ? ?Aarik Blank ? ? ? ? ?

## 2021-05-12 NOTE — Anesthesia Postprocedure Evaluation (Deleted)
Anesthesia Post Note ? ?Patient: Garrett Mcknight ? ?Procedure(s) Performed: A-FLUTTER ABLATION ? ?  ? ?Patient location during evaluation: Endoscopy ?Anesthesia Type: General ?Level of consciousness: awake and alert ?Pain management: pain level controlled ?Vital Signs Assessment: post-procedure vital signs reviewed and stable ?Respiratory status: spontaneous breathing, nonlabored ventilation, respiratory function stable and patient connected to nasal cannula oxygen ?Cardiovascular status: blood pressure returned to baseline and stable ?Postop Assessment: no apparent nausea or vomiting ?Anesthetic complications: no ? ? ?There were no known notable events for this encounter. ? ?Last Vitals:  ?Vitals:  ? 05/12/21 1425 05/12/21 1430  ?BP: (!) 151/83 139/82  ?Pulse: 74 73  ?Resp: 15 15  ?Temp:    ?SpO2: 98% 99%  ?  ?Last Pain:  ?Vitals:  ? 05/12/21 1424  ?TempSrc: Temporal  ?PainSc: 0-No pain  ? ? ?  ?  ?  ?  ?  ?  ? ?Garrett Mcknight ? ? ? ? ?

## 2021-05-15 ENCOUNTER — Encounter (HOSPITAL_COMMUNITY): Payer: Self-pay | Admitting: Cardiology

## 2021-05-15 NOTE — Addendum Note (Signed)
Addendum  created 05/15/21 1052 by Oleta Mouse, MD  ? [place] recorded in [place], [place] filed  ?  ?

## 2021-05-18 ENCOUNTER — Encounter: Payer: Self-pay | Admitting: *Deleted

## 2021-05-18 ENCOUNTER — Telehealth: Payer: Self-pay | Admitting: Cardiology

## 2021-05-18 NOTE — Telephone Encounter (Signed)
Pt is requesting a call back and also an e-mail for a work note releasing him to go back to work. His e-mail:  [email or web address] ?

## 2021-05-18 NOTE — Telephone Encounter (Signed)
Work note completed for pt. ?Pt appreciates our help with this. ?

## 2021-05-29 ENCOUNTER — Telehealth: Payer: Self-pay | Admitting: Cardiology

## 2021-05-29 NOTE — Telephone Encounter (Signed)
Pt states that he has been having long term and recent term blockage. Pt would like for nurse to callback. Please advise

## 2021-05-29 NOTE — Telephone Encounter (Signed)
Pt reporting that since procedure he has been having some sleeping difficulty. First night home he didn't sleep at all, and then the next few nights he slept perfectly.  Since then it has been hit or miss.  He is also having bouts of "complete blanks on memory".  This started about a week ago. It comes and goes. Example given is a speech he has been giving for years now and he can't remember it. 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 He does report his sister is starting to struggle with this as well.  Reports experieincing a fair amt of dizziness but no elevated HRs that he is aware of.  He reports that he stopped Atovastatin about a week ago b/c he was told long ago this was preventative anyway.  Pt aware I am going to review medication list with pharamacist this week to determine if any medications could be culprit to any of his concerns. Aware I will discuss switching his Toprol to see if dizziness resolves. Aware it will be some time this week before I will call him back. Patient verbalized understanding and agreeable to plan.

## 2021-06-01 NOTE — Telephone Encounter (Signed)
Forwarding to pharmD for review of medication list to see if any of his medications could be causing "blanks on memory", memory loss/issues

## 2021-06-02 DIAGNOSIS — E1165 Type 2 diabetes mellitus with hyperglycemia: Secondary | ICD-10-CM | POA: Diagnosis not present

## 2021-06-02 DIAGNOSIS — I10 Essential (primary) hypertension: Secondary | ICD-10-CM | POA: Diagnosis not present

## 2021-06-02 DIAGNOSIS — R413 Other amnesia: Secondary | ICD-10-CM | POA: Diagnosis not present

## 2021-06-02 DIAGNOSIS — Z820 Family history of epilepsy and other diseases of the nervous system: Secondary | ICD-10-CM | POA: Diagnosis not present

## 2021-06-02 DIAGNOSIS — R410 Disorientation, unspecified: Secondary | ICD-10-CM | POA: Diagnosis not present

## 2021-06-02 DIAGNOSIS — R42 Dizziness and giddiness: Secondary | ICD-10-CM | POA: Diagnosis not present

## 2021-06-02 NOTE — Telephone Encounter (Signed)
Beta blockers can contribute to memory issues but  not likely explaining the full extent of his symptoms. That's fine to try changing him to a different beta blocker but he should follow up with his PCP if symptoms don't improve after. He also needs to be on statin therapy since he has DM. Not sure if he noticed any improvement in memory after he stopped atorvastatin, but it's more lipophilic than other statins and there are some reports of memory issues with it too. Would replace with rosuvastatin '10mg'$  daily instead.

## 2021-06-02 NOTE — Telephone Encounter (Signed)
Pt informed that I still need to discuss w/ MD, apologized for the delay. Pt is fine with this. He took his Toprol just now for the first time in 3 days.  He will let me know how he felt, after taking it today, next week when we speak again. Aware will await MD orders and then will change cholesterol medication next week. Pt is agreeable to plan.

## 2021-06-09 NOTE — Telephone Encounter (Signed)
Take 1/2 every morning, started waking up during the night and difficulty going back to sleep. But feels like he is improved. Avg HRs 80s during the day. He is taking 25 mg daily for now and will let us know if he is having issues. He will see [place] next Friday for follow  up. Patient verbalized understanding and agreeable to plan.

## 2021-06-09 NOTE — Telephone Encounter (Signed)
Left message to call back  

## 2021-06-09 NOTE — Telephone Encounter (Signed)
Patient returned call

## 2021-06-14 DIAGNOSIS — K219 Gastro-esophageal reflux disease without esophagitis: Secondary | ICD-10-CM | POA: Diagnosis not present

## 2021-06-14 DIAGNOSIS — R42 Dizziness and giddiness: Secondary | ICD-10-CM | POA: Diagnosis not present

## 2021-06-14 DIAGNOSIS — H838X3 Other specified diseases of inner ear, bilateral: Secondary | ICD-10-CM | POA: Diagnosis not present

## 2021-06-14 DIAGNOSIS — K224 Dyskinesia of esophagus: Secondary | ICD-10-CM | POA: Diagnosis not present

## 2021-06-14 DIAGNOSIS — H903 Sensorineural hearing loss, bilateral: Secondary | ICD-10-CM | POA: Diagnosis not present

## 2021-06-16 ENCOUNTER — Ambulatory Visit (INDEPENDENT_AMBULATORY_CARE_PROVIDER_SITE_OTHER): Payer: Medicare Other

## 2021-06-16 ENCOUNTER — Ambulatory Visit (INDEPENDENT_AMBULATORY_CARE_PROVIDER_SITE_OTHER): Payer: Medicare Other | Admitting: Cardiology

## 2021-06-16 ENCOUNTER — Encounter: Payer: Self-pay | Admitting: Cardiology

## 2021-06-16 VITALS — BP 126/78 | HR 76 | Ht 72.0 in | Wt 206.4 lb

## 2021-06-16 DIAGNOSIS — I483 Typical atrial flutter: Secondary | ICD-10-CM

## 2021-06-16 MED ORDER — DILTIAZEM HCL ER COATED BEADS 120 MG PO CP24: 120.0000 mg | ORAL_CAPSULE | Freq: Every day | ORAL | 3 refills | Status: DC

## 2021-06-16 NOTE — Patient Instructions (Signed)
Medication Instructions:  Your physician has recommended you make the following change in your medication:  STOP Metoprolol START Diltiazem 120 mg once daily  *If you need a refill on your cardiac medications before your next appointment, please call your pharmacy*   Lab Work: None ordered   Testing/Procedures:                           [facility] Monitor Instructions  Your physician has requested you wear a ZIO patch monitor for 14 days.  This is a single patch monitor. [employer] supplies one patch monitor per enrollment. Additional stickers are not available. Please do not apply patch if you will be having a Nuclear Stress Test,  Echocardiogram, Cardiac CT, MRI, or Chest Xray during the period you would be wearing the  monitor. The patch cannot be worn during these tests. You cannot remove and re-apply the  ZIO XT patch monitor.  Your ZIO patch monitor will be mailed 3 day USPS to your address on file. It may take 3-5 days  to receive your monitor after you have been enrolled.  Once you have received your monitor, please review the enclosed instructions. Your monitor  has already been registered assigning a specific monitor serial # to you.  Billing and Patient Assistance Program Information  We have supplied [employer] with any of your insurance information on file for billing purposes. [employer] offers a sliding scale Patient Assistance Program for patients that do not have  insurance, or whose insurance does not completely cover the cost of the ZIO monitor.  You must apply for the Patient Assistance Program to qualify for this discounted rate.  To apply, please call [employer] at [phone], select option 4, select option 2, ask to apply for  Patient Assistance Program. Theodore Demark will ask your household income, and how many people  are in your household. They will quote your out-of-pocket cost based on that information.  [employer] will also be able to set up a 72-month  interest-free payment plan if needed.  Applying the monitor   Shave hair from upper left chest.  Hold abrader disc by orange tab. Rub abrader in 40 strokes over the upper left chest as  indicated in your monitor instructions.  Clean area with 4 enclosed alcohol pads. Let dry.  Apply patch as indicated in monitor instructions. Patch will be placed under collarbone on left  side of chest with arrow pointing upward.  Rub patch adhesive wings for 2 minutes. Remove white label marked "1". Remove the white  label marked "2". Rub patch adhesive wings for 2 additional minutes.  While looking in a mirror, press and release button in center of patch. A small green light will  flash 3-4 times. This will be your only indicator that the monitor has been turned on.  Do not shower for the first 24 hours. You may shower after the first 24 hours.  Press the button if you feel a symptom. You will hear a small click. Record Date, Time and  Symptom in the Patient Logbook.  When you are ready to remove the patch, follow instructions on the last 2 pages of Patient  Logbook. Stick patch monitor onto the last page of Patient Logbook.  Place Patient Logbook in the blue and white box. Use locking tab on box and tape box closed  securely. The blue and white box has prepaid postage on it. Please place it in the mailbox as  soon as possible. Your physician should have your test results approximately 7 days after the  monitor has been mailed back to [facility].  Call [facility] at [phone] if you have questions regarding  your ZIO XT patch monitor. Call them immediately if you see an orange light blinking on your  monitor.  If your monitor falls off in less than 4 days, contact our Monitor department at [phone].  If your monitor becomes loose or falls off after 4 days call [employer] at [phone] for  suggestions on securing your monitor     Follow-Up: At [facility], you and  your health needs are our priority.  As part of our continuing mission to provide you with exceptional heart care, we have created designated Provider Care Teams.  These Care Teams include your primary Cardiologist (physician) and Advanced Practice Providers (APPs -  Physician Assistants and Nurse Practitioners) who all work together to provide you with the care you need, when you need it.  We recommend signing up for the patient portal called "MyChart".  Sign up information is provided on this After Visit Summary.  MyChart is used to connect with patients for Virtual Visits (Telemedicine).  Patients are able to view lab/test results, encounter notes, upcoming appointments, etc.  Non-urgent messages can be sent to your provider as well.   To learn more about what you can do with MyChart, go to [email or web address].    Your next appointment:   To   be determined after monitor is completed/reviewed  The format for your next appointment:   In Person  Provider:   Allegra Lai, MD    Thank you for choosing [facility]!!   Trinidad Curet, RN [phone]  Other Instructions   Important Information About Sugar

## 2021-06-16 NOTE — Progress Notes (Unsigned)
Enrolled pt for ZIO XT 14 days to be mailed to home address

## 2021-06-16 NOTE — Progress Notes (Signed)
Electrophysiology Office Note   Date:  06/16/2021   ID:  Garrett Mcknight, DOB 1950-04-19, MRN [id number]  PCP:  Curly Rim, MD  Cardiologist:   Primary Electrophysiologist:  Sostenes Kauffmann Meredith Leeds, MD    Chief Complaint: atrial flutter   History of Present Illness: Garrett Mcknight is a 71 y.o. male who is being seen today for the evaluation of atrial flutter at the request of No ref. provider found. Presenting today for electrophysiology evaluation.  He has a history significant for diabetes, hypertension, OSA, atrial flutter.  He presented to the hospital 09/20/2020 and was diagnosed with atrial flutter.  He was started on Eliquis.  He is now status post ablation for atrial flutter on 05/12/2021.  Today, denies symptoms of chest pain, shortness of breath, orthopnea, PND, lower extremity edema, claudication, dizziness, presyncope, syncope, bleeding, or neurologic sequela. The patient is tolerating medications without difficulties.  Since his ablation, he has continued to have episodic palpitations.  His palpitations occur on a weekly basis, sometimes multiple times a week.  There is no exacerbating or alleviating factor to his palpitations.  He states that this is not quite similar to his atrial flutter.   Past Medical History:  Diagnosis Date   Arthritis    Diabetes ([place])    GERD (gastroesophageal reflux disease)    Hypertension    Seasonal allergies    Sleep apnea    uses CPAP   Past Surgical History:  Procedure Laterality Date   A-FLUTTER ABLATION N/A 05/12/2021   Procedure: A-FLUTTER ABLATION;  Surgeon: Constance Haw, MD;  Location: [facility];  Service: Cardiovascular;  Laterality: N/A;   fibroma     removed from scalp 35 years ago   L5 lumbar surgery     Left bicep tendoesis     Left knee ACL repaired     Left knee replacement     Left rotary cuff repair     RHINOPHYMA RESECTION     Right knee meniscus     Right wrist reconstruction     TOTAL KNEE  ARTHROPLASTY Right 06/01/2014   Procedure: RIGHT TOTAL KNEE ARTHROPLASTY;  Surgeon: Latanya Maudlin, MD;  Location: [facility];  Service: Orthopedics;  Laterality: Right;     Current Outpatient Medications  Medication Sig Dispense Refill   acetaminophen (TYLENOL) 500 MG tablet Take 1,000 mg by mouth every 6 (six) hours as needed for moderate pain.     atorvastatin (LIPITOR) 10 MG tablet Take 10 mg by mouth daily.     cetirizine (ZYRTEC) 10 MG tablet Take 10 mg by mouth daily.     diltiazem (CARDIZEM CD) 120 MG 24 hr capsule Take 1 capsule (120 mg total) by mouth daily. 30 capsule 3   fluticasone (FLONASE) 50 MCG/ACT nasal spray Place 1 spray into both nostrils daily.     glimepiride (AMARYL) 4 MG tablet Take 4 mg by mouth daily after breakfast.     lisinopril (ZESTRIL) 10 MG tablet Take 15 mg by mouth daily.     Melatonin 10 MG SUBL Place 10 mg under the tongue at bedtime as needed (sleep).     metFORMIN (GLUCOPHAGE-XR) 500 MG 24 hr tablet Take 1,000 mg by mouth 2 (two) times daily.     omeprazole (PRILOSEC) 20 MG capsule Take 20 mg by mouth daily as needed (acid reflux).     OVER THE COUNTER MEDICATION Take 1 tablet by mouth at bedtime as needed (neuropathy). nerve comfort supplement     Polyethyl  Glycol-Propyl Glycol (SYSTANE ULTRA) 0.4-0.3 % SOLN Place 1 drop into both eyes daily as needed (dry eyes).     RYBELSUS 7 MG TABS Take 14 mg by mouth daily.     apixaban (ELIQUIS) 5 MG TABS tablet Take 1 tablet (5 mg total) by mouth 2 (two) times daily. 60 tablet 3   No current facility-administered medications for this visit.    Allergies:   Dilaudid [hydromorphone hcl]   Social History:  The patient  reports that he has never smoked. He has never used smokeless tobacco. He reports current alcohol use. He reports that he does not use drugs.   Family History:  The patient's family history includes Breast cancer in his sister; Diabetes in his maternal grandmother; Diabetes Mellitus II in his father;  Stroke in his maternal grandfather.   ROS:  Please see the history of present illness.   Otherwise, review of systems is positive for none.   All other systems are reviewed and negative.   PHYSICAL EXAM: VS:  BP 126/78   Pulse 76   Ht 6' (1.829 m)   Wt 206 lb 6.4 oz (93.6 kg)   SpO2 97%   BMI 27.99 kg/m  , BMI Body mass index is 27.99 kg/m. GEN: Well nourished, well developed, in no acute distress  HEENT: normal  Neck: no JVD, carotid bruits, or masses Cardiac: RRR; no murmurs, rubs, or gallops,no edema  Respiratory:  clear to auscultation bilaterally, normal work of breathing GI: soft, nontender, nondistended, + BS MS: no deformity or atrophy  Skin: warm and dry Neuro:  Strength and sensation are intact Psych: euthymic mood, full affect  EKG:  EKG is ordered today. Personal review of the ekg ordered shows sinus rhythm, rate 76   Recent Labs: 09/20/2020: Magnesium 1.8 05/05/2021: BUN 17; Creatinine, Ser 0.83; Hemoglobin 14.2; Platelets 251; Potassium 4.2; Sodium 139    Lipid Panel     Component Value Date/Time   CHOL 150 05/05/2015 0901   TRIG 106 05/05/2015 0901   TRIG 47 02/24/2014 0834   HDL 46 05/05/2015 0901   HDL 51 02/24/2014 0834   CHOLHDL 3.3 05/05/2015 0901   LDLCALC 83 05/05/2015 0901     Wt Readings from Last 3 Encounters:  06/16/21 206 lb 6.4 oz (93.6 kg)  05/12/21 200 lb (90.7 kg)  01/27/21 205 lb 3.2 oz (93.1 kg)      Other studies Reviewed: Additional studies/ records that were reviewed today include: TTE 10/18/20  Review of the above records today demonstrates:   1. Left ventricular ejection fraction, by estimation, is 60 to 65%. The  left ventricle has normal function. The left ventricle has no regional  wall motion abnormalities. Left ventricular diastolic parameters were  normal.   2. Right ventricular systolic function is normal. The right ventricular  size is normal.   3. The mitral valve is normal in structure. No evidence of mitral  valve  regurgitation. No evidence of mitral stenosis.   4. The aortic valve is tricuspid. Aortic valve regurgitation is not  visualized. No aortic stenosis is present.   5. Aortic dilatation noted. There is borderline dilatation of the aortic  root, measuring 38 mm.   6. The inferior vena cava is normal in size with greater than 50%  respiratory variability, suggesting right atrial pressure of 3 mmHg.    ASSESSMENT AND PLAN:  1.  Typical atrial flutter: CHA2DS2-VASc of 3.  Currently on Eliquis 5 mg twice daily.  Status post ablation 05/12/2021.  Unfortunately is continued to have palpitations.  He is unsure whether or not this is atrial flutter, but it does feel different to him.  Due to that, we Dusan Lipford have him wear a 2-week monitor.  He is also experiencing some brain fog with metoprolol.  We Jairus Tonne stop metoprolol start diltiazem 120 mg daily.  2.  Obstructive sleep apnea: CPAP compliance encouraged  3.  Hypertension: Currently well controlled  4.  Aortic root dilation: 38 mm on echo.  Plan per primary cardiology.  Current medicines are reviewed at length with the patient today.   The patient does not have concerns regarding his medicines.  The following changes were made today: Stop metoprolol start diltiazem  Labs/ tests ordered today include:  Orders Placed This Encounter  Procedures   LONG TERM MONITOR (3-14 DAYS)   EKG 12-Lead     Disposition:   FU with Kaelynn Igo pending monitor months  Signed, Garrett Tourigny Meredith Leeds, MD  06/16/2021 12:37 PM     [facility] [address] [place] [address] [phone] (office) [phone] (fax)

## 2021-06-20 DIAGNOSIS — I483 Typical atrial flutter: Secondary | ICD-10-CM | POA: Diagnosis not present

## 2021-06-30 DIAGNOSIS — E041 Nontoxic single thyroid nodule: Secondary | ICD-10-CM | POA: Diagnosis not present

## 2021-06-30 DIAGNOSIS — E042 Nontoxic multinodular goiter: Secondary | ICD-10-CM | POA: Diagnosis not present

## 2021-07-13 DIAGNOSIS — I483 Typical atrial flutter: Secondary | ICD-10-CM | POA: Diagnosis not present

## 2021-07-17 DIAGNOSIS — D125 Benign neoplasm of sigmoid colon: Secondary | ICD-10-CM | POA: Diagnosis not present

## 2021-07-17 DIAGNOSIS — D123 Benign neoplasm of transverse colon: Secondary | ICD-10-CM | POA: Diagnosis not present

## 2021-07-17 DIAGNOSIS — Z86010 Personal history of colonic polyps: Secondary | ICD-10-CM | POA: Diagnosis not present

## 2021-07-17 DIAGNOSIS — D122 Benign neoplasm of ascending colon: Secondary | ICD-10-CM | POA: Diagnosis not present

## 2021-07-17 DIAGNOSIS — Z09 Encounter for follow-up examination after completed treatment for conditions other than malignant neoplasm: Secondary | ICD-10-CM | POA: Diagnosis not present

## 2021-07-24 DIAGNOSIS — E042 Nontoxic multinodular goiter: Secondary | ICD-10-CM | POA: Diagnosis not present

## 2021-08-28 DIAGNOSIS — I10 Essential (primary) hypertension: Secondary | ICD-10-CM | POA: Diagnosis not present

## 2021-08-28 DIAGNOSIS — G4733 Obstructive sleep apnea (adult) (pediatric): Secondary | ICD-10-CM | POA: Diagnosis not present

## 2021-08-28 DIAGNOSIS — M159 Polyosteoarthritis, unspecified: Secondary | ICD-10-CM | POA: Diagnosis not present

## 2021-08-28 DIAGNOSIS — M65342 Trigger finger, left ring finger: Secondary | ICD-10-CM | POA: Diagnosis not present

## 2021-08-28 DIAGNOSIS — G629 Polyneuropathy, unspecified: Secondary | ICD-10-CM | POA: Diagnosis not present

## 2021-08-28 DIAGNOSIS — M79641 Pain in right hand: Secondary | ICD-10-CM | POA: Diagnosis not present

## 2021-09-06 DIAGNOSIS — G4733 Obstructive sleep apnea (adult) (pediatric): Secondary | ICD-10-CM | POA: Diagnosis not present

## 2021-09-21 DIAGNOSIS — M65341 Trigger finger, right ring finger: Secondary | ICD-10-CM | POA: Diagnosis not present

## 2021-09-21 DIAGNOSIS — I10 Essential (primary) hypertension: Secondary | ICD-10-CM | POA: Diagnosis not present

## 2021-09-27 DIAGNOSIS — Z23 Encounter for immunization: Secondary | ICD-10-CM | POA: Diagnosis not present

## 2021-10-19 DIAGNOSIS — G4733 Obstructive sleep apnea (adult) (pediatric): Secondary | ICD-10-CM | POA: Diagnosis not present

## 2021-10-24 DIAGNOSIS — Z23 Encounter for immunization: Secondary | ICD-10-CM | POA: Diagnosis not present

## 2021-11-21 ENCOUNTER — Other Ambulatory Visit: Payer: Self-pay | Admitting: *Deleted

## 2021-11-21 MED ORDER — DILTIAZEM HCL ER COATED BEADS 120 MG PO CP24: 120.0000 mg | ORAL_CAPSULE | Freq: Every day | ORAL | 3 refills | Status: DC

## 2021-12-07 DIAGNOSIS — H25813 Combined forms of age-related cataract, bilateral: Secondary | ICD-10-CM | POA: Diagnosis not present

## 2021-12-07 DIAGNOSIS — H43813 Vitreous degeneration, bilateral: Secondary | ICD-10-CM | POA: Diagnosis not present

## 2021-12-07 DIAGNOSIS — H5203 Hypermetropia, bilateral: Secondary | ICD-10-CM | POA: Diagnosis not present

## 2021-12-07 DIAGNOSIS — H16223 Keratoconjunctivitis sicca, not specified as Sjogren's, bilateral: Secondary | ICD-10-CM | POA: Diagnosis not present

## 2021-12-07 DIAGNOSIS — E119 Type 2 diabetes mellitus without complications: Secondary | ICD-10-CM | POA: Diagnosis not present

## 2021-12-08 DIAGNOSIS — Z91040 Latex allergy status: Secondary | ICD-10-CM | POA: Diagnosis not present

## 2021-12-14 DIAGNOSIS — L309 Dermatitis, unspecified: Secondary | ICD-10-CM | POA: Diagnosis not present

## 2021-12-14 DIAGNOSIS — I10 Essential (primary) hypertension: Secondary | ICD-10-CM | POA: Diagnosis not present

## 2021-12-14 DIAGNOSIS — E1165 Type 2 diabetes mellitus with hyperglycemia: Secondary | ICD-10-CM | POA: Diagnosis not present

## 2021-12-14 DIAGNOSIS — E782 Mixed hyperlipidemia: Secondary | ICD-10-CM | POA: Diagnosis not present

## 2022-01-02 DIAGNOSIS — G4733 Obstructive sleep apnea (adult) (pediatric): Secondary | ICD-10-CM | POA: Diagnosis not present

## 2022-01-02 DIAGNOSIS — I10 Essential (primary) hypertension: Secondary | ICD-10-CM | POA: Diagnosis not present

## 2022-01-02 DIAGNOSIS — E119 Type 2 diabetes mellitus without complications: Secondary | ICD-10-CM | POA: Diagnosis not present

## 2022-01-02 DIAGNOSIS — K219 Gastro-esophageal reflux disease without esophagitis: Secondary | ICD-10-CM | POA: Diagnosis not present

## 2022-01-02 DIAGNOSIS — I4892 Unspecified atrial flutter: Secondary | ICD-10-CM | POA: Diagnosis not present

## 2022-01-02 DIAGNOSIS — H25812 Combined forms of age-related cataract, left eye: Secondary | ICD-10-CM | POA: Diagnosis not present

## 2022-01-02 DIAGNOSIS — Z01818 Encounter for other preprocedural examination: Secondary | ICD-10-CM | POA: Diagnosis not present

## 2022-02-16 DIAGNOSIS — J320 Chronic maxillary sinusitis: Secondary | ICD-10-CM | POA: Insufficient documentation

## 2022-03-26 ENCOUNTER — Other Ambulatory Visit: Payer: Self-pay

## 2022-03-26 MED ORDER — DILTIAZEM HCL ER COATED BEADS 120 MG PO CP24: 120.0000 mg | ORAL_CAPSULE | Freq: Every day | ORAL | 2 refills | Status: DC

## 2022-04-12 ENCOUNTER — Telehealth: Payer: Self-pay | Admitting: Cardiology

## 2022-04-12 NOTE — Telephone Encounter (Signed)
STAT if HR is under 50 or over 120 (normal HR is 60-100 beats per minute)  What is your heart rate?  Last week - 130  This morning - 150  Now - 85   Do you have a log of your heart rate readings (document readings)?   Do you have any other symptoms?  Pt states he can feel his heart racing faster and he is a little lightheaded.   Pt he has been

## 2022-04-12 NOTE — Telephone Encounter (Signed)
Tried to call patient back, no answer.

## 2022-04-12 NOTE — Telephone Encounter (Signed)
He states he has a call block on. Informed him the only way RN will be able to reach you is through call since he does not have mychart, he states he will try to answer when called.

## 2022-04-13 NOTE — Telephone Encounter (Signed)
Call could not be completed at this time.  

## 2022-04-18 NOTE — Telephone Encounter (Signed)
Spoke with patient to follow-up on previous call regarding elevated HR.  Patient states he had 2 episodes about 2 weeks ago of elevated HR, maximum 130 bpm and then came down to 85.  Patient reports another episode about 3-5 days ago when he felt a gas-like sensation in his chest briefly and noted his HR was 152. He states he did not drink much water this day. He took his usual dose of diltiazem around 8pm that night and woke up around 2-3am and his HR was still elevated so he took an extra dose of diltiazem, and by late morning his HR returned to normal in the 80s.  Patient states he does not drink much fluid throughout the day. He reports drinking 2.5 cups of coffee in the mornings, and having a "little water" during lunch. He states when he saw Dr. Elberta Fortis previously he had a similar issue and received IV fluids and that seemed to help. Patient states he has noticed he does not seem to have much issue with elevated HR when he drinks more fluids throughout the day, though he has difficulty doing this consistently.  Patient also notes his EKG reading on his watch reports "inconclusive" as far as his rhythm.   Encouraged patient to work on drinking more fluids throughout the day, patient verbalized understanding.

## 2022-04-20 NOTE — Telephone Encounter (Signed)
Followed up with pt. Scheduled him to see Dr. Elberta Fortis next Tuesday. Patient verbalized understanding and agreeable to plan.  Advised to call over weekend, if needed, for advisement if HR/symptoms prior to OV.

## 2022-04-24 ENCOUNTER — Encounter: Payer: Self-pay | Admitting: Cardiology

## 2022-04-24 ENCOUNTER — Ambulatory Visit: Payer: Medicare Other | Attending: Cardiology | Admitting: Cardiology

## 2022-04-24 VITALS — BP 114/72 | HR 71 | Ht 72.0 in | Wt 195.0 lb

## 2022-04-24 DIAGNOSIS — I471 Supraventricular tachycardia, unspecified: Secondary | ICD-10-CM | POA: Insufficient documentation

## 2022-04-24 DIAGNOSIS — I483 Typical atrial flutter: Secondary | ICD-10-CM | POA: Insufficient documentation

## 2022-04-24 MED ORDER — DILTIAZEM HCL ER COATED BEADS 240 MG PO CP24: 240.0000 mg | ORAL_CAPSULE | Freq: Every day | ORAL | 3 refills | Status: DC

## 2022-04-24 NOTE — Progress Notes (Signed)
Electrophysiology Office Note   Date:  04/24/2022   ID:  Garrett Mcknight, DOB 05-16-1950, MRN [id number]  PCP:  Vivien Presto, MD  Cardiologist:   Primary Electrophysiologist:  Helayna Dun Jorja Loa, MD    Chief Complaint: atrial flutter   History of Present Illness: Garrett Mcknight is a 72 y.o. male who is being seen today for the evaluation of atrial flutter at the request of Corrington, Kip A, MD. Presenting today for electrophysiology evaluation.  He has a history significant for diabetes, hypertension, OSA, atrial flutter.  He presented to the hospital on 09/20/2020 with atrial flutter.  He is now status post ablation 05/12/2021.  He continued to complain of palpitations and wore cardiac monitor that showed elevated PACs and PVCs.  Today, denies symptoms of palpitations, chest pain, shortness of breath, orthopnea, PND, lower extremity edema, claudication, dizziness, presyncope, syncope, bleeding, or neurologic sequela. The patient is tolerating medications without difficulties.  He is continue to have palpitations.  Palpitations occur every few days.  Despite this, he continues to be able to exercise without issue.  He mows his lawn at a steep grade and uses a push mower.  He does this for approximately an hour and has done this over the last 3 days.  He has some mild shortness of breath associated with his palpitations.   Past Medical History:  Diagnosis Date   Arthritis    Diabetes    GERD (gastroesophageal reflux disease)    Hypertension    Seasonal allergies    Sleep apnea    uses CPAP   Past Surgical History:  Procedure Laterality Date   A-FLUTTER ABLATION N/A 05/12/2021   Procedure: A-FLUTTER ABLATION;  Surgeon: Regan Lemming, MD;  Location: [facility];  Service: Cardiovascular;  Laterality: N/A;   fibroma     removed from scalp 35 years ago   L5 lumbar surgery     Left bicep tendoesis     Left knee ACL repaired     Left knee replacement     Left  rotary cuff repair     RHINOPHYMA RESECTION     Right knee meniscus     Right wrist reconstruction     TOTAL KNEE ARTHROPLASTY Right 06/01/2014   Procedure: RIGHT TOTAL KNEE ARTHROPLASTY;  Surgeon: Ranee Gosselin, MD;  Location: [facility];  Service: Orthopedics;  Laterality: Right;     Current Outpatient Medications  Medication Sig Dispense Refill   acetaminophen (TYLENOL) 500 MG tablet Take 1,000 mg by mouth every 6 (six) hours as needed for moderate pain.     aspirin EC 81 MG tablet Take 81 mg by mouth daily.     atorvastatin (LIPITOR) 10 MG tablet Take 10 mg by mouth daily.     diltiazem (CARDIZEM CD) 240 MG 24 hr capsule Take 1 capsule (240 mg total) by mouth daily. 90 capsule 3   fexofenadine (ALLEGRA) 180 MG tablet Take 180 mg by mouth daily.     fluticasone (FLONASE) 50 MCG/ACT nasal spray Place 1 spray into both nostrils daily.     glimepiride (AMARYL) 4 MG tablet Take 4 mg by mouth daily after breakfast.     lisinopril (ZESTRIL) 10 MG tablet Take 15 mg by mouth daily.     Melatonin 10 MG SUBL Place 10 mg under the tongue at bedtime as needed (sleep).     metFORMIN (GLUCOPHAGE-XR) 500 MG 24 hr tablet Take 1,000 mg by mouth 2 (two) times daily.     omeprazole (  PRILOSEC) 20 MG capsule Take 20 mg by mouth daily as needed (acid reflux).     OVER THE COUNTER MEDICATION Take 1 tablet by mouth at bedtime as needed (neuropathy). nerve comfort supplement     Polyethyl Glycol-Propyl Glycol (SYSTANE ULTRA) 0.4-0.3 % SOLN Place 1 drop into both eyes daily as needed (dry eyes).     RYBELSUS 7 MG TABS Take 14 mg by mouth daily.     cetirizine (ZYRTEC) 10 MG tablet Take 10 mg by mouth daily. (Patient not taking: Reported on 04/24/2022)     No current facility-administered medications for this visit.    Allergies:   Dilaudid [hydromorphone hcl]   Social History:  The patient  reports that he has never smoked. He has never used smokeless tobacco. He reports current alcohol use. He reports that he  does not use drugs.   Family History:  The patient's family history includes Breast cancer in his sister; Diabetes in his maternal grandmother; Diabetes Mellitus II in his father; Stroke in his maternal grandfather.   ROS:  Please see the history of present illness.   Otherwise, review of systems is positive for none.   All other systems are reviewed and negative.   PHYSICAL EXAM: VS:  BP 114/72   Pulse 71   Ht 6' (1.829 m)   Wt 195 lb (88.5 kg)   SpO2 99%   BMI 26.45 kg/m  , BMI Body mass index is 26.45 kg/m. GEN: Well nourished, well developed, in no acute distress  HEENT: normal  Neck: no JVD, carotid bruits, or masses Cardiac: RRR; no murmurs, rubs, or gallops,no edema  Respiratory:  clear to auscultation bilaterally, normal work of breathing GI: soft, nontender, nondistended, + BS MS: no deformity or atrophy  Skin: warm and dry Neuro:  Strength and sensation are intact Psych: euthymic mood, full affect  EKG:  EKG is ordered today. Personal review of the ekg ordered shows sinus rhythm   Recent Labs: 05/05/2021: BUN 17; Creatinine, Ser 0.83; Hemoglobin 14.2; Platelets 251; Potassium 4.2; Sodium 139    Lipid Panel     Component Value Date/Time   CHOL 150 05/05/2015 0901   TRIG 106 05/05/2015 0901   TRIG 47 02/24/2014 0834   HDL 46 05/05/2015 0901   HDL 51 02/24/2014 0834   CHOLHDL 3.3 05/05/2015 0901   LDLCALC 83 05/05/2015 0901     Wt Readings from Last 3 Encounters:  04/24/22 195 lb (88.5 kg)  06/16/21 206 lb 6.4 oz (93.6 kg)  05/12/21 200 lb (90.7 kg)      Other studies Reviewed: Additional studies/ records that were reviewed today include: TTE 10/18/20  Review of the above records today demonstrates:   1. Left ventricular ejection fraction, by estimation, is 60 to 65%. The  left ventricle has normal function. The left ventricle has no regional  wall motion abnormalities. Left ventricular diastolic parameters were  normal.   2. Right ventricular  systolic function is normal. The right ventricular  size is normal.   3. The mitral valve is normal in structure. No evidence of mitral valve  regurgitation. No evidence of mitral stenosis.   4. The aortic valve is tricuspid. Aortic valve regurgitation is not  visualized. No aortic stenosis is present.   5. Aortic dilatation noted. There is borderline dilatation of the aortic  root, measuring 38 mm.   6. The inferior vena cava is normal in size with greater than 50%  respiratory variability, suggesting right atrial pressure of 3  mmHg.    ASSESSMENT AND PLAN:  1.  Typical atrial flutter: CHA2DS2-VASc of 3.  Currently on Eliquis.  Status post ablation 05/12/2021.  Recent cardiac monitor without evidence of atrial flutter.  2.  Obstructive sleep apnea: CPAP compliance encouraged  3.  Hypertension: Currently well-controlled  4.  Aortic root dilation: 38 mm on most recent echo.  Plan per primary cardiology.  5.  PVCs/PACs: Elevated burden.  Currently on diltiazem and metoprolol.  6.  SVT: Noted on Apple Watch.  He did take an extra dose of diltiazem which improved his symptoms.  Garrett Mcknight increase diltiazem to 120 mg.   Current medicines are reviewed at length with the patient today.   The patient does not have concerns regarding his medicines.  The following changes were made today: Increase diltiazem  Labs/ tests ordered today include:  Orders Placed This Encounter  Procedures   EKG 12-Lead     Disposition:   FU with Garrett Mcknight 6 months  Signed, Garrett Mcknight Jorja Loa, MD  04/24/2022 9:20 AM     [facility] [address] [place] [address] [phone] (office) [phone] (fax)

## 2022-04-24 NOTE — Patient Instructions (Signed)
Medication Instructions:  Your physician has recommended you make the following change in your medication:  INCREASE Diltiazem to 240 mg once daily  *If you need a refill on your cardiac medications before your next appointment, please call your pharmacy*   Lab Work: None ordered   Testing/Procedures: None ordered   Follow-Up: At [facility], you and your health needs are our priority.  As part of our continuing mission to provide you with exceptional heart care, we have created designated Provider Care Teams.  These Care Teams include your primary Cardiologist (physician) and Advanced Practice Providers (APPs -  Physician Assistants and Nurse Practitioners) who all work together to provide you with the care you need, when you need it.  We recommend signing up for the patient portal called "MyChart".  Sign up information is provided on this After Visit Summary.  MyChart is used to connect with patients for Virtual Visits (Telemedicine).  Patients are able to view lab/test results, encounter notes, upcoming appointments, etc.  Non-urgent messages can be sent to your provider as well.   To learn more about what you can do with MyChart, go to [email or web address].    Your next appointment:   6 month(s)  The format for your next appointment:   In Person  Provider:   Loman Brooklyn, MD    Thank you for choosing [facility]!!   Dory Horn, RN [phone]

## 2022-06-12 ENCOUNTER — Other Ambulatory Visit: Payer: Self-pay

## 2022-06-12 MED ORDER — DILTIAZEM HCL ER COATED BEADS 240 MG PO CP24: 240.0000 mg | ORAL_CAPSULE | Freq: Every day | ORAL | 3 refills | Status: DC

## 2022-09-11 ENCOUNTER — Ambulatory Visit: Payer: Medicare Other | Admitting: Urology

## 2023-03-11 DIAGNOSIS — H9193 Unspecified hearing loss, bilateral: Secondary | ICD-10-CM | POA: Insufficient documentation

## 2023-05-10 ENCOUNTER — Encounter: Payer: Self-pay | Admitting: Family Medicine

## 2023-05-10 ENCOUNTER — Ambulatory Visit (INDEPENDENT_AMBULATORY_CARE_PROVIDER_SITE_OTHER): Admitting: Family Medicine

## 2023-05-10 VITALS — BP 90/57 | HR 68 | Temp 98.3°F | Ht 72.0 in | Wt 185.0 lb

## 2023-05-10 DIAGNOSIS — G4733 Obstructive sleep apnea (adult) (pediatric): Secondary | ICD-10-CM

## 2023-05-10 DIAGNOSIS — Z8639 Personal history of other endocrine, nutritional and metabolic disease: Secondary | ICD-10-CM

## 2023-05-10 DIAGNOSIS — E1159 Type 2 diabetes mellitus with other circulatory complications: Secondary | ICD-10-CM | POA: Diagnosis not present

## 2023-05-10 DIAGNOSIS — Z9889 Other specified postprocedural states: Secondary | ICD-10-CM | POA: Diagnosis not present

## 2023-05-10 DIAGNOSIS — K089 Disorder of teeth and supporting structures, unspecified: Secondary | ICD-10-CM

## 2023-05-10 DIAGNOSIS — I152 Hypertension secondary to endocrine disorders: Secondary | ICD-10-CM | POA: Insufficient documentation

## 2023-05-10 DIAGNOSIS — E1169 Type 2 diabetes mellitus with other specified complication: Secondary | ICD-10-CM | POA: Diagnosis not present

## 2023-05-10 DIAGNOSIS — Z8679 Personal history of other diseases of the circulatory system: Secondary | ICD-10-CM

## 2023-05-10 DIAGNOSIS — M5442 Lumbago with sciatica, left side: Secondary | ICD-10-CM

## 2023-05-10 DIAGNOSIS — G8929 Other chronic pain: Secondary | ICD-10-CM

## 2023-05-10 DIAGNOSIS — Z794 Long term (current) use of insulin: Secondary | ICD-10-CM

## 2023-05-10 LAB — BAYER DCA HB A1C WAIVED: HB A1C (BAYER DCA - WAIVED): 10.7 % — ABNORMAL HIGH (ref 4.8–5.6)

## 2023-05-10 LAB — LIPID PANEL

## 2023-05-10 NOTE — Progress Notes (Signed)
 New Patient Office Visit  Subjective   Patient ID: Garrett Mcknight, male    DOB: May 18, 1950  Age: 73 y.o. MRN: [id number]  CC:  Chief Complaint  Patient presents with   New Patient (Initial Visit)    Establish care Diabetic Sleep disorder   HPI Garrett Mcknight presents to establish care He was previously a patient of [facility] then established with [facility] while he was working in [place]. He has a PMH of Atrial flutter s/p ablation, OSA, T2DM  States that he has a history of HLD in a "one time event". It has since been controlled. He is established on lipitor and tolerating well.  He states that GERD has resolved.  He is wearing his CPAP for OSA. He is looking into getting the inspire.  States that BP is controlled at home. 120/70s. He is compliant with lisinopril . States that he has had some low BP's at home from 100s-110s systolic.  Has history of thyroid  nodule.  Established with rheumatology and is receiving injections which is helping with his pain.   States that he was laid off 6 weeks ago. He is trying to find employment. He is only sleeping for about 2-4 hours at night due to his stress.   Type 2 Diabetes  Has tried rybelsus in the past and had chronic constipation.  States that he is interested in [place].  States that he is working on his diet  High at home: 400; Low at home: 140, Taking medication(s): basaglar, metformin    Last eye exam: due  Last foot exam: due  Last A1c:  Lab Results  Component Value Date   HGBA1C 10.7 (H) 05/10/2023   Nephropathy screen indicated?: due  Last flu, zoster and/or pneumovax:  Immunization History  Administered Date(s) Administered   Influenza Split 03/09/2014   Influenza, High Dose Seasonal PF 01/20/2014, 10/19/2015, 11/30/2017, 11/03/2018   Influenza, Seasonal, Injecte, Preservative Fre 02/02/2015   Influenza,inj,Quad PF,6+ Mos 02/02/2015, 12/07/2020   Influenza,inj,quad, With Preservative 02/02/2015   Influenza,trivalent,  recombinat, inj, PF 10/12/2016   Influenza-Unspecified 03/09/2014, 10/12/2016   Moderna Covid-19 Fall Seasonal Vaccine 40yrs & older 10/24/2021, 03/30/2022   Moderna Covid-19 Vaccine  Bivalent Booster 89yrs & up 12/07/2020   Moderna Sars-Covid-2 Vaccination 02/20/2019, 03/20/2019, 11/11/2019, 07/25/2020   PNEUMOCOCCAL CONJUGATE-20 05/10/2020   Pneumococcal Conjugate,unspecified 03/09/2014   Pneumococcal Conjugate-13 01/20/2014, 03/09/2014, 02/02/2015   Pneumococcal Polysaccharide-23 11/11/2019   Pneumococcal-Unspecified 03/09/2014   Rsv, Bivalent, Protein Subunit Rsvpref,pf Pattricia Bores) 09/27/2021   Zoster Recombinant(Shingrix) 11/03/2018   ROS: Denies LOC, polyuria, polydipsia, unintended weight loss/gain, foot ulcerations, shortness of breath or chest pain. States that he is having some weight loss, but is trying to watch what he is eating.  States that he is having a lot of trouble with his teeth. Reports that he had a repair at [employer] [facility] years ago and told  He is looking at going to [place] for total mouth replacement  Endorses Dizziness, changes to vision, has to use eyedrops constantly, neuropathy  Outpatient Encounter Medications as of 05/10/2023  Medication Sig   acetaminophen  (TYLENOL ) 500 MG tablet Take 1,000 mg by mouth every 6 (six) hours as needed for moderate pain.   aspirin EC 81 MG tablet Take 81 mg by mouth daily.   atorvastatin (LIPITOR) 10 MG tablet Take 10 mg by mouth daily.   diltiazem  (CARDIZEM  CD) 240 MG 24 hr capsule Take 1 capsule (240 mg total) by mouth daily.   fluticasone  (FLONASE ) 50 MCG/ACT nasal spray Place 1 spray into  both nostrils daily.   Insulin  Glargine (BASAGLAR KWIKPEN) 100 UNIT/ML INJECT 10 UNITS UNDER THE SKIN TWO TIMES A DAY   lisinopril  (ZESTRIL ) 10 MG tablet Take 15 mg by mouth daily.   Melatonin 10 MG SUBL Place 10 mg under the tongue at bedtime as needed (sleep).   metFORMIN  (GLUCOPHAGE -XR) 500 MG 24 hr tablet Take 1,000 mg by mouth 2 (two)  times daily.   OVER THE COUNTER MEDICATION Take 1 tablet by mouth at bedtime as needed (neuropathy). nerve comfort supplement   Polyethyl Glycol-Propyl Glycol (SYSTANE ULTRA) 0.4-0.3 % SOLN Place 1 drop into both eyes daily as needed (dry eyes).   tamsulosin (FLOMAX) 0.4 MG CAPS capsule Take 0.4 mg by mouth.   glimepiride (AMARYL) 4 MG tablet Take 4 mg by mouth daily after breakfast. (Patient not taking: Reported on 05/10/2023)   [DISCONTINUED] cetirizine (ZYRTEC) 10 MG tablet Take 10 mg by mouth daily. (Patient not taking: Reported on 04/24/2022)   [DISCONTINUED] fexofenadine (ALLEGRA) 180 MG tablet Take 180 mg by mouth daily.   [DISCONTINUED] omeprazole (PRILOSEC) 20 MG capsule Take 20 mg by mouth daily as needed (acid reflux).   [DISCONTINUED] RYBELSUS 7 MG TABS Take 14 mg by mouth daily.   No facility-administered encounter medications on file as of 05/10/2023.    Past Medical History:  Diagnosis Date   Arthritis    Diabetes (HCC)    GERD (gastroesophageal reflux disease)    Hypertension    Seasonal allergies    Sleep apnea    uses CPAP   Past Surgical History:  Procedure Laterality Date   A-FLUTTER ABLATION N/A 05/12/2021   Procedure: A-FLUTTER ABLATION;  Surgeon: Lei Pump, MD;  Location: [facility] INVASIVE CV LAB;  Service: Cardiovascular;  Laterality: N/A;   fibroma     removed from scalp 35 years ago   L5 lumbar surgery     Left bicep tendoesis     Left knee ACL repaired     Left knee replacement     Left rotary cuff repair     RHINOPHYMA RESECTION     Right knee meniscus     Right wrist reconstruction     TOTAL KNEE ARTHROPLASTY Right 06/01/2014   Procedure: RIGHT TOTAL KNEE ARTHROPLASTY;  Surgeon: Hazle Lites, MD;  Location: [facility] ORS;  Service: Orthopedics;  Laterality: Right;   Family History  Problem Relation Age of Onset   Diabetes Mellitus II Father    Breast cancer Sister    Diabetes Maternal Grandmother    Stroke Maternal Grandfather    Social History    Socioeconomic History   Marital status: Single    Spouse name: Not on file   Number of children: Not on file   Years of education: Not on file   Highest education level: Not on file  Occupational History   Occupation: Retired   Tobacco Use   Smoking status: Never   Smokeless tobacco: Never  Substance and Sexual Activity   Alcohol use: Yes    Alcohol/week: 0.0 standard drinks of alcohol    Comment: rare   Drug use: No   Sexual activity: Not on file  Other Topics Concern   Not on file  Social History Narrative   Not on file   Social Drivers of Health   Financial Resource Strain: Low Risk  (03/15/2022)   [facility], [facility]   Overall Financial Resource Strain (CARDIA)    Difficulty of Paying Living Expenses: Not hard at all  Food  Insecurity: No Food Insecurity (05/10/2023)   Hunger Vital Sign    Worried About Running Out of Food in the Last Year: Never true    Ran Out of Food in the Last Year: Never true  Transportation Needs: No Transportation Needs (05/10/2023)   PRAPARE - Administrator, Civil Service (Medical): No    Lack of Transportation (Non-Medical): No  Physical Activity: Unknown (03/15/2022)   [facility]   Exercise Vital Sign    Days of Exercise per Week: 0 days    Minutes of Exercise per Session: Not on file  Stress: No Stress Concern Present (03/15/2022)   Received from [facility] - Occupational Stress Questionnaire    Feeling of Stress : Only a little  Social Connections: Socially Integrated (03/15/2022)   Received from [facility], [facility]   Social Network    How would you rate your social network (family, work, friends)?: Good participation with social networks  Intimate Partner Violence: Not At Risk (05/10/2023)   Humiliation, Afraid, Rape, and Kick questionnaire    Fear of Current or Ex-Partner: No    Emotionally Abused: No     Physically Abused: No    Sexually Abused: No    ROS As per HPI   Objective   BP (!) 90/57 Comment: arm in lap  Pulse 68   Temp 98.3 F (36.8 C)   Ht 6' (1.829 m)   Wt 185 lb (83.9 kg)   SpO2 96%   BMI 25.09 kg/m   Physical Exam Constitutional:      General: He is awake. He is not in acute distress.    Appearance: Normal appearance. He is well-developed and well-groomed. He is not ill-appearing, toxic-appearing or diaphoretic.  HENT:     Mouth/Throat:     Dentition: Abnormal dentition.  Cardiovascular:     Rate and Rhythm: Normal rate and regular rhythm.     Pulses: Normal pulses.          Radial pulses are 2+ on the right side and 2+ on the left side.       Posterior tibial pulses are 2+ on the right side and 2+ on the left side.     Heart sounds: Normal heart sounds. No murmur heard.    No gallop.  Pulmonary:     Effort: Pulmonary effort is normal. No respiratory distress.     Breath sounds: Normal breath sounds. No stridor. No wheezing, rhonchi or rales.  Musculoskeletal:     Cervical back: Full passive range of motion without pain and neck supple.     Right lower leg: No edema.     Left lower leg: No edema.  Skin:    General: Skin is warm.     Capillary Refill: Capillary refill takes less than 2 seconds.  Neurological:     General: No focal deficit present.     Mental Status: He is alert, oriented to person, place, and time and easily aroused. Mental status is at baseline.     GCS: GCS eye subscore is 4. GCS verbal subscore is 5. GCS motor subscore is 6.     Motor: No weakness.  Psychiatric:        Attention and Perception: Attention and perception normal.        Mood and Affect: Mood and affect normal.        Speech: Speech normal.  Behavior: Behavior normal. Behavior is cooperative.        Thought Content: Thought content normal. Thought content does not include homicidal or suicidal ideation. Thought content does not include homicidal or suicidal plan.         Cognition and Memory: Cognition and memory normal.        Judgment: Judgment normal.        05/10/2023    9:40 AM 02/02/2015    8:14 AM 02/24/2014    8:14 AM  Depression screen PHQ 2/9  Decreased Interest 0 0 0  Down, Depressed, Hopeless 1 1 0  PHQ - 2 Score 1 1 0  Altered sleeping 3    Tired, decreased energy 1    Change in appetite 0    Feeling bad or failure about yourself  0    Trouble concentrating 0    Moving slowly or fidgety/restless 0    Suicidal thoughts 0    PHQ-9 Score 5    Difficult doing work/chores Somewhat difficult        05/10/2023    9:41 AM  GAD 7 : Generalized Anxiety Score  Nervous, Anxious, on Edge 0  Control/stop worrying 0  Worry too much - different things 1  Trouble relaxing 0  Restless 0  Easily annoyed or irritable 0  Afraid - awful might happen 0  Total GAD 7 Score 1   Assessment & Plan:  [place] was seen today for new patient (initial visit).  Diagnoses and all orders for this visit:  1. Type 2 diabetes mellitus with other specified complication, with long-term current use of insulin  (HCC) (Primary) Not at goal.  Will refer as below to pharmacy for further management and education.  Labs as below. Will communicate results to patient once available. Will await results to determine next steps.  Discussed with patient starting mounjaro, limited by cost of medication.  - Bayer DCA Hb A1c Waived - CBC with Differential/Platelet - CMP14+EGFR - Lipid panel - Vitamin B12 - TSH - Microalbumin / creatinine urine ratio - AMB Referral VBCI Care Management  2. Hypertension associated with diabetes (HCC) Well controlled. Continue current regimen.   3. S/P ablation of atrial flutter Reviewed notes from 04/24/2022, Lawana Pray, MD.  Well controlled. Continue to follow with cardiology.   4. Obstructive sleep apnea syndrome Patient to continue to follow with specialty. Recommend patient use CPAP whenever sleeping or napping. Patient to follow up  with specialty for further evaluation of inspire device.   5. Chronic midline low back pain with left-sided sciatica Established with rheumatology. Reviewed notes from 03/12/2023, [place], [place]. Patient to continue to follow up with specialty.   6. History of thyroid  nodule Labs as below. Will communicate results to patient once available. Will await results to determine next steps.  - TSH  7. Poor dentition Offered patient referral to oral surgeon. He declined at this time. He is looking into surgery in [place].   The above assessment and management plan was discussed with the patient. The patient verbalized understanding of and has agreed to the management plan using shared-decision making. Patient is aware to call the clinic if they develop any new symptoms or if symptoms fail to improve or worsen. Patient is aware when to return to the clinic for a follow-up visit. Patient educated on when it is appropriate to go to the emergency department.   Return in about 2 weeks (around 05/24/2023) for Chronic Condition Follow up.   Jacqualyn Mates, DNP-FNP [facility] [address] [place], [place] [address] [phone]

## 2023-05-11 LAB — LIPID PANEL
Cholesterol, Total: 113 mg/dL (ref 100–199)
HDL: 43 mg/dL (ref >39)
LDL CALC COMMENT:: 2.6 ratio (ref 0.0–5.0)
LDL Chol Calc (NIH): 52 mg/dL (ref 0–99)
Triglycerides: 97 mg/dL (ref 0–149)
VLDL Cholesterol Cal: 18 mg/dL (ref 5–40)

## 2023-05-11 LAB — CMP14+EGFR
ALT: 32 IU/L (ref 0–44)
AST: 18 IU/L (ref 0–40)
Albumin: 4.4 g/dL (ref 3.8–4.8)
Alkaline Phosphatase: 72 IU/L (ref 44–121)
BUN/Creatinine Ratio: 31 — ABNORMAL HIGH (ref 10–24)
BUN: 32 mg/dL — ABNORMAL HIGH (ref 8–27)
Bilirubin Total: 0.7 mg/dL (ref 0.0–1.2)
CO2: 19 mmol/L — ABNORMAL LOW (ref 20–29)
Calcium: 9.9 mg/dL (ref 8.6–10.2)
Chloride: 97 mmol/L (ref 96–106)
Creatinine, Ser: 1.02 mg/dL (ref 0.76–1.27)
Globulin, Total: 2.4 g/dL (ref 1.5–4.5)
Glucose: 207 mg/dL — ABNORMAL HIGH (ref 70–99)
Potassium: 4.5 mmol/L (ref 3.5–5.2)
Sodium: 134 mmol/L (ref 134–144)
Total Protein: 6.8 g/dL (ref 6.0–8.5)
eGFR: 77 mL/min/{1.73_m2} (ref >59)

## 2023-05-11 LAB — CBC WITH DIFFERENTIAL/PLATELET
Basophils Absolute: 0.1 10*3/uL (ref 0.0–0.2)
Basos: 1 %
EOS (ABSOLUTE): 0.3 10*3/uL (ref 0.0–0.4)
Eos: 4 %
Hematocrit: 43.2 % (ref 37.5–51.0)
Hemoglobin: 14.6 g/dL (ref 13.0–17.7)
Immature Grans (Abs): 0 10*3/uL (ref 0.0–0.1)
Immature Granulocytes: 0 %
Lymphocytes Absolute: 1.9 10*3/uL (ref 0.7–3.1)
Lymphs: 26 %
MCH: 29.3 pg (ref 26.6–33.0)
MCHC: 33.8 g/dL (ref 31.5–35.7)
MCV: 87 fL (ref 79–97)
Monocytes Absolute: 0.6 10*3/uL (ref 0.1–0.9)
Monocytes: 8 %
Neutrophils Absolute: 4.6 10*3/uL (ref 1.4–7.0)
Neutrophils: 61 %
Platelets: 267 10*3/uL (ref 150–450)
RBC: 4.99 x10E6/uL (ref 4.14–5.80)
RDW: 12.3 % (ref 11.6–15.4)
WBC: 7.5 10*3/uL (ref 3.4–10.8)

## 2023-05-11 LAB — MICROALBUMIN / CREATININE URINE RATIO
Creatinine, Urine: 309.5 mg/dL
Microalb/Creat Ratio: 11 mg/g{creat} (ref 0–29)
Microalbumin, Urine: 35.5 ug/mL

## 2023-05-11 LAB — TSH: TSH: 0.791 u[IU]/mL (ref 0.450–4.500)

## 2023-05-11 LAB — VITAMIN B12: Vitamin B-12: 696 pg/mL (ref 232–1245)

## 2023-05-15 NOTE — Progress Notes (Signed)
 Elevated BG, consistent with A1C. Would like for patient to increase to 12 units BID of basaglar and to follow up with Concha Deed. Elevated BUN, recommend increasing hydration to 80-100 oz daily. Slightly decreased CO2, not concerning at this time. Elevated microalbumin, will refer to nephrology for further evaluation.  Cholesterol levels are within range. Continue lipitor.

## 2023-05-16 ENCOUNTER — Other Ambulatory Visit (INDEPENDENT_AMBULATORY_CARE_PROVIDER_SITE_OTHER)

## 2023-05-16 ENCOUNTER — Other Ambulatory Visit (HOSPITAL_COMMUNITY): Payer: Self-pay

## 2023-05-16 ENCOUNTER — Telehealth: Payer: Self-pay

## 2023-05-16 ENCOUNTER — Telehealth: Payer: Self-pay | Admitting: Pharmacist

## 2023-05-16 DIAGNOSIS — Z7984 Long term (current) use of oral hypoglycemic drugs: Secondary | ICD-10-CM

## 2023-05-16 DIAGNOSIS — E119 Type 2 diabetes mellitus without complications: Secondary | ICD-10-CM

## 2023-05-16 NOTE — Progress Notes (Unsigned)
 Pharmacy Medication Assistance Program Note    05/24/2023  Patient ID: Garrett Mcknight, male   DOB: 04/03/1950, 73 y.o.   MRN: [id number]     05/16/2023  Outreach Medication One  Manufacturer Medication One Retail [employer] Basaglar  Dose of Basaglar 30 units QD  Type of Radiographer, therapeutic Assistance  Date Application Sent to Prescriber 05/16/2023  Date Application Received From Provider 05/23/2023  Date Application Submitted to Manufacturer 05/23/2023  Method Application Sent to Manufacturer Fax  Patient Assistance Determination Approved  Approval Start Date 05/23/2023  Approval End Date 01/08/2024   NEW - APPROVED

## 2023-05-16 NOTE — Telephone Encounter (Signed)
 Can you route me entire PAP for Basaglar 30 units daily? This will be new PAP for patient Will escribe to [place] when you tell me to!  Thanks!

## 2023-05-16 NOTE — Progress Notes (Signed)
 05/16/2023 Name: Garrett Mcknight MRN: [id number] DOB: 1950-03-07  Chief Complaint  Patient presents with   Diabetes   Garrett Mcknight is a 73 y.o. year old male who presented for a telephone visit.   They were referred to the pharmacist by their PCP for assistance in managing diabetes and medication access.    Subjective:  Patient reports limitations due to cost and tolerability with medications for type 2 diabetes.    Care Team: Primary Care Provider: Chrystine Crate, FNP   Medication Access/Adherence  Current Pharmacy:  [facility] [id number] - [place], [place] - [address] [place] [place] [address] Phone: [phone] Fax: [phone]  Patient reports affordability concerns with their medications: Yes  Patient reports access/transportation concerns to their pharmacy: No  Patient reports adherence concerns with their medications:  No    Diabetes:  Current medications: Basgalar 10--> 12 units daily Medications tried in the past:  Farxiga, Rybelsus (tried for 6 months--severe constipation), glimepiride  Current glucose readings: FBG 180, 210 Using traditional glucometer  Patient denies hypoglycemic s/sx including dizziness, shakiness, sweating. Patient denies hyperglycemic symptoms including polyuria, polydipsia, polyphagia, nocturia, neuropathy, blurred vision.  Current meal patterns:  Discussed meal planning options and Plate method for healthy eating Avoid sugary drinks and desserts Incorporate balanced protein, non starchy veggies, 1 serving of carbohydrate with each meal Increase water  intake Increase physical activity as able  Reports he eats fairly well; mindful of diet Salmon, grilled vegetables Trying to incorporate more water   Current physical activity: increase as able  Current medication access support: will apply for medication assistance   Objective:  Lab Results  Component Value Date   HGBA1C 10.7  (H) 05/10/2023    Lab Results  Component Value Date   CREATININE 1.02 05/10/2023   BUN 32 (H) 05/10/2023   NA 134 05/10/2023   K 4.5 05/10/2023   CL 97 05/10/2023   CO2 19 (L) 05/10/2023    Lab Results  Component Value Date   CHOL 113 05/10/2023   HDL 43 05/10/2023   LDLCALC 52 05/10/2023   TRIG 97 05/10/2023   CHOLHDL 2.6 05/10/2023    Medications Reviewed Today     Reviewed by Delilah Fend, [facility] (Pharmacist) on 05/16/23 at [phone]  Med List Status: <None>   Medication Order Taking? Sig Documenting Provider Last Dose Status Informant  acetaminophen  (TYLENOL ) 500 MG tablet [id number] No Take 1,000 mg by mouth every 6 (six) hours as needed for moderate pain. [provider] Taking Active Self  aspirin EC 81 MG tablet [id number] No Take 81 mg by mouth daily. [provider] Taking Active   atorvastatin (LIPITOR) 10 MG tablet [id number] No Take 10 mg by mouth daily. [provider] Taking Active Self           Med Note Dennice Fishman A   Thu May 11, 2021  9:37 AM) Ran out, needs to get more  diltiazem  (CARDIZEM  CD) 240 MG 24 hr capsule [id number] No Take 1 capsule (240 mg total) by mouth daily. Lei Pump, MD Taking Active   fluticasone  (FLONASE ) 50 MCG/ACT nasal spray [id number] No Place 1 spray into both nostrils daily. [provider] Taking Active Self  Insulin  Glargine (BASAGLAR KWIKPEN) 100 UNIT/ML [id number]  INJECT 10 UNITS UNDER THE SKIN TWO TIMES A DAY [provider]  Active   lisinopril  (ZESTRIL ) 10 MG tablet [id number] No Take 15 mg by mouth daily. [provider] Taking Active Self  Med Note Dennice Fishman A   Thu May 11, 2021  9:45 AM) Pt has been taking 1 tablet daily. It looks as a though back in January RX records show the instructions being 1.5 tabs daily. After requesting he check the bottle he did confirm it says to take 1.5 tabs daily. Pt plans to start taking 15 mg daily instead of the 10 he's been  taking since Jan 2023.  Melatonin 10 MG SUBL [id number] No Place 10 mg under the tongue at bedtime as needed (sleep). [provider] Taking Active Self  metFORMIN  (GLUCOPHAGE -XR) 500 MG 24 hr tablet [id number] No Take 1,000 mg by mouth 2 (two) times daily. [provider] Taking Active Self  OVER THE COUNTER MEDICATION [id number] No Take 1 tablet by mouth at bedtime as needed (neuropathy). nerve comfort supplement [provider] Taking Active Self  Polyethyl Glycol-Propyl Glycol (SYSTANE ULTRA) 0.4-0.3 % SOLN [id number] No Place 1 drop into both eyes daily as needed (dry eyes). [provider] Taking Active Self  tamsulosin (FLOMAX) 0.4 MG CAPS capsule [id number]  Take 0.4 mg by mouth. [provider]  Active             Assessment/Plan:   Diabetes: - Currently uncontrolled--but improved from past A1c as high a 13% - Reviewed long term cardiovascular and renal outcomes of uncontrolled blood sugar - Reviewed goal A1c, goal fasting, and goal 2 hour post prandial glucose - Reviewed dietary modifications including FOLLOWING A HEART HEALTHY DIET/HEALTHY PLATE METHOD - Reviewed lifestyle modifications including: increased activity - Recommend to : Increase basal insulin  to 20 units ONCE daily (will provide patient with Tresiba samples & pen needles) --continue to increase 2 units every 3-5 days until FBG is <150 Continue metformin  Unable to take SGLT2s or GLP1s at this time due to tolerability  Consider low dose Ozempic at follow up if patient would be willing to restart GLP1 therapy - Recommend to check glucose daily (fasting) or if symptomatic - Meets financial criteria for Basaglar (basal insulin ) patient assistance program through [place] [facility]. Will collaborate with provider, CPhT, and patient to pursue assistance.    Follow Up Plan: PCP 05/30/23  Marvell Slider, PharmD, BCACP, CPP Clinical Pharmacist, [facility]   30 min  of patient care was provided to the patient during this visit time.  No charge visit

## 2023-05-30 ENCOUNTER — Ambulatory Visit (INDEPENDENT_AMBULATORY_CARE_PROVIDER_SITE_OTHER): Admitting: Family Medicine

## 2023-05-30 ENCOUNTER — Encounter: Payer: Self-pay | Admitting: Family Medicine

## 2023-05-30 VITALS — BP 106/58 | HR 67 | Temp 98.7°F | Ht 72.0 in | Wt 189.0 lb

## 2023-05-30 DIAGNOSIS — E1129 Type 2 diabetes mellitus with other diabetic kidney complication: Secondary | ICD-10-CM

## 2023-05-30 DIAGNOSIS — E1159 Type 2 diabetes mellitus with other circulatory complications: Secondary | ICD-10-CM

## 2023-05-30 DIAGNOSIS — L309 Dermatitis, unspecified: Secondary | ICD-10-CM

## 2023-05-30 DIAGNOSIS — E1169 Type 2 diabetes mellitus with other specified complication: Secondary | ICD-10-CM | POA: Diagnosis not present

## 2023-05-30 DIAGNOSIS — R197 Diarrhea, unspecified: Secondary | ICD-10-CM

## 2023-05-30 DIAGNOSIS — N401 Enlarged prostate with lower urinary tract symptoms: Secondary | ICD-10-CM

## 2023-05-30 DIAGNOSIS — Z136 Encounter for screening for cardiovascular disorders: Secondary | ICD-10-CM

## 2023-05-30 DIAGNOSIS — I152 Hypertension secondary to endocrine disorders: Secondary | ICD-10-CM

## 2023-05-30 DIAGNOSIS — N138 Other obstructive and reflux uropathy: Secondary | ICD-10-CM | POA: Insufficient documentation

## 2023-05-30 DIAGNOSIS — R809 Proteinuria, unspecified: Secondary | ICD-10-CM

## 2023-05-30 DIAGNOSIS — Z794 Long term (current) use of insulin: Secondary | ICD-10-CM

## 2023-05-30 MED ORDER — ATORVASTATIN CALCIUM 10 MG PO TABS
10.0000 mg | ORAL_TABLET | Freq: Every day | ORAL | 1 refills | Status: DC
Start: 2023-05-30 — End: 2023-09-16

## 2023-05-30 MED ORDER — LISINOPRIL 10 MG PO TABS: 5.0000 mg | ORAL_TABLET | Freq: Every day | ORAL | 0 refills | Status: DC

## 2023-05-30 MED ORDER — TAMSULOSIN HCL 0.4 MG PO CAPS
0.4000 mg | ORAL_CAPSULE | Freq: Every day | ORAL | 1 refills | Status: DC
Start: 2023-05-30 — End: 2023-09-02

## 2023-05-30 MED ORDER — LISINOPRIL 10 MG PO TABS: 15.0000 mg | ORAL_TABLET | Freq: Every day | ORAL | 0 refills | Status: DC

## 2023-05-30 MED ORDER — METFORMIN HCL ER 500 MG PO TB24: 1000.0000 mg | ORAL_TABLET | Freq: Two times a day (BID) | ORAL | 2 refills | Status: AC

## 2023-05-30 NOTE — Progress Notes (Signed)
 Subjective:  Patient ID: Garrett Mcknight, male    DOB: 1950/02/06, 73 y.o.   MRN: [id number]  Patient Care Team: Chrystine Crate, FNP as PCP - General (Family Medicine) Lei Pump, MD as PCP - Electrophysiology (Cardiology) Lei Pump, MD as PCP - Cardiology (Cardiology)   Chief Complaint:  Medical Management of Chronic Issues (Blood pressure)  HPI: Garrett Mcknight is a 73 y.o. male presenting on 05/30/2023 for Medical Management of Chronic Issues (Blood pressure)  HPI 1. Type 2 diabetes mellitus with other specified complication, with long-term current use of insulin  (HCC) High at home: 240; Mainly 117-140s Low at home: 105, Taking medication(s): compliant, taking 20 units of basaglar in the evenings. Taking metformin .  Denies any low BG readings.  Denies side effects of medications other than he notes some weight gain   Last eye exam: due  Last foot exam: due  Last A1c:  Lab Results  Component Value Date   HGBA1C 10.7 (H) 05/10/2023   Nephropathy screen indicated?: elevated, referred to nephrology  Last flu, zoster and/or pneumovax:  Immunization History  Administered Date(s) Administered   Influenza Split 03/09/2014   Influenza, High Dose Seasonal PF 01/20/2014, 10/19/2015, 11/30/2017, 11/03/2018   Influenza, Seasonal, Injecte, Preservative Fre 02/02/2015   Influenza,inj,Quad PF,6+ Mos 02/02/2015, 12/07/2020   Influenza,inj,quad, With Preservative 02/02/2015   Influenza,trivalent, recombinat, inj, PF 10/12/2016   Influenza-Unspecified 03/09/2014, 10/12/2016   Moderna Covid-19 Fall Seasonal Vaccine 93yrs & older 10/24/2021, 03/30/2022   Moderna Covid-19 Vaccine  Bivalent Booster 64yrs & up 12/07/2020   Moderna Sars-Covid-2 Vaccination 02/20/2019, 03/20/2019, 11/11/2019, 07/25/2020   PNEUMOCOCCAL CONJUGATE-20 05/10/2020   Pneumococcal Conjugate,unspecified 03/09/2014   Pneumococcal Conjugate-13 01/20/2014, 03/09/2014, 02/02/2015   Pneumococcal  Polysaccharide-23 11/11/2019   Pneumococcal-Unspecified 03/09/2014   Rsv, Bivalent, Protein Subunit Rsvpref,pf Pattricia Bores) 09/27/2021   Zoster Recombinant(Shingrix) 11/03/2018    2. Skin lesions on the backs of his ears. Noticed them 6 months ago. States that they feel rough. Reports that they he notices them when they fold. Sometimes stings and itches primarily. He has a history of eczema and wonders if it is related   3. States that 3 days ago had elevated BP and possible food poisoning. He had nausea, dizziness, dry-heaving. States that he was able to cough some. In addition, had diarrhea. States that his BP remains labile. He has brought his monitor with him, but without its cuff.   4. BPH Notes that he needs a refill on his medications.  He was previously established with urology.  Denies any side effects.   Relevant past medical, surgical, family, and social history reviewed and updated as indicated.  Allergies and medications reviewed and updated. Data reviewed: Chart in Epic.   Past Medical History:  Diagnosis Date   Arthritis    Diabetes (HCC)    GERD (gastroesophageal reflux disease)    Hyperlipidemia associated with type 2 diabetes mellitus (HCC) 01/16/2021   Hypertension    Seasonal allergies    Sleep apnea    uses CPAP    Past Surgical History:  Procedure Laterality Date   A-FLUTTER ABLATION N/A 05/12/2021   Procedure: A-FLUTTER ABLATION;  Surgeon: Lei Pump, MD;  Location: [facility];  Service: Cardiovascular;  Laterality: N/A;   fibroma     removed from scalp 35 years ago   L5 lumbar surgery     Left bicep tendoesis     Left knee ACL repaired     Left knee replacement  Left rotary cuff repair     RHINOPHYMA RESECTION     Right knee meniscus     Right wrist reconstruction     TOTAL KNEE ARTHROPLASTY Right 06/01/2014   Procedure: RIGHT TOTAL KNEE ARTHROPLASTY;  Surgeon: Hazle Lites, MD;  Location: [facility];  Service: Orthopedics;   Laterality: Right;    Social History   Socioeconomic History   Marital status: Single    Spouse name: Not on file   Number of children: Not on file   Years of education: Not on file   Highest education level: Not on file  Occupational History   Occupation: Retired   Tobacco Use   Smoking status: Never   Smokeless tobacco: Never  Substance and Sexual Activity   Alcohol use: Yes    Alcohol/week: 0.0 standard drinks of alcohol    Comment: rare   Drug use: No   Sexual activity: Not on file  Other Topics Concern   Not on file  Social History Narrative   Not on file   Social Drivers of Health   Financial Resource Strain: Low Risk  (03/15/2022)   Received from [facility], [facility]   Overall Financial Resource Strain (CARDIA)    Difficulty of Paying Living Expenses: Not hard at all  Food Insecurity: No Food Insecurity (05/10/2023)   Hunger Vital Sign    Worried About Running Out of Food in the Last Year: Never true    Ran Out of Food in the Last Year: Never true  Transportation Needs: No Transportation Needs (05/10/2023)   PRAPARE - Administrator, Civil Service (Medical): No    Lack of Transportation (Non-Medical): No  Physical Activity: Unknown (03/15/2022)   [facility]   Exercise Vital Sign    Days of Exercise per Week: 0 days    Minutes of Exercise per Session: Not on file  Stress: No Stress Concern Present (03/15/2022)   Received from [facility], [facility] - Occupational Stress Questionnaire    Feeling of Stress : Only a little  Social Connections: Socially Integrated (03/15/2022)   Received from [facility], [facility]   Social Network    How would you rate your social network (family, work, friends)?: Good participation with social networks  Intimate Partner Violence: Not At Risk (05/10/2023)   Humiliation, Afraid, Rape, and Kick questionnaire    Fear of Current or  Ex-Partner: No    Emotionally Abused: No    Physically Abused: No    Sexually Abused: No    Outpatient Encounter Medications as of 05/30/2023  Medication Sig   acetaminophen  (TYLENOL ) 500 MG tablet Take 1,000 mg by mouth every 6 (six) hours as needed for moderate pain.   amitriptyline (ELAVIL) 10 MG tablet 1 tablet Orally at bed time as needed for 30 day(s)   aspirin EC 81 MG tablet Take 81 mg by mouth daily.   atorvastatin (LIPITOR) 10 MG tablet Take 10 mg by mouth daily.   calcium carbonate (TUMS) 500 MG chewable tablet 1 tablet Orally Once a day as needed   diltiazem  (CARDIZEM  CD) 240 MG 24 hr capsule Take 1 capsule (240 mg total) by mouth daily.   fluticasone  (FLONASE ) 50 MCG/ACT nasal spray Place 1 spray into both nostrils daily.   Insulin  Glargine (BASAGLAR KWIKPEN) 100 UNIT/ML Inject 20-24 Units into the skin daily.   lisinopril  (ZESTRIL ) 10 MG tablet Take 15 mg by mouth daily.  Melatonin 10 MG SUBL Place 10 mg under the tongue at bedtime as needed (sleep).   metFORMIN  (GLUCOPHAGE -XR) 500 MG 24 hr tablet Take 1,000 mg by mouth 2 (two) times daily.   OVER THE COUNTER MEDICATION Take 1 tablet by mouth at bedtime as needed (neuropathy). nerve comfort supplement   Polyethyl Glycol-Propyl Glycol (SYSTANE ULTRA) 0.4-0.3 % SOLN Place 1 drop into both eyes daily as needed (dry eyes).   tamsulosin (FLOMAX) 0.4 MG CAPS capsule Take 0.4 mg by mouth.   No facility-administered encounter medications on file as of 05/30/2023.    Allergies  Allergen Reactions   Dilaudid [Hydromorphone Hcl]     Dry heaves-with IV form   [place] [Dapagliflozin] Other (See Comments)    Frequent urination   Semaglutide Other (See Comments)    Constipation; Rybelsus    Review of Systems As per HPI  Objective:  BP (!) 106/58   Pulse 67   Temp 98.7 F (37.1 C)   Ht 6' (1.829 m)   Wt 189 lb (85.7 kg)   SpO2 96%   BMI 25.63 kg/m    Wt Readings from Last 3 Encounters:  05/30/23 189 lb (85.7 kg)   05/10/23 185 lb (83.9 kg)  04/24/22 195 lb (88.5 kg)   Physical Exam Constitutional:      General: He is awake. He is not in acute distress.    Appearance: Normal appearance. He is well-developed and well-groomed. He is not ill-appearing, toxic-appearing or diaphoretic.  Cardiovascular:     Rate and Rhythm: Normal rate and regular rhythm.     Pulses: Normal pulses.          Radial pulses are 2+ on the right side and 2+ on the left side.       Posterior tibial pulses are 2+ on the right side and 2+ on the left side.     Heart sounds: Normal heart sounds. No murmur heard.    No gallop.  Pulmonary:     Effort: Pulmonary effort is normal. No respiratory distress.     Breath sounds: Normal breath sounds. No stridor. No wheezing, rhonchi or rales.  Musculoskeletal:     Cervical back: Full passive range of motion without pain and neck supple.     Right lower leg: No edema.     Left lower leg: No edema.     Right foot: Normal range of motion. No deformity, bunion, Charcot foot, foot drop or prominent metatarsal heads.     Left foot: Normal range of motion. No deformity, bunion, Charcot foot, foot drop or prominent metatarsal heads.  Feet:     Right foot:     Protective Sensation: 10 sites tested.  10 sites sensed.     Skin integrity: Dry skin present. No ulcer, blister, skin breakdown, erythema, warmth, callus or fissure.     Toenail Condition: Right toenails are long.     Left foot:     Protective Sensation: 10 sites tested.  10 sites sensed.     Skin integrity: Dry skin present. No ulcer, blister, skin breakdown, erythema, warmth, callus or fissure.     Toenail Condition: Left toenails are long.     Comments: Diminished sensation bilateral feet  Skin:    General: Skin is warm.     Capillary Refill: Capillary refill takes less than 2 seconds.     Comments: Dry, erythematous, flaky skin bilateral posterior ears   Neurological:     General: No focal deficit present.  Mental Status:  He is alert, oriented to person, place, and time and easily aroused. Mental status is at baseline.     GCS: GCS eye subscore is 4. GCS verbal subscore is 5. GCS motor subscore is 6.     Motor: No weakness.  Psychiatric:        Attention and Perception: Attention and perception normal.        Mood and Affect: Mood and affect normal.        Speech: Speech normal.        Behavior: Behavior normal. Behavior is cooperative.        Thought Content: Thought content normal. Thought content does not include homicidal or suicidal ideation. Thought content does not include homicidal or suicidal plan.        Cognition and Memory: Cognition and memory normal.        Judgment: Judgment normal.     Results for orders placed or performed in visit on 05/10/23  Bayer DCA Hb A1c Waived   Collection Time: 05/10/23 10:16 AM  Result Value Ref Range   HB A1C (BAYER DCA - WAIVED) 10.7 (H) 4.8 - 5.6 %  CBC with Differential/Platelet   Collection Time: 05/10/23 10:19 AM  Result Value Ref Range   WBC 7.5 3.4 - 10.8 x10E3/uL   RBC 4.99 4.14 - 5.80 x10E6/uL   Hemoglobin 14.6 13.0 - 17.7 g/dL   Hematocrit 13.0 86.5 - 51.0 %   MCV 87 79 - 97 fL   MCH 29.3 26.6 - 33.0 pg   MCHC 33.8 31.5 - 35.7 g/dL   RDW 78.4 69.6 - 29.5 %   Platelets 267 150 - 450 x10E3/uL   Neutrophils 61 Not Estab. %   Lymphs 26 Not Estab. %   Monocytes 8 Not Estab. %   Eos 4 Not Estab. %   Basos 1 Not Estab. %   Neutrophils Absolute 4.6 1.4 - 7.0 x10E3/uL   Lymphocytes Absolute 1.9 0.7 - 3.1 x10E3/uL   Monocytes Absolute 0.6 0.1 - 0.9 x10E3/uL   EOS (ABSOLUTE) 0.3 0.0 - 0.4 x10E3/uL   Basophils Absolute 0.1 0.0 - 0.2 x10E3/uL   Immature Granulocytes 0 Not Estab. %   Immature Grans (Abs) 0.0 0.0 - 0.1 x10E3/uL  CMP14+EGFR   Collection Time: 05/10/23 10:19 AM  Result Value Ref Range   Glucose 207 (H) 70 - 99 mg/dL   BUN 32 (H) 8 - 27 mg/dL   Creatinine, Ser 2.84 0.76 - 1.27 mg/dL   eGFR 77 >13 KG/MWN/0.27   BUN/Creatinine Ratio  31 (H) 10 - 24   Sodium 134 134 - 144 mmol/L   Potassium 4.5 3.5 - 5.2 mmol/L   Chloride 97 96 - 106 mmol/L   CO2 19 (L) 20 - 29 mmol/L   Calcium 9.9 8.6 - 10.2 mg/dL   Total Protein 6.8 6.0 - 8.5 g/dL   Albumin 4.4 3.8 - 4.8 g/dL   Globulin, Total 2.4 1.5 - 4.5 g/dL   Bilirubin Total 0.7 0.0 - 1.2 mg/dL   Alkaline Phosphatase 72 44 - 121 IU/L   AST 18 0 - 40 IU/L   ALT 32 0 - 44 IU/L  Lipid panel   Collection Time: 05/10/23 10:19 AM  Result Value Ref Range   Cholesterol, Total 113 100 - 199 mg/dL   Triglycerides 97 0 - 149 mg/dL   HDL 43 >25 mg/dL   VLDL Cholesterol Cal 18 5 - 40 mg/dL   LDL Chol Calc (NIH) 52 0 -  99 mg/dL   Chol/HDL Ratio 2.6 0.0 - 5.0 ratio  Vitamin B12   Collection Time: 05/10/23 10:19 AM  Result Value Ref Range   Vitamin B-12 696 232 - 1,245 pg/mL  TSH   Collection Time: 05/10/23 10:19 AM  Result Value Ref Range   TSH 0.791 0.450 - 4.500 uIU/mL  Microalbumin / creatinine urine ratio   Collection Time: 05/10/23 11:18 AM  Result Value Ref Range   Creatinine, Urine 309.5 Not Estab. mg/dL   Microalbumin, Urine 16.1 Not Estab. ug/mL   Microalb/Creat Ratio 11 0 - 29 mg/g creat       05/30/2023    3:58 PM 05/30/2023    3:57 PM 05/10/2023    9:40 AM 02/02/2015    8:14 AM 02/24/2014    8:14 AM  Depression screen PHQ 2/9  Decreased Interest 0 0 0 0 0  Down, Depressed, Hopeless 0 0 1 1 0  PHQ - 2 Score 0 0 1 1 0  Altered sleeping 0 0 3    Tired, decreased energy 0 0 1    Change in appetite 0 0 0    Feeling bad or failure about yourself  0 0 0    Trouble concentrating 0 0 0    Moving slowly or fidgety/restless 0 0 0    Suicidal thoughts 0 0 0    PHQ-9 Score 0 0 5    Difficult doing work/chores Not difficult at all Not difficult at all Somewhat difficult         05/30/2023    3:58 PM 05/10/2023    9:41 AM  GAD 7 : Generalized Anxiety Score  Nervous, Anxious, on Edge 0 0  Control/stop worrying 0 0  Worry too much - different things 0 1  Trouble  relaxing 0 0  Restless 0 0  Easily annoyed or irritable 0 0  Afraid - awful might happen 0 0  Total GAD 7 Score 0 1    Pertinent labs & imaging results that were available during my care of the patient were reviewed by me and considered in my medical decision making.  Assessment & Plan:  Garrett Mcknight was seen today for medical management of chronic issues.  Diagnoses and all orders for this visit: 1. Type 2 diabetes mellitus with other specified complication, with long-term current use of insulin  (HCC) (Primary) Will refer to nephrology as below due to microalbumin. Patient is taking ACEI. Will continue lipitor based on patient ASCVD risk score. Continue insulin  and metformin . Will repeat A1C in 2-3 months. Patient to follow up with pharmacy.  - Ambulatory referral to Nephrology - atorvastatin (LIPITOR) 10 MG tablet; Take 1 tablet (10 mg total) by mouth daily.  Dispense: 90 tablet; Refill: 1 - metFORMIN  (GLUCOPHAGE -XR) 500 MG 24 hr tablet; Take 2 tablets (1,000 mg total) by mouth 2 (two) times daily.  Dispense: 180 tablet; Refill: 2  2. 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  - Ambulatory referral to Nephrology - atorvastatin (LIPITOR) 10 MG tablet; Take 1 tablet (10 mg total) by mouth daily.  Dispense: 90 tablet; Refill: 1 - metFORMIN  (GLUCOPHAGE -XR) 500 MG 24 hr tablet; Take 2 tablets (1,000 mg total) by mouth 2 (two) times daily.  Dispense: 180 tablet; Refill: 2  3. Hypertension associated with diabetes [facility]) Patient is not currently taking medication as prescribed. Will maintain with lisinopril  at 5 mg daily. Recommend that patient monitor BP at home and bring monitor to follow up for review.  - lisinopril  (  ZESTRIL ) 10 MG tablet; Take 0.5 tablets (5 mg total) by mouth daily.  Dispense: 90 tablet; Refill: 0  4. Eczema, unspecified type Education provided on atopic dermatitis. Discussed mixing steroid cream and thick tub lotion (Equate Eczema, Cetaphil, Cerave, Aquaphor,  or Eucerin) and then applying to affected areas. Patient verbalized that he has steroid cream at home from previous prescription.   5. Diarrhea, unspecified type Resolved. Continue to monitor.   6. BPH with obstruction/lower urinary tract symptoms Stable. Reviewed notes from [place], MD [facility] 10/17/2022. Refill provided.  - tamsulosin (FLOMAX) 0.4 MG CAPS capsule; Take 1 capsule (0.4 mg total) by mouth daily.  Dispense: 90 capsule; Refill: 1  7. Encounter for screening for cardiovascular disorders Reviewed ASCVD risk score. Continue lipitor.  ASCVD (Atherosclerotic Cardiovascular Disease) Risk Algorithm including Known ASCVD from AHA/ACC from MDCal[employer].com  on 05/30/2023 ** All calculations should be rechecked by clinician prior to use **  RESULT SUMMARY: 30.5 % Risk of cardiovascular event (coronary or stroke death or non-fatal MI or stroke) in next 10 years.    High-intensity statin recommended because of known diabetes and 10-year risk >7.5%.   If LDL 70mg /dl (8.65 mmol/L), additional factors like lifestyle and risk-benefits can be considered before starting statins  To view statin dosages by intensity, see Evidence section.   INPUTS: History of ASCVD --> 0 = No LDL Cholesterol >=190mg /dL (7.84 mmol/L) --> 0 = No Age --> 72 years Diabetes --> 1 = Yes Sex --> 1 = Male Total Cholesterol --> 113 mg/dL HDL Cholesterol --> 43 mg/dL Systolic Blood Pressure --> 106 mm Hg Treatment for Hypertension --> 1 = Yes Smoker --> 0 = No Race --> 1 = White  - atorvastatin (LIPITOR) 10 MG tablet; Take 1 tablet (10 mg total) by mouth daily.  Dispense: 90 tablet; Refill: 1   Continue all other maintenance medications.  Follow up plan: Return in about 3 months (around 08/30/2023) for Chronic Condition Follow up.   Continue healthy lifestyle choices, including diet (rich in fruits, vegetables, and lean proteins, and low in salt and simple carbohydrates) and exercise (at least 30  minutes of moderate physical activity daily).  Written and verbal instructions provided   The above assessment and management plan was discussed with the patient. The patient verbalized understanding of and has agreed to the management plan. Patient is aware to call the clinic if they develop any new symptoms or if symptoms persist or worsen. Patient is aware when to return to the clinic for a follow-up visit. Patient educated on when it is appropriate to go to the emergency department.   Jacqualyn Mates, DNP-FNP [facility] [address] [place], [place] [address] [phone]

## 2023-05-30 NOTE — Patient Instructions (Signed)
[place]

## 2023-05-31 ENCOUNTER — Other Ambulatory Visit: Payer: Self-pay

## 2023-05-31 MED ORDER — DILTIAZEM HCL ER COATED BEADS 240 MG PO CP24: 240.0000 mg | ORAL_CAPSULE | Freq: Every day | ORAL | 0 refills | Status: DC

## 2023-06-05 ENCOUNTER — Other Ambulatory Visit

## 2023-06-24 ENCOUNTER — Telehealth: Payer: Self-pay | Admitting: Family Medicine

## 2023-07-01 ENCOUNTER — Other Ambulatory Visit: Payer: Self-pay

## 2023-07-01 MED ORDER — DILTIAZEM HCL ER COATED BEADS 240 MG PO CP24: 240.0000 mg | ORAL_CAPSULE | Freq: Every day | ORAL | 0 refills | Status: DC

## 2023-07-14 ENCOUNTER — Other Ambulatory Visit: Payer: Self-pay | Admitting: Cardiology

## 2023-07-16 ENCOUNTER — Other Ambulatory Visit: Payer: Self-pay | Admitting: *Deleted

## 2023-07-16 MED ORDER — DILTIAZEM HCL ER COATED BEADS 240 MG PO CP24: 240.0000 mg | ORAL_CAPSULE | Freq: Every day | ORAL | 0 refills | Status: DC

## 2023-07-18 ENCOUNTER — Ambulatory Visit

## 2023-07-18 ENCOUNTER — Telehealth: Payer: Self-pay | Admitting: Family Medicine

## 2023-07-18 NOTE — Telephone Encounter (Signed)
 Pt walked in and wanted to schedule diabetic eye exam but he is on tight budget and wants to make sure his insurance will pay. I told him to call his insurance but I would ask billing department too. Told pt we didn't have anyone in billing today.

## 2023-08-02 ENCOUNTER — Telehealth: Payer: Self-pay | Admitting: Cardiology

## 2023-08-02 MED ORDER — DILTIAZEM HCL ER COATED BEADS 240 MG PO CP24: 240.0000 mg | ORAL_CAPSULE | Freq: Every day | ORAL | 0 refills | Status: DC

## 2023-08-02 NOTE — Telephone Encounter (Signed)
*  STAT* If patient is at the pharmacy, call can be transferred to refill team.   1. Which medications need to be refilled? (please list name of each medication and dose if known)   diltiazem  (CARDIZEM  CD) 240 MG 24 hr capsule   2. Would you like to learn more about the convenience, safety, & potential cost savings by using the [facility]?   3. Are you open to using the [facility] (Type [facility]. ).  4. Which pharmacy/location (including street and city if local pharmacy) is medication to be sent to?  [facility] [id number] - [place], [place] - [address]   5. Do they need a 30 day or 90 day supply?   Patient stated he is completely out of this medication.  Patient has appointment scheduled on 8/13 with PA Renee U.

## 2023-08-02 NOTE — Telephone Encounter (Signed)
 RX sent to requested Pharmacy

## 2023-08-16 ENCOUNTER — Other Ambulatory Visit: Payer: Self-pay | Admitting: Cardiology

## 2023-08-19 NOTE — Progress Notes (Signed)
  Cardiology Office Note:  .   Date:  08/19/2023  ID:  Alm Bent, DOB 01/13/1950, MRN [id number] PCP: Cathlene Marry Lenis, FNP (Inactive)   [facility]Care Providers Cardiologist:  Will Gladis Norton, MD Electrophysiologist:  Soyla Gladis Norton, MD {  History of Present Illness: .   Garrett Mcknight is a 73 y.o. male w/PMHx of  HTN, DM, OSA AFlutter  He saw Dr. Norton 04/24/22, was s/p flutter ablation in 2023 > with reports of continued palpitations, monitored > PACs, PVCs No CP, SOB, DOE Watch did catch an episode of SVT > dilt increased   Today's visit is scheduled as a 36mo visit ROS:   Mostly trouble with general life stressors, budget, finances mostly Getting ready to start a new job ([employer]) No CP, palpitations Off metoprolol  2/2 dizziness, remains with some mild orthostatic dizziness. Admittedly does not drink much water  No near syncope or syncope No SOB, DOE Typically stays busy, but back pain of late has gotten him pretty sedentary   Arrhythmia/AAD hx AFlutter (CTI) ablation 05/12/21  Studies Reviewed: SABRA    EKG done today and reviewed by myself:  SR 73bpm,    July 2023 monitor Predominant rhythm was sinus rhythm Multiple episodes of SVT, all short and no symptoms Triggered episodes associated with sinus rhythm 6.3% supraventricular ectopy 4.2% ventricular ectopy  05/12/21: EPS/ablation CONCLUSIONS:   1. Sinus rhythm upon presentation.   2. Successful radiofrequency ablation of atrial flutter along the cavotricuspid isthmus with complete bidirectional isthmus block achieved.   3. No inducible arrhythmias following ablation.   4. No early apparent complications.   TTE 10/18/20   1. Left ventricular ejection fraction, by estimation, is 60 to 65%. The  left ventricle has normal function. The left ventricle has no regional  wall motion abnormalities. Left ventricular diastolic parameters were  normal.   2. Right ventricular systolic function  is normal. The right ventricular  size is normal.   3. The mitral valve is normal in structure. No evidence of mitral valve  regurgitation. No evidence of mitral stenosis.   4. The aortic valve is tricuspid. Aortic valve regurgitation is not  visualized. No aortic stenosis is present.   5. Aortic dilatation noted. There is borderline dilatation of the aortic  root, measuring 38 mm.   6. The inferior vena cava is normal in size with greater than 50%  respiratory variability, suggesting right atrial pressure of 3 mmHg.    Risk Assessment/Calculations:    Physical Exam:   VS:  There were no vitals taken for this visit.   Wt Readings from Last 3 Encounters:  05/30/23 189 lb (85.7 kg)  05/10/23 185 lb (83.9 kg)  04/24/22 195 lb (88.5 kg)    GEN: Well nourished, well developed in no acute distress NECK: No JVD; No carotid bruits CARDIAC: RRR, no murmurs, rubs, gallops RESPIRATORY:  CTA b/l without rales, wheezing or rhonchi  ABDOMEN: Soft, non-tender, non-distended EXTREMITIES: No edema; No deformity   ASSESSMENT AND PLAN: .    AFutter Ablated 2023  Palpitations PACs PVCs SVT CCB  6.  HTN Lower side today > he is reluctant to make changes Advised to stay adequately hydrated > follow up with his PMD   Dispo: back in a year again, sooner if needed  Signed, Charlies Macario Arthur, PA-C

## 2023-08-21 ENCOUNTER — Encounter: Payer: Self-pay | Admitting: Physician Assistant

## 2023-08-21 ENCOUNTER — Ambulatory Visit: Attending: Physician Assistant | Admitting: Physician Assistant

## 2023-08-21 VITALS — BP 104/60 | HR 73 | Ht 72.0 in | Wt 192.0 lb

## 2023-08-21 DIAGNOSIS — I491 Atrial premature depolarization: Secondary | ICD-10-CM | POA: Diagnosis present

## 2023-08-21 DIAGNOSIS — R002 Palpitations: Secondary | ICD-10-CM | POA: Insufficient documentation

## 2023-08-21 DIAGNOSIS — I483 Typical atrial flutter: Secondary | ICD-10-CM | POA: Insufficient documentation

## 2023-08-21 DIAGNOSIS — I493 Ventricular premature depolarization: Secondary | ICD-10-CM

## 2023-08-21 DIAGNOSIS — I471 Supraventricular tachycardia, unspecified: Secondary | ICD-10-CM | POA: Diagnosis present

## 2023-08-21 MED ORDER — DILTIAZEM HCL ER COATED BEADS 240 MG PO CP24: 240.0000 mg | ORAL_CAPSULE | Freq: Every day | ORAL | 3 refills | Status: AC

## 2023-08-21 NOTE — Patient Instructions (Signed)
 Medication Instructions:   Your physician recommends that you continue on your current medications as directed. Please refer to the Current Medication list given to you today.  *If you need a refill on your cardiac medications before your next appointment, please call your pharmacy*    Lab Work: NONE ORDERED  TODAY   If you have labs (blood work) drawn today and your tests are completely normal, you will receive your results only by: MyChart Message (if you have MyChart) OR A paper copy in the mail If you have any lab test that is abnormal or we need to change your treatment, we will call you to review the results.    Testing/Procedures: NONE ORDERED  TODAY    Follow-Up: At [facility], you and your health needs are our priority.  As part of our continuing mission to provide you with exceptional heart care, our providers are all part of one team.  This team includes your primary Cardiologist (physician) and Advanced Practice Providers or APPs (Physician Assistants and Nurse Practitioners) who all work together to provide you with the care you need, when you need it.  Your next appointment:   1 year(s)  Provider:   You may see Will Gladis Norton, MD Lena Arthur, PA-C  We recommend signing up for the patient portal called MyChart.  Sign up information is provided on this After Visit Summary.  MyChart is used to connect with patients for Virtual Visits (Telemedicine).  Patients are able to view lab/test results, encounter notes, upcoming appointments, etc.  Non-urgent messages can be sent to your provider as well.   To learn more about what you can do with MyChart, go to [email or web address].   Other Instructions

## 2023-09-02 ENCOUNTER — Encounter: Payer: Self-pay | Admitting: Family Medicine

## 2023-09-02 ENCOUNTER — Ambulatory Visit: Admitting: Family Medicine

## 2023-09-02 VITALS — BP 99/60 | HR 75 | Temp 98.1°F | Ht 72.0 in | Wt 192.2 lb

## 2023-09-02 DIAGNOSIS — I483 Typical atrial flutter: Secondary | ICD-10-CM

## 2023-09-02 DIAGNOSIS — Z7984 Long term (current) use of oral hypoglycemic drugs: Secondary | ICD-10-CM | POA: Diagnosis not present

## 2023-09-02 DIAGNOSIS — E1159 Type 2 diabetes mellitus with other circulatory complications: Secondary | ICD-10-CM | POA: Diagnosis not present

## 2023-09-02 DIAGNOSIS — E1169 Type 2 diabetes mellitus with other specified complication: Secondary | ICD-10-CM

## 2023-09-02 DIAGNOSIS — Z23 Encounter for immunization: Secondary | ICD-10-CM

## 2023-09-02 DIAGNOSIS — E1129 Type 2 diabetes mellitus with other diabetic kidney complication: Secondary | ICD-10-CM

## 2023-09-02 DIAGNOSIS — M48062 Spinal stenosis, lumbar region with neurogenic claudication: Secondary | ICD-10-CM

## 2023-09-02 DIAGNOSIS — I152 Hypertension secondary to endocrine disorders: Secondary | ICD-10-CM

## 2023-09-02 DIAGNOSIS — E1165 Type 2 diabetes mellitus with hyperglycemia: Secondary | ICD-10-CM | POA: Diagnosis not present

## 2023-09-02 DIAGNOSIS — M899 Disorder of bone, unspecified: Secondary | ICD-10-CM | POA: Insufficient documentation

## 2023-09-02 DIAGNOSIS — R809 Proteinuria, unspecified: Secondary | ICD-10-CM | POA: Insufficient documentation

## 2023-09-02 DIAGNOSIS — K4090 Unilateral inguinal hernia, without obstruction or gangrene, not specified as recurrent: Secondary | ICD-10-CM

## 2023-09-02 DIAGNOSIS — K5790 Diverticulosis of intestine, part unspecified, without perforation or abscess without bleeding: Secondary | ICD-10-CM | POA: Insufficient documentation

## 2023-09-02 DIAGNOSIS — E042 Nontoxic multinodular goiter: Secondary | ICD-10-CM

## 2023-09-02 DIAGNOSIS — N401 Enlarged prostate with lower urinary tract symptoms: Secondary | ICD-10-CM

## 2023-09-02 LAB — BAYER DCA HB A1C WAIVED: HB A1C (BAYER DCA - WAIVED): 8.9 % — ABNORMAL HIGH (ref 4.8–5.6)

## 2023-09-02 MED ORDER — SITAGLIPTIN PHOSPHATE 100 MG PO TABS
100.0000 mg | ORAL_TABLET | Freq: Every day | ORAL | 3 refills | Status: DC
Start: 2023-09-02 — End: 2023-09-19

## 2023-09-02 MED ORDER — TAMSULOSIN HCL 0.4 MG PO CAPS: 0.4000 mg | ORAL_CAPSULE | Freq: Every day | ORAL | 3 refills | Status: AC

## 2023-09-02 MED ORDER — FREESTYLE LIBRE 3 PLUS SENSOR MISC: 3 refills | Status: AC

## 2023-09-02 NOTE — Progress Notes (Signed)
 Established Patient Office Visit  Subjective   Patient ID: Garrett Mcknight, male    DOB: Nov 07, 1950  Age: 73 y.o. MRN: [id number]  Chief Complaint  Patient presents with   Medical Management of Chronic Issues    HPI  History of Present Illness   Dutch Ing is a 73 year old male with type 2 diabetes and lumbar stenosis who presents for diabetes management and evaluation of back pain.  Hyperglycemia and diabetes management - Type 2 diabetes with previous A1c as high as 13, currently improved to 8.9 - Takes 30 units of long-acting insulin  at night and metformin  1000 mg twice daily - Fasting blood sugars generally <140s - Experienced constipation with Rybelsus and did not tolerate Farxiga due to urinary symptoms - Januvia  not tried due to cost- last investigated cost about 1 year ago - Interested in continuous glucose monitor but faces insurance coverage issues - compliant with statin and ACE - Microalbuminuria- established with nephrology  Low back pain and lumbar stenosis - Lumbar stenosis with localized low back pain - Pain worsened by prolonged standing at new job - Received spinal injections in late 2024 with significant relief - No changes in bowel or bladder control - No numbness or tingling in pelvic area - Established with spine specialist  Thyroid  nodules - Last US  Dec 2024 - Reports had had biopsies that were benign - TSH levels have been WNL - Managed by ENT previously - Would like to see a different provider  A. Flutter - S/p ablation in 2023 - compliant with cardizem  - Denies symptoms  Right inguinal pain - Dull achy pain that is intermittent, typically with standing for the last 6 weeks - right inguinal hernia noted in 2024 on CT scan - Feels bulge intermittently     BPH - Established with urology - Well controlled with tamsulosin  daily  Thyroid  US  12/2022:  IMPRESSION:  1. Nodule 1: ACR TI-RADS 5: Recommendation: FNA recommended for further  evaluation.  2.  Nodule 2: ACR TI-RADS 4: Recommendation: Recommend follow-up ultrasound in 1, 2, 3, and 5 years.  3.  Nodule 3: ACR TI-RADS 4: Recommendation: FNA recommended for further characterization.  4.  Nodule 4: ACR TI-RADS 4: Recommendation: No further follow-up ultrasound recommended.     ROS As per HPI   Objective:     BP 99/60   Pulse 75   Temp 98.1 F (36.7 C) (Temporal)   Ht 6' (1.829 m)   Wt 192 lb 3.2 oz (87.2 kg)   SpO2 96%   BMI 26.07 kg/m    Physical Exam Vitals and nursing note reviewed.  Constitutional:      General: He is not in acute distress.    Appearance: He is not ill-appearing, toxic-appearing or diaphoretic.  Neck:     Thyroid : No thyroid  mass, thyromegaly or thyroid  tenderness.  Cardiovascular:     Heart sounds: Normal heart sounds. No murmur heard. Pulmonary:     Effort: Pulmonary effort is normal. No respiratory distress.     Breath sounds: Normal breath sounds. No wheezing, rhonchi or rales.  Abdominal:     General: Bowel sounds are normal. There is no distension.     Palpations: Abdomen is soft.     Tenderness: There is no abdominal tenderness. There is no right CVA tenderness, left CVA tenderness, guarding or rebound.     Hernia: No hernia is present.  Musculoskeletal:     Cervical back: Neck supple.     Right lower leg:  No edema.     Left lower leg: No edema.  Skin:    General: Skin is warm and dry.  Neurological:     General: No focal deficit present.     Mental Status: He is alert and oriented to person, place, and time.  Psychiatric:        Mood and Affect: Mood normal.        Behavior: Behavior normal.      No results found for any visits on 09/02/23.    The ASCVD Risk score (Arnett DK, et al., 2019) failed to calculate for the following reasons:   The valid total cholesterol range is 130 to 320 mg/dL    Assessment & Plan:   Type 2 diabetes mellitus with hyperglycemia, with long-term current use of insulin   (HCC) A1c 8.9, not at goal of <7 but improving. On ACE and statin. Continue basaglar, metformin .  - Prescribed Januvia  100 mg once daily. If cost prohibitive, contact provider for alternatives. - Attempt to obtain coverage for [facility] 3 sensor for continuous glucose monitoring. - Schedule follow-up in 3 months to recheck A1c. -     Bayer DCA Hb A1c Waived -     FreeStyle Libre 3 Plus Sensor; Change sensor every 15 days.  Dispense: 6 each; Refill: 3 -     SITagliptin  Phosphate; Take 1 tablet (100 mg total) by mouth daily.  Dispense: 90 tablet; Refill: 3  Long term current use of oral hypoglycemic drug  Hypertension associated with type 2 diabetes mellitus (HCC) BP well controlled.   Hyperlipidemia associated with type 2 diabetes mellitus (HCC) On statin. Last LDL 52.  Microalbuminuria due to type 2 diabetes mellitus (HCC) On ACE. Established with nephrology. Didn't tolerate SGLT2s  Typical atrial flutter (HCC) RRR today. S/p ablation.   Multiple thyroid  nodules Reviewed US  from 12/2022. Reports had benign biopsies. Last TSH was WNL. Referral to endo for further management.  -     Ambulatory referral to Endocrinology  Spinal stenosis, lumbar region, with neurogenic claudication Lesion of bone of thoracic spine Stable symptoms. Established with spine specialist and ortho. No red flag symptoms. Benign lesion.   BPH with obstruction/lower urinary tract symptoms Established with urology. Well controlled on current regimen.  -     Tamsulosin  HCl; Take 1 capsule (0.4 mg total) by mouth daily.  Dispense: 30 capsule; Refill: 3  Right inguinal hernia No hernia palpated today. CT from 2024 with fat containing inguinal hernia and history is consistent with hernia. Referral to general surgery for further evaluation.  -     Ambulatory referral to General Surgery  Need for vaccination -     Varicella-zoster vaccine IM     Total time spent caring for the patient today was 55 minutes. This  includes time spent before the visit reviewing the chart, time spent during the visit, and time spent after the visit on documentation.  Return in about 3 months (around 12/03/2023) for chronic follow up.   The patient indicates understanding of these issues and agrees with the plan.  Annabella CHRISTELLA Search, FNP

## 2023-09-16 ENCOUNTER — Other Ambulatory Visit: Payer: Self-pay | Admitting: *Deleted

## 2023-09-16 DIAGNOSIS — I152 Hypertension secondary to endocrine disorders: Secondary | ICD-10-CM

## 2023-09-16 DIAGNOSIS — E1169 Type 2 diabetes mellitus with other specified complication: Secondary | ICD-10-CM

## 2023-09-16 DIAGNOSIS — E1129 Type 2 diabetes mellitus with other diabetic kidney complication: Secondary | ICD-10-CM

## 2023-09-16 DIAGNOSIS — Z136 Encounter for screening for cardiovascular disorders: Secondary | ICD-10-CM

## 2023-09-16 MED ORDER — LISINOPRIL 10 MG PO TABS: 5.0000 mg | ORAL_TABLET | Freq: Every day | ORAL | 0 refills | Status: AC

## 2023-09-16 MED ORDER — ATORVASTATIN CALCIUM 10 MG PO TABS: 10.0000 mg | ORAL_TABLET | Freq: Every day | ORAL | 1 refills | Status: AC

## 2023-09-19 ENCOUNTER — Ambulatory Visit (INDEPENDENT_AMBULATORY_CARE_PROVIDER_SITE_OTHER): Admitting: Surgery

## 2023-09-19 ENCOUNTER — Encounter: Payer: Self-pay | Admitting: Surgery

## 2023-09-19 VITALS — BP 109/70 | HR 68 | Temp 97.9°F | Resp 14 | Ht 72.0 in | Wt 192.0 lb

## 2023-09-19 DIAGNOSIS — K4090 Unilateral inguinal hernia, without obstruction or gangrene, not specified as recurrent: Secondary | ICD-10-CM | POA: Diagnosis not present

## 2023-09-23 NOTE — Patient Instructions (Signed)
 Garrett Mcknight  09/23/2023     @PREFPERIOPPHARMACY @   Your procedure is scheduled on  09/26/2023.   Report to [facility] at  0600 A.M.   Call this number if you have problems the morning of surgery:  [phone]  If you experience any cold or flu symptoms such as cough, fever, chills, shortness of breath, etc. between now and your scheduled surgery, please notify us  at the above number.   Remember:        Take 1/2 of your usual insulin  dose the night before your procedure.         DO NOT take any medications for diabetes the morning of your procedure.   Do not eat after midnight.   You may drink clear liquids until 0330 am on 09/26/2023.    Clear liquids allowed are:                    Water , Juice (No red color; non-citric and without pulp; diabetics please choose diet or no sugar options), Carbonated beverages (diabetics please choose diet or no sugar options), Clear Tea (No creamer, milk, or cream, including half & half and powdered creamer), Black Coffee Only (No creamer, milk or cream, including half & half and powdered creamer), and Clear Sports drink (No red color; diabetics please choose diet or no sugar options)    Take these medicines the morning of surgery with A SIP OF WATER                                         diltiazem , tamsulosin .    Do not wear jewelry, make-up or nail polish, including gel polish,  artificial nails, or any other type of covering on natural nails (fingers and  toes).  Do not wear lotions, powders, or perfumes, or deodorant.  Do not shave 48 hours prior to surgery.  Men may shave face and neck.  Do not bring valuables to the hospital.  [facility] is not responsible for any belongings or valuables.  Contacts, dentures or bridgework may not be worn into surgery.  Leave your suitcase in the car.  After surgery it may be brought to your room.  For patients admitted to the hospital, discharge time will be determined by your treatment  team.  Patients discharged the day of surgery will not be allowed to drive home and must have someone with them for 24 hours.    Special instructions:  DO NOT smoke tobacco or vape for 24 hours before your procedure.  Please read over the following fact sheets that you were given. Coughing and Deep Breathing, Surgical Site Infection Prevention, Anesthesia Post-op Instructions, and Care and Recovery After Surgery        Laparoscopic Surgery for Groin Hernia in Adults: What to Know After After a laparoscopic surgery for groin hernia, it's common to have pain, discomfort, soreness, swelling, and bruising around the cuts that were made in the belly. There may also be swelling of the scrotum in males. Follow these instructions at home: Activity Rest as told. Get up and take short walks many times during the day. This helps you breathe better and keeps your blood flowing. Ask for help if you feel weak or unsteady. Ask if it's OK for you to lift. Do not take baths, swim, or use a hot tub until you're told it's OK.  Ask if you can shower. Ask what things are safe for you to do at home. Ask when you can go back to work or school. Medicines Take your medicines only as told. You may need to take steps to help treat or prevent trouble pooping (constipation), such as: Taking medicine to help you poop. Eating foods high in fiber, like beans, whole grains, and fresh fruits and vegetables. Drinking more fluids as told. Ask your health care provider if it's safe to drive or use machines while taking your medicine. Wound care  Take care of the cuts in your belly as told. Make sure you: Wash your hands with soap and water  for at least 20 seconds before and after you change your bandage. If you can't use soap and water , use hand sanitizer. Change your bandage. Leave stitches or skin glue alone. Leave tape strips alone unless you're told to take them off. You may trim the edges of the tape strips if  they curl up. Check the cuts on your belly every day for signs of infection. Check for: More redness, swelling, or pain. More fluid or blood. Warmth. Pus or a bad smell. Pain management  Use ice or an ice pack as told. Place a towel between your skin and the ice. Leave the ice on for 20 minutes, 2-3 times a day. If your skin turns red, take off the ice right away to prevent skin damage. The risk of damage is higher if you can't feel pain, heat, or cold. General instructions Do not smoke, vape, or use nicotine or tobacco. Doing this can slow healing. Wear compression stockings to reduce swelling and help prevent blood clots in your legs. You may be asked to continue to do deep breathing exercises at home. This will help to prevent a lung infection. Your provider may give you more instructions. Make sure you know what you can and can't do. Contact a health care provider if: You have any signs of infection. You have more swelling or pain in your scrotum. You have pain that gets worse or doesn't get better with medicine. You aren't able to pee. You haven't pooped in 3 days. You have a fever. You throw up or you feel like throwing up. Get help right away if: You have redness, warmth, or pain in your leg. You have chest pain. You have trouble breathing. You have very bad pain in your belly. You throw up each time you eat or drink. These symptoms may be an emergency. Call 911 right away. Do not wait to see if the symptoms will go away. Do not drive yourself to the hospital. This information is not intended to replace advice given to you by your health care provider. Make sure you discuss any questions you have with your health care provider. Document Revised: 10/09/2022 Document Reviewed: 10/09/2022 Elsevier Patient Education  2025 Else[employer].General Anesthesia, Adult, Care After The following information offers guidance on how to care for yourself after your procedure. Your health  care provider may also give you more specific instructions. If you have problems or questions, contact your health care provider. What can I expect after the procedure? After the procedure, it is common for people to: Have pain or discomfort at the IV site. Have nausea or vomiting. Have a sore throat or hoarseness. Have trouble concentrating. Feel cold or chills. Feel weak, sleepy, or tired (fatigue). Have soreness and body aches. These can affect parts of the body that were not involved in surgery. Follow  these instructions at home: For the time period you were told by your health care provider:  Rest. Do not participate in activities where you could fall or become injured. Do not drive or use machinery. Do not drink alcohol. Do not take sleeping pills or medicines that cause drowsiness. Do not make important decisions or sign legal documents. Do not take care of children on your own. General instructions Drink enough fluid to keep your urine pale yellow. If you have sleep apnea, surgery and certain medicines can increase your risk for breathing problems. Follow instructions from your health care provider about wearing your sleep device: Anytime you are sleeping, including during daytime naps. While taking prescription pain medicines, sleeping medicines, or medicines that make you drowsy. Return to your normal activities as told by your health care provider. Ask your health care provider what activities are safe for you. Take over-the-counter and prescription medicines only as told by your health care provider. Do not use any products that contain nicotine or tobacco. These products include cigarettes, chewing tobacco, and vaping devices, such as e-cigarettes. These can delay incision healing after surgery. If you need help quitting, ask your health care provider. Contact a health care provider if: You have nausea or vomiting that does not get better with medicine. You vomit every time  you eat or drink. You have pain that does not get better with medicine. You cannot urinate or have bloody urine. You develop a skin rash. You have a fever. Get help right away if: You have trouble breathing. You have chest pain. You vomit blood. These symptoms may be an emergency. Get help right away. Call 911. Do not wait to see if the symptoms will go away. Do not drive yourself to the hospital. Summary After the procedure, it is common to have a sore throat, hoarseness, nausea, vomiting, or to feel weak, sleepy, or fatigue. For the time period you were told by your health care provider, do not drive or use machinery. Get help right away if you have difficulty breathing, have chest pain, or vomit blood. These symptoms may be an emergency. This information is not intended to replace advice given to you by your health care provider. Make sure you discuss any questions you have with your health care provider. Document Revised: 03/24/2021 Document Reviewed: 03/24/2021 Elsevier Patient Education  2024 Else[employer] Inc.How to Use Chlorhexidine  at Home in the Shower Chlorhexidine  gluconate (CHG) is a germ-killing (antiseptic) wash that's used to clean the skin. It can get rid of the germs that normally live on the skin and can keep them away for about 24 hours. If you're having surgery, you may be told to shower with CHG at home the night before surgery. This can help lower your risk for infection. To use CHG wash in the shower, follow the steps below. Supplies needed: CHG body wash. Clean washcloth. Clean towel. How to use CHG in the shower Follow these steps unless you're told to use CHG in a different way: Start the shower. Use your normal soap and shampoo to wash your face and hair. Turn off the shower or move out of the shower stream. Pour CHG onto a clean washcloth. Do not use any type of brush or rough sponge. Start at your neck, washing your body down to your toes. Make sure  you: Wash the part of your body where the surgery will be done for at least 1 minute. Do not scrub. Do not use CHG on your head or  face unless your health care provider tells you to. If it gets into your ears or eyes, rinse them well with water . Do not wash your genitals with CHG. Wash your back and under your arms. Make sure to wash skin folds. Let the CHG sit on your skin for 1-2 minutes or as long as told. Rinse your entire body in the shower, including all body creases and folds. Turn off the shower. Dry off with a clean towel. Do not put anything on your skin afterward, such as powder, lotion, or perfume. Put on clean clothes or pajamas. If it's the night before surgery, sleep in clean sheets. General tips Use CHG only as told, and follow the instructions on the label. Use the full amount of CHG as told. This is often one bottle. Do not smoke and stay away from flames after using CHG. Your skin may feel sticky after using CHG. This is normal. The sticky feeling will go away as the CHG dries. Do not use CHG: If you have a chlorhexidine  allergy or have reacted to chlorhexidine  in the past. On open wounds or areas of skin that have broken skin, cuts, or scrapes. On babies younger than 8 months of age. Contact a health care provider if: You have questions about using CHG. Your skin gets irritated or itchy. You have a rash after using CHG. You swallow any CHG. Call your local poison control center [phone] in the [place] Your eyes itch badly, or they become very red or swollen. Your hearing changes. You have trouble seeing. If you can't reach your provider, go to an urgent care or emergency room. Do not drive yourself. Get help right away if: You have swelling or tingling in your mouth or throat. You make high-pitched whistling sounds when you breathe, most often when you breathe out (wheeze). You have trouble breathing. These symptoms may be an emergency. Call 911 right  away. Do not wait to see if the symptoms will go away. Do not drive yourself to the hospital. This information is not intended to replace advice given to you by your health care provider. Make sure you discuss any questions you have with your health care provider. Document Revised: 07/10/2022 Document Reviewed: 07/06/2021 Elsevier Patient Education  2024 [employer].

## 2023-09-23 NOTE — H&P (Signed)
 [facility] History and Physical  Reason for Referral: Right inguinal hernia Referring Physician: Annabella Search, NP  Chief Complaint   New Patient (Initial Visit)     Garrett Mcknight is a 73 y.o. male.  HPI: Patient presents for evaluation of a right inguinal hernia.  Starting about 3 weeks ago, he began having gradual right lower quadrant abdominal pain.  The pain is noted to be worse with sitting or when he is more upright and improves when he is laying down.  He describes the pain as a 5 out of 10 and would describe it as a zapping type pain.  He first noticed a bulge in the area 3 weeks ago.  He has intermittently been taking tramadol for his pain.  He underwent a CT scan in August 2024 which noted a fat-containing right inguinal hernia.  He is tolerating a diet without significant nausea or vomiting.  He denies fevers and chills.  He is having regular bowel movements.  His past medical history is significant for hypertension, hyperlipidemia, diabetes, and a history of a flutter status post ablation.  He denies use of blood thinning medications.  He denies history of abdominal surgeries.  He denies use of tobacco products, alcohol, and illicit drugs.   Past Medical History:  Diagnosis Date   Arthritis    Diabetes (HCC)    GERD (gastroesophageal reflux disease)    Hyperlipidemia associated with type 2 diabetes mellitus (HCC) 01/16/2021   Hypertension    Seasonal allergies    Sleep apnea    uses CPAP    Past Surgical History:  Procedure Laterality Date   A-FLUTTER ABLATION N/A 05/12/2021   Procedure: A-FLUTTER ABLATION;  Surgeon: Inocencio Soyla Lunger, MD;  Location: [facility];  Service: Cardiovascular;  Laterality: N/A;   fibroma     removed from scalp 35 years ago   L5 lumbar surgery     Left bicep tendoesis     Left knee ACL repaired     Left knee replacement     Left rotary cuff repair     RHINOPHYMA RESECTION     Right knee meniscus     Right  wrist reconstruction     TOTAL KNEE ARTHROPLASTY Right 06/01/2014   Procedure: RIGHT TOTAL KNEE ARTHROPLASTY;  Surgeon: Tanda Heading, MD;  Location: [facility];  Service: Orthopedics;  Laterality: Right;    Family History  Problem Relation Age of Onset   Diabetes Mellitus II Father    Breast cancer Sister    Diabetes Maternal Grandmother    Stroke Maternal Grandfather     Social History   Tobacco Use   Smoking status: Never   Smokeless tobacco: Never  Substance Use Topics   Alcohol use: Yes    Alcohol/week: 0.0 standard drinks of alcohol    Comment: rare   Drug use: No    Medications: I have reviewed the patient's current medications. Allergies as of 09/19/2023       Reactions   Dilaudid [hydromorphone Hcl]    Dry heaves-with IV form   Farxiga [dapagliflozin] Other (See Comments)   Frequent urination   Semaglutide Other (See Comments)   Constipation; Rybelsus        Medication List        Accurate as of September 19, 2023 11:59 PM. If you have any questions, ask your nurse or doctor.          STOP taking these medications    aspirin EC 81 MG tablet  Stopped by: Kevion Fatheree A Naomy Esham   sitaGLIPtin  100 MG tablet Commonly known as: Januvia  Stopped by: June Rode A Taleshia Luff       TAKE these medications    acetaminophen  500 MG tablet Commonly known as: TYLENOL  Take 1,000 mg by mouth every 6 (six) hours as needed for moderate pain.   atorvastatin  10 MG tablet Commonly known as: LIPITOR Take 1 tablet (10 mg total) by mouth daily.   Basaglar KwikPen 100 UNIT/ML Inject 30 Units into the skin at bedtime.   diltiazem  240 MG 24 hr capsule Commonly known as: CARDIZEM  CD Take 1 capsule (240 mg total) by mouth daily.   FreeStyle Libre 3 Plus Sensor Misc Change sensor every 15 days.   lisinopril  10 MG tablet Commonly known as: ZESTRIL  Take 0.5 tablets (5 mg total) by mouth daily.   Melatonin 5 MG Subl Place 5 mg under the tongue at bedtime as needed  (sleep).   metFORMIN  500 MG 24 hr tablet Commonly known as: GLUCOPHAGE -XR Take 2 tablets (1,000 mg total) by mouth 2 (two) times daily.   multivitamin with minerals Tabs tablet Take 1 tablet by mouth daily.   Nervive Nerve Relief Tabs Take 1 tablet by mouth at bedtime.   Systane Ultra 0.4-0.3 % Soln Generic drug: Polyethyl Glycol-Propyl Glycol Place 1 drop into both eyes daily as needed (dry eyes).   tamsulosin  0.4 MG Caps capsule Commonly known as: FLOMAX  Take 1 capsule (0.4 mg total) by mouth daily.   Vitamin D3 125 MCG (5000 UT) Caps Take 5,000 Units by mouth daily.         ROS:  Constitutional: negative for chills, fatigue, and fevers Eyes: negative for visual disturbance and pain Ears, nose, mouth, throat, and face: positive for sinus problems, negative for ear drainage and sore throat Respiratory: negative for cough, wheezing, and shortness of breath Cardiovascular: negative for chest pain and palpitations Gastrointestinal: positive for abdominal pain, negative for nausea, reflux symptoms, and vomiting Genitourinary:negative for dysuria and frequency Integument/breast: positive for dryness and rash Hematologic/lymphatic: negative for bleeding and lymphadenopathy Musculoskeletal:negative for back pain and neck pain Neurological: negative for dizziness and tremors Endocrine: negative for temperature intolerance  Blood pressure 109/70, pulse 68, temperature 97.9 F (36.6 C), temperature source Oral, resp. rate 14, height 6' (1.829 m), weight 192 lb (87.1 kg), SpO2 95%. Physical Exam Vitals reviewed.  Constitutional:      Appearance: Normal appearance.  HENT:     Head: Normocephalic and atraumatic.  Eyes:     Extraocular Movements: Extraocular movements intact.     Pupils: Pupils are equal, round, and reactive to light.  Cardiovascular:     Rate and Rhythm: Normal rate and regular rhythm.  Pulmonary:     Effort: Pulmonary effort is normal.     Breath sounds:  Normal breath sounds.  Abdominal:     Comments: Abdomen soft, nondistended, no percussion tenderness, nontender to palpation; no rigidity, guarding, rebound tenderness; soft and reducible right inguinal hernia  Musculoskeletal:        General: Normal range of motion.     Cervical back: Normal range of motion.  Skin:    General: Skin is warm and dry.  Neurological:     General: No focal deficit present.     Mental Status: He is alert and oriented to person, place, and time.  Psychiatric:        Mood and Affect: Mood normal.        Behavior: Behavior normal.     Results:  CT abdomen and pelvis (09/03/2022): BONES AND SOFT TISSUES: No acute osseous findings. There is multilevel degenerative change of the spine. There is a fat-containing right inguinal hernia.    IMPRESSION: No acute findings. There is left nephrolithiasis.    Assessment & Plan:  Pankaj Haack is a 73 y.o. male who presents for evaluation of a right inguinal hernia.  -I discussed the pathophysiology of inguinal hernias and we discussed the recommendations for surgical repair -The risk and benefits of robotic assisted laparoscopic right inguinal hernia repair with mesh were discussed including but not limited to bleeding, infection, injury to surrounding structures, need for additional procedures, and hernia recurrence.  After careful consideration, Hendryx Ricke has decided to proceed with surgery.  -Patient tentatively scheduled for surgery on 9/18 -Information provided to the patient regarding inguinal hernias -Advised that the patient should present to the ED if they begin to have painful nonreducible bulge in the right groin, nausea, vomiting, and obstipation  All questions were answered to the satisfaction of the patient.   Note: Portions of this report may have been transcribed using voice recognition software. Every effort has been made to ensure accuracy; however, inadvertent computerized transcription  errors may still be present.   Dorothyann Brittle, DO [facility] [address] [place], [place] [address] [phone] (office)

## 2023-09-23 NOTE — Progress Notes (Signed)
 [facility] History and Physical  Reason for Referral: Right inguinal hernia Referring Physician: Annabella Search, NP  Chief Complaint   New Patient (Initial Visit)     Garrett Mcknight is a 73 y.o. male.  HPI: Patient presents for evaluation of a right inguinal hernia.  Starting about 3 weeks ago, he began having gradual right lower quadrant abdominal pain.  The pain is noted to be worse with sitting or when he is more upright and improves when he is laying down.  He describes the pain as a 5 out of 10 and would describe it as a zapping type pain.  He first noticed a bulge in the area 3 weeks ago.  He has intermittently been taking tramadol for his pain.  He underwent a CT scan in August 2024 which noted a fat-containing right inguinal hernia.  He is tolerating a diet without significant nausea or vomiting.  He denies fevers and chills.  He is having regular bowel movements.  His past medical history is significant for hypertension, hyperlipidemia, diabetes, and a history of a flutter status post ablation.  He denies use of blood thinning medications.  He denies history of abdominal surgeries.  He denies use of tobacco products, alcohol, and illicit drugs.   Past Medical History:  Diagnosis Date   Arthritis    Diabetes (HCC)    GERD (gastroesophageal reflux disease)    Hyperlipidemia associated with type 2 diabetes mellitus (HCC) 01/16/2021   Hypertension    Seasonal allergies    Sleep apnea    uses CPAP    Past Surgical History:  Procedure Laterality Date   A-FLUTTER ABLATION N/A 05/12/2021   Procedure: A-FLUTTER ABLATION;  Surgeon: Inocencio Soyla Lunger, MD;  Location: [facility];  Service: Cardiovascular;  Laterality: N/A;   fibroma     removed from scalp 35 years ago   L5 lumbar surgery     Left bicep tendoesis     Left knee ACL repaired     Left knee replacement     Left rotary cuff repair     RHINOPHYMA RESECTION     Right knee meniscus     Right  wrist reconstruction     TOTAL KNEE ARTHROPLASTY Right 06/01/2014   Procedure: RIGHT TOTAL KNEE ARTHROPLASTY;  Surgeon: Tanda Heading, MD;  Location: [facility];  Service: Orthopedics;  Laterality: Right;    Family History  Problem Relation Age of Onset   Diabetes Mellitus II Father    Breast cancer Sister    Diabetes Maternal Grandmother    Stroke Maternal Grandfather     Social History   Tobacco Use   Smoking status: Never   Smokeless tobacco: Never  Substance Use Topics   Alcohol use: Yes    Alcohol/week: 0.0 standard drinks of alcohol    Comment: rare   Drug use: No    Medications: I have reviewed the patient's current medications. Allergies as of 09/19/2023       Reactions   Dilaudid [hydromorphone Hcl]    Dry heaves-with IV form   Farxiga [dapagliflozin] Other (See Comments)   Frequent urination   Semaglutide Other (See Comments)   Constipation; Rybelsus        Medication List        Accurate as of September 19, 2023 11:59 PM. If you have any questions, ask your nurse or doctor.          STOP taking these medications    aspirin EC 81 MG tablet  Stopped by: Kevion Fatheree A Naomy Esham   sitaGLIPtin  100 MG tablet Commonly known as: Januvia  Stopped by: June Rode A Taleshia Luff       TAKE these medications    acetaminophen  500 MG tablet Commonly known as: TYLENOL  Take 1,000 mg by mouth every 6 (six) hours as needed for moderate pain.   atorvastatin  10 MG tablet Commonly known as: LIPITOR Take 1 tablet (10 mg total) by mouth daily.   Basaglar KwikPen 100 UNIT/ML Inject 30 Units into the skin at bedtime.   diltiazem  240 MG 24 hr capsule Commonly known as: CARDIZEM  CD Take 1 capsule (240 mg total) by mouth daily.   FreeStyle Libre 3 Plus Sensor Misc Change sensor every 15 days.   lisinopril  10 MG tablet Commonly known as: ZESTRIL  Take 0.5 tablets (5 mg total) by mouth daily.   Melatonin 5 MG Subl Place 5 mg under the tongue at bedtime as needed  (sleep).   metFORMIN  500 MG 24 hr tablet Commonly known as: GLUCOPHAGE -XR Take 2 tablets (1,000 mg total) by mouth 2 (two) times daily.   multivitamin with minerals Tabs tablet Take 1 tablet by mouth daily.   Nervive Nerve Relief Tabs Take 1 tablet by mouth at bedtime.   Systane Ultra 0.4-0.3 % Soln Generic drug: Polyethyl Glycol-Propyl Glycol Place 1 drop into both eyes daily as needed (dry eyes).   tamsulosin  0.4 MG Caps capsule Commonly known as: FLOMAX  Take 1 capsule (0.4 mg total) by mouth daily.   Vitamin D3 125 MCG (5000 UT) Caps Take 5,000 Units by mouth daily.         ROS:  Constitutional: negative for chills, fatigue, and fevers Eyes: negative for visual disturbance and pain Ears, nose, mouth, throat, and face: positive for sinus problems, negative for ear drainage and sore throat Respiratory: negative for cough, wheezing, and shortness of breath Cardiovascular: negative for chest pain and palpitations Gastrointestinal: positive for abdominal pain, negative for nausea, reflux symptoms, and vomiting Genitourinary:negative for dysuria and frequency Integument/breast: positive for dryness and rash Hematologic/lymphatic: negative for bleeding and lymphadenopathy Musculoskeletal:negative for back pain and neck pain Neurological: negative for dizziness and tremors Endocrine: negative for temperature intolerance  Blood pressure 109/70, pulse 68, temperature 97.9 F (36.6 C), temperature source Oral, resp. rate 14, height 6' (1.829 m), weight 192 lb (87.1 kg), SpO2 95%. Physical Exam Vitals reviewed.  Constitutional:      Appearance: Normal appearance.  HENT:     Head: Normocephalic and atraumatic.  Eyes:     Extraocular Movements: Extraocular movements intact.     Pupils: Pupils are equal, round, and reactive to light.  Cardiovascular:     Rate and Rhythm: Normal rate and regular rhythm.  Pulmonary:     Effort: Pulmonary effort is normal.     Breath sounds:  Normal breath sounds.  Abdominal:     Comments: Abdomen soft, nondistended, no percussion tenderness, nontender to palpation; no rigidity, guarding, rebound tenderness; soft and reducible right inguinal hernia  Musculoskeletal:        General: Normal range of motion.     Cervical back: Normal range of motion.  Skin:    General: Skin is warm and dry.  Neurological:     General: No focal deficit present.     Mental Status: He is alert and oriented to person, place, and time.  Psychiatric:        Mood and Affect: Mood normal.        Behavior: Behavior normal.     Results:  CT abdomen and pelvis (09/03/2022): BONES AND SOFT TISSUES: No acute osseous findings. There is multilevel degenerative change of the spine. There is a fat-containing right inguinal hernia.    IMPRESSION: No acute findings. There is left nephrolithiasis.    Assessment & Plan:  Garrett Mcknight is a 73 y.o. male who presents for evaluation of a right inguinal hernia.  -I discussed the pathophysiology of inguinal hernias and we discussed the recommendations for surgical repair -The risk and benefits of robotic assisted laparoscopic right inguinal hernia repair with mesh were discussed including but not limited to bleeding, infection, injury to surrounding structures, need for additional procedures, and hernia recurrence.  After careful consideration, Garrett Mcknight has decided to proceed with surgery.  -Patient tentatively scheduled for surgery on 9/18 -Information provided to the patient regarding inguinal hernias -Advised that the patient should present to the ED if they begin to have painful nonreducible bulge in the right groin, nausea, vomiting, and obstipation  All questions were answered to the satisfaction of the patient.   Note: Portions of this report may have been transcribed using voice recognition software. Every effort has been made to ensure accuracy; however, inadvertent computerized transcription  errors may still be present.   Dorothyann Brittle, DO [facility] [address] [place], [place] [address] [phone] (office)

## 2023-09-24 ENCOUNTER — Encounter (HOSPITAL_COMMUNITY)
Admission: RE | Admit: 2023-09-24 | Discharge: 2023-09-24 | Disposition: A | Discharge status: Home / Self Care | Source: Ambulatory Visit | Attending: Surgery | Admitting: Surgery

## 2023-09-24 ENCOUNTER — Encounter (HOSPITAL_COMMUNITY): Payer: Self-pay

## 2023-09-24 VITALS — BP 109/70 | HR 68 | Temp 97.9°F | Resp 18 | Ht 72.0 in | Wt 192.0 lb

## 2023-09-24 DIAGNOSIS — E1169 Type 2 diabetes mellitus with other specified complication: Secondary | ICD-10-CM | POA: Insufficient documentation

## 2023-09-24 DIAGNOSIS — Z01818 Encounter for other preprocedural examination: Secondary | ICD-10-CM

## 2023-09-24 DIAGNOSIS — Z01812 Encounter for preprocedural laboratory examination: Secondary | ICD-10-CM | POA: Insufficient documentation

## 2023-09-24 DIAGNOSIS — Z794 Long term (current) use of insulin: Secondary | ICD-10-CM | POA: Diagnosis not present

## 2023-09-24 HISTORY — DX: Nontoxic multinodular goiter: E04.2

## 2023-09-24 HISTORY — DX: Cardiac arrhythmia, unspecified: I49.9

## 2023-09-24 HISTORY — DX: Personal history of urinary calculi: Z87.442

## 2023-09-24 LAB — CBC WITH DIFFERENTIAL/PLATELET
Abs Immature Granulocytes: 0.01 K/uL (ref 0.00–0.07)
Basophils Absolute: 0.1 K/uL (ref 0.0–0.1)
Basophils Relative: 1 %
Eosinophils Absolute: 0.3 K/uL (ref 0.0–0.5)
Eosinophils Relative: 3 %
HCT: 42 % (ref 39.0–52.0)
Hemoglobin: 14.1 g/dL (ref 13.0–17.0)
Immature Granulocytes: 0 %
Lymphocytes Relative: 24 %
Lymphs Abs: 1.8 K/uL (ref 0.7–4.0)
MCH: 28.5 pg (ref 26.0–34.0)
MCHC: 33.6 g/dL (ref 30.0–36.0)
MCV: 85 fL (ref 80.0–100.0)
Monocytes Absolute: 0.6 K/uL (ref 0.1–1.0)
Monocytes Relative: 8 %
Neutro Abs: 5 K/uL (ref 1.7–7.7)
Neutrophils Relative %: 64 %
Platelets: 232 K/uL (ref 150–400)
RBC: 4.94 MIL/uL (ref 4.22–5.81)
RDW: 12.3 % (ref 11.5–15.5)
WBC: 7.8 K/uL (ref 4.0–10.5)
nRBC: 0 % (ref 0.0–0.2)

## 2023-09-24 LAB — BASIC METABOLIC PANEL WITH GFR
Anion gap: 11 (ref 5–15)
BUN: 16 mg/dL (ref 8–23)
CO2: 24 mmol/L (ref 22–32)
Calcium: 9.1 mg/dL (ref 8.9–10.3)
Chloride: 103 mmol/L (ref 98–111)
Creatinine, Ser: 0.67 mg/dL (ref 0.61–1.24)
GFR, Estimated: >60 mL/min (ref >60)
Glucose, Bld: 173 mg/dL — ABNORMAL HIGH (ref 70–99)
Potassium: 4 mmol/L (ref 3.5–5.1)
Sodium: 138 mmol/L (ref 135–145)

## 2023-09-26 ENCOUNTER — Ambulatory Visit (HOSPITAL_COMMUNITY): Admitting: Anesthesiology

## 2023-09-26 ENCOUNTER — Encounter (HOSPITAL_COMMUNITY)
Admission: RE | Disposition: A | Discharge status: Home / Self Care | Payer: Self-pay | Source: Home / Self Care | Attending: Surgery

## 2023-09-26 ENCOUNTER — Encounter (HOSPITAL_COMMUNITY): Payer: Self-pay | Admitting: Surgery

## 2023-09-26 ENCOUNTER — Ambulatory Visit (HOSPITAL_COMMUNITY)
Admission: RE | Admit: 2023-09-26 | Discharge: 2023-09-26 | Disposition: A | Discharge status: Home / Self Care | Attending: Surgery | Admitting: Surgery

## 2023-09-26 ENCOUNTER — Other Ambulatory Visit: Payer: Self-pay

## 2023-09-26 ENCOUNTER — Ambulatory Visit (HOSPITAL_BASED_OUTPATIENT_CLINIC_OR_DEPARTMENT_OTHER): Admitting: Anesthesiology

## 2023-09-26 DIAGNOSIS — K4090 Unilateral inguinal hernia, without obstruction or gangrene, not specified as recurrent: Secondary | ICD-10-CM | POA: Insufficient documentation

## 2023-09-26 DIAGNOSIS — E119 Type 2 diabetes mellitus without complications: Secondary | ICD-10-CM | POA: Diagnosis not present

## 2023-09-26 DIAGNOSIS — E785 Hyperlipidemia, unspecified: Secondary | ICD-10-CM | POA: Insufficient documentation

## 2023-09-26 DIAGNOSIS — Z7984 Long term (current) use of oral hypoglycemic drugs: Secondary | ICD-10-CM | POA: Insufficient documentation

## 2023-09-26 DIAGNOSIS — K219 Gastro-esophageal reflux disease without esophagitis: Secondary | ICD-10-CM | POA: Insufficient documentation

## 2023-09-26 DIAGNOSIS — G4730 Sleep apnea, unspecified: Secondary | ICD-10-CM | POA: Diagnosis not present

## 2023-09-26 DIAGNOSIS — I10 Essential (primary) hypertension: Secondary | ICD-10-CM | POA: Insufficient documentation

## 2023-09-26 DIAGNOSIS — Z794 Long term (current) use of insulin: Secondary | ICD-10-CM | POA: Diagnosis not present

## 2023-09-26 HISTORY — PX: XI ROBOTIC ASSISTED INGUINAL HERNIA REPAIR WITH MESH: SHX6706

## 2023-09-26 LAB — GLUCOSE, CAPILLARY
Glucose-Capillary: 174 mg/dL — ABNORMAL HIGH (ref 70–99)
Glucose-Capillary: 250 mg/dL — ABNORMAL HIGH (ref 70–99)

## 2023-09-26 SURGERY — REPAIR, HERNIA, INGUINAL, ROBOT-ASSISTED, LAPAROSCOPIC, USING MESH
Anesthesia: General | Site: Inguinal | Laterality: Right

## 2023-09-26 MED ORDER — FENTANYL CITRATE (PF) 100 MCG/2ML IJ SOLN
INTRAMUSCULAR | Status: AC
  Filled 2023-09-26: qty 2

## 2023-09-26 MED ORDER — SEVOFLURANE IN SOLN
RESPIRATORY_TRACT | Status: AC
Start: 2023-09-26 — End: 2023-09-26
  Filled 2023-09-26: qty 250

## 2023-09-26 MED ORDER — OXYCODONE HCL 5 MG PO TABS: 5.0000 mg | ORAL_TABLET | Freq: Four times a day (QID) | ORAL | 0 refills | Status: DC | PRN

## 2023-09-26 MED ORDER — PROPOFOL 10 MG/ML IV BOLUS
INTRAVENOUS | Status: DC | PRN
  Administered 2023-09-26: 150 mg via INTRAVENOUS

## 2023-09-26 MED ORDER — LIDOCAINE 2% (20 MG/ML) 5 ML SYRINGE
INTRAMUSCULAR | Status: DC | PRN
  Administered 2023-09-26: 60 mg via INTRAVENOUS

## 2023-09-26 MED ORDER — PROPOFOL 10 MG/ML IV BOLUS
INTRAVENOUS | Status: AC
  Filled 2023-09-26: qty 20

## 2023-09-26 MED ORDER — CHLORHEXIDINE GLUCONATE CLOTH 2 % EX PADS: 6.0000 | MEDICATED_PAD | Freq: Once | CUTANEOUS | Status: DC

## 2023-09-26 MED ORDER — SUGAMMADEX SODIUM 200 MG/2ML IV SOLN
INTRAVENOUS | Status: DC | PRN
  Administered 2023-09-26: 200 mg via INTRAVENOUS

## 2023-09-26 MED ORDER — BUPIVACAINE HCL (PF) 0.5 % IJ SOLN
INTRAMUSCULAR | Status: AC
  Filled 2023-09-26: qty 30

## 2023-09-26 MED ORDER — ONDANSETRON HCL 4 MG/2ML IJ SOLN: 4.0000 mg | Freq: Once | INTRAMUSCULAR | Status: DC | PRN

## 2023-09-26 MED ORDER — PHENYLEPHRINE 80 MCG/ML (10ML) SYRINGE FOR IV PUSH (FOR BLOOD PRESSURE SUPPORT)
PREFILLED_SYRINGE | INTRAVENOUS | Status: AC
  Filled 2023-09-26: qty 10

## 2023-09-26 MED ORDER — ONDANSETRON HCL 4 MG/2ML IJ SOLN
INTRAMUSCULAR | Status: DC | PRN
  Administered 2023-09-26: 4 mg via INTRAVENOUS

## 2023-09-26 MED ORDER — LACTATED RINGERS IV SOLN: INTRAVENOUS | Status: DC

## 2023-09-26 MED ORDER — ESMOLOL HCL 100 MG/10ML IV SOLN
INTRAVENOUS | Status: AC
  Filled 2023-09-26: qty 10

## 2023-09-26 MED ORDER — CEFAZOLIN SODIUM-DEXTROSE 2-4 GM/100ML-% IV SOLN
2.0000 g | INTRAVENOUS | Status: AC
  Administered 2023-09-26: 2 g via INTRAVENOUS

## 2023-09-26 MED ORDER — ORAL CARE MOUTH RINSE: 15.0000 mL | Freq: Once | OROMUCOSAL | Status: DC

## 2023-09-26 MED ORDER — FENTANYL CITRATE (PF) 100 MCG/2ML IJ SOLN
INTRAMUSCULAR | Status: DC | PRN
  Administered 2023-09-26 (×2): 50 ug via INTRAVENOUS

## 2023-09-26 MED ORDER — PHENYLEPHRINE 80 MCG/ML (10ML) SYRINGE FOR IV PUSH (FOR BLOOD PRESSURE SUPPORT)
PREFILLED_SYRINGE | INTRAVENOUS | Status: DC | PRN
  Administered 2023-09-26 (×2): 80 ug via INTRAVENOUS

## 2023-09-26 MED ORDER — ALBUTEROL SULFATE HFA 108 (90 BASE) MCG/ACT IN AERS
INHALATION_SPRAY | RESPIRATORY_TRACT | Status: AC
Start: 2023-09-26 — End: 2023-09-26
  Filled 2023-09-26: qty 13.4

## 2023-09-26 MED ORDER — LIDOCAINE 2% (20 MG/ML) 5 ML SYRINGE
INTRAMUSCULAR | Status: AC
  Filled 2023-09-26: qty 5

## 2023-09-26 MED ORDER — DOCUSATE SODIUM 100 MG PO CAPS
100.0000 mg | ORAL_CAPSULE | Freq: Two times a day (BID) | ORAL | 2 refills | Status: AC
Start: 2023-09-26 — End: 2024-09-25

## 2023-09-26 MED ORDER — STERILE WATER FOR IRRIGATION IR SOLN
Status: DC | PRN
  Administered 2023-09-26: 500 mL

## 2023-09-26 MED ORDER — CHLORHEXIDINE GLUCONATE 0.12 % MT SOLN: 15.0000 mL | Freq: Once | OROMUCOSAL | Status: AC

## 2023-09-26 MED ORDER — CEFAZOLIN SODIUM-DEXTROSE 2-4 GM/100ML-% IV SOLN
INTRAVENOUS | Status: AC
  Filled 2023-09-26: qty 100

## 2023-09-26 MED ORDER — CHLORHEXIDINE GLUCONATE 0.12 % MT SOLN: 15.0000 mL | Freq: Once | OROMUCOSAL | Status: DC

## 2023-09-26 MED ORDER — SUCCINYLCHOLINE CHLORIDE 200 MG/10ML IV SOSY
PREFILLED_SYRINGE | INTRAVENOUS | Status: DC | PRN
  Administered 2023-09-26: 120 mg via INTRAVENOUS

## 2023-09-26 MED ORDER — BUPIVACAINE HCL (PF) 0.5 % IJ SOLN
INTRAMUSCULAR | Status: DC | PRN
  Administered 2023-09-26: 30 mL

## 2023-09-26 MED ORDER — ACETAMINOPHEN 500 MG PO TABS: 1000.0000 mg | ORAL_TABLET | Freq: Four times a day (QID) | ORAL | 0 refills | Status: AC

## 2023-09-26 MED ORDER — ROCURONIUM BROMIDE 10 MG/ML (PF) SYRINGE
PREFILLED_SYRINGE | INTRAVENOUS | Status: AC
  Filled 2023-09-26: qty 10

## 2023-09-26 MED ORDER — DIPHENHYDRAMINE HCL 50 MG/ML IJ SOLN
INTRAMUSCULAR | Status: AC
  Filled 2023-09-26: qty 1

## 2023-09-26 MED ORDER — CHLORHEXIDINE GLUCONATE 0.12 % MT SOLN
OROMUCOSAL | Status: AC
  Administered 2023-09-26: 15 mL via OROMUCOSAL
  Filled 2023-09-26: qty 15

## 2023-09-26 MED ORDER — ROCURONIUM BROMIDE 10 MG/ML (PF) SYRINGE
PREFILLED_SYRINGE | INTRAVENOUS | Status: DC | PRN
Start: 2023-09-26 — End: 2023-09-26
  Administered 2023-09-26: 20 mg via INTRAVENOUS
  Administered 2023-09-26: 60 mg via INTRAVENOUS

## 2023-09-26 MED ORDER — FENTANYL CITRATE PF 50 MCG/ML IJ SOSY
25.0000 ug | PREFILLED_SYRINGE | INTRAMUSCULAR | Status: DC | PRN
  Administered 2023-09-26 (×3): 50 ug via INTRAVENOUS
  Filled 2023-09-26 (×3): qty 1

## 2023-09-26 MED ORDER — OXYCODONE HCL 5 MG PO TABS
5.0000 mg | ORAL_TABLET | Freq: Once | ORAL | Status: AC | PRN
  Administered 2023-09-26: 5 mg via ORAL
  Filled 2023-09-26: qty 1

## 2023-09-26 MED ORDER — ONDANSETRON HCL 4 MG/2ML IJ SOLN
INTRAMUSCULAR | Status: AC
  Filled 2023-09-26: qty 2

## 2023-09-26 MED ORDER — OXYCODONE HCL 5 MG/5ML PO SOLN: 5.0000 mg | Freq: Once | ORAL | Status: AC | PRN

## 2023-09-26 SURGICAL SUPPLY — 42 items
BLADE SURG 15 STRL LF DISP TIS (BLADE) ×1 IMPLANT
CHLORAPREP W/TINT 26 (MISCELLANEOUS) ×1 IMPLANT
COVER LIGHT HANDLE STERIS (MISCELLANEOUS) ×2 IMPLANT
COVER MAYO STAND XLG (MISCELLANEOUS) ×1 IMPLANT
COVER TIP SHEARS 8 DVNC (MISCELLANEOUS) ×1 IMPLANT
DEFOGGER SCOPE WARM SEASHARP (MISCELLANEOUS) ×1 IMPLANT
DERMABOND ADVANCED .7 DNX12 (GAUZE/BANDAGES/DRESSINGS) ×1 IMPLANT
DRAPE ARM DVNC X/XI (DISPOSABLE) ×3 IMPLANT
DRAPE COLUMN DVNC XI (DISPOSABLE) ×1 IMPLANT
DRIVER NDL MEGA SUTCUT DVNCXI (INSTRUMENTS) ×1 IMPLANT
DRIVER NDLE MEGA SUTCUT DVNCXI (INSTRUMENTS) ×1 IMPLANT
ELECTRODE REM PT RTRN 9FT ADLT (ELECTROSURGICAL) ×1 IMPLANT
FORCEPS BPLR R/ABLATION 8 DVNC (INSTRUMENTS) ×1 IMPLANT
GAUZE SPONGE 4X4 12PLY STRL (GAUZE/BANDAGES/DRESSINGS) ×1 IMPLANT
GLOVE BIO SURGEON STRL SZ7 (GLOVE) IMPLANT
GLOVE BIOGEL PI IND STRL 6.5 (GLOVE) ×2 IMPLANT
GLOVE BIOGEL PI IND STRL 7.0 (GLOVE) ×2 IMPLANT
GLOVE SURG SS PI 6.5 STRL IVOR (GLOVE) ×2 IMPLANT
GOWN STRL REUS W/TWL LRG LVL3 (GOWN DISPOSABLE) ×3 IMPLANT
KIT PINK PAD W/HEAD ARM REST (MISCELLANEOUS) ×1 IMPLANT
KIT TURNOVER KIT A (KITS) ×1 IMPLANT
MANIFOLD NEPTUNE II (INSTRUMENTS) ×1 IMPLANT
MESH PROGRIP LAP SLF FIX 16X12 (Mesh General) ×1 IMPLANT
NDL HYPO 21X1 ECLIPSE (NEEDLE) ×1 IMPLANT
NDL INSUFFLATION 14GA 120MM (NEEDLE) ×1 IMPLANT
NEEDLE HYPO 21X1 ECLIPSE (NEEDLE) ×1 IMPLANT
NEEDLE INSUFFLATION 14GA 120MM (NEEDLE) ×1 IMPLANT
OBTURATOR OPTICALSTD 8 DVNC (TROCAR) ×1 IMPLANT
PACK LAP CHOLE LZT030E (CUSTOM PROCEDURE TRAY) ×1 IMPLANT
PENCIL HANDSWITCHING (ELECTRODE) ×1 IMPLANT
SCISSORS MNPLR CVD DVNC XI (INSTRUMENTS) ×1 IMPLANT
SEAL UNIV 5-12 XI (MISCELLANEOUS) ×3 IMPLANT
SET BASIN LINEN APH (SET/KITS/TRAYS/PACK) ×1 IMPLANT
SET TUBE DA VINCI INSUFFLATOR (TUBING) IMPLANT
SOL PREP POV-IOD 4OZ 10% (MISCELLANEOUS) ×1 IMPLANT
SUT MNCRL AB 4-0 PS2 18 (SUTURE) ×1 IMPLANT
SUT STRATA 3-0 SH (SUTURE) ×2 IMPLANT
SUT VIC AB 3-0 SH 27X BRD (SUTURE) IMPLANT
SYR 30ML LL (SYRINGE) ×1 IMPLANT
TAPE TRANSPORE STRL 2 31045 (GAUZE/BANDAGES/DRESSINGS) ×1 IMPLANT
TRAY FOL W/BAG SLVR 16FR STRL (SET/KITS/TRAYS/PACK) ×1 IMPLANT
WATER STERILE IRR 500ML POUR (IV SOLUTION) ×1 IMPLANT

## 2023-09-26 NOTE — Anesthesia Procedure Notes (Signed)
 Procedure Name: Intubation Date/Time: 09/26/2023 7:42 AM  Performed by: Barbarann Verneita RAMAN, CRNAPre-anesthesia Checklist: Patient identified, Emergency Drugs available, Suction available, Patient being monitored and Timeout performed Patient Re-evaluated:Patient Re-evaluated prior to induction Oxygen Delivery Method: Circle system utilized Preoxygenation: Pre-oxygenation with 100% oxygen Induction Type: IV induction and Cricoid Pressure applied Ventilation: Mask ventilation with difficulty and Oral airway inserted - appropriate to patient size Laryngoscope Size: Glidescope and 3 Grade View: Grade I Tube type: Oral Tube size: 7.5 mm Number of attempts: 1 Airway Equipment and Method: Video-laryngoscopy and Stylet Placement Confirmation: ETT inserted through vocal cords under direct vision, positive ETCO2 and breath sounds checked- equal and bilateral Secured at: 23 cm Tube secured with: Tape Dental Injury: Teeth and Oropharynx as per pre-operative assessment

## 2023-09-26 NOTE — Discharge Instructions (Addendum)
 Ambulatory Surgery Discharge Instructions  General Anesthesia or Sedation Do not drive or operate heavy machinery for 24 hours.  Do not consume alcohol, tranquilizers, sleeping medications, or any non-prescribed medications for 24 hours. Do not make important decisions or sign any important papers in the next 24 hours. You should have someone with you tonight at home.  Activity  You are advised to go directly home from the hospital.  Restrict your activities and rest for a day.  Resume light activity tomorrow. No heavy lifting over 10 lbs or strenuous exercise.  Fluids and Diet Begin with clear liquids, bouillon, dry toast, soda crackers.  If not nauseated, you may go to a regular diet when you desire.  Greasy and spicy foods are not advised.  Medications  If you have not had a bowel movement in 24 hours, take 2 tablespoons over the counter Milk of mag.             You May resume your blood thinners tomorrow (Aspirin, coumadin, or other).  You are being discharged with prescriptions for Opioid/Narcotic Medications: There are some specific considerations for these medications that you should know. Opioid Meds have risks & benefits. Addiction to these meds is always a concern with prolonged use Take medication only as directed Do not drive while taking narcotic pain medication Do not crush tablets or capsules Do not use a different container than medication was dispensed in Lock the container of medication in a cool, dry place out of reach of children and pets. Opioid medication can cause addiction Do not share with anyone else (this is a felony) Do not store medications for future use. Dispose of them properly.     Disposal:  Find a [employer]  household drug take back site near you.  If you can't get to a drug take back site, use the recipe below as a last resort to dispose of expired, unused or unwanted drugs. Disposal  (Do not dispose chemotherapy drugs this way, talk to your  prescribing doctor instead.) Step 1: Mix drugs (do not crush) with dirt, kitty litter, or used coffee grounds and add a small amount of water  to dissolve any solid medications. Step 2: Seal drugs in plastic bag. Step 3: Place plastic bag in trash. Step 4: Take prescription container and scratch out personal information, then recycle or throw away.  Operative Site  You have a liquid bandage over your incisions, this will begin to flake off in about a week. Ok to English as a second language teacher. Keep wound clean and dry. No baths or swimming. No lifting more than 10 pounds.  Contact Information: If you have questions or concerns, please call our office, [phone], Monday- Thursday 8AM-5PM and Friday 8AM-12Noon.  If it is after hours or on the weekend, please call [facility] Main Number, [phone], and ask to speak to the surgeon on call for Dr. Evonnie at [facility].   SPECIFIC COMPLICATIONS TO WATCH FOR: Inability to urinate Fever over 101? F by mouth Nausea and vomiting lasting longer than 24 hours. Pain not relieved by medication ordered Swelling around the operative site Increased redness, warmth, hardness, around operative area Numbness, tingling, or cold fingers or toes Blood -soaked dressing, (small amounts of oozing may be normal) Increasing and progressive drainage from surgical area or exam site

## 2023-09-26 NOTE — Op Note (Signed)
 [facility] Operative Note  09/26/23  Preoperative Diagnosis: Right inguinal Hernia   Postoperative Diagnosis: Same   Procedure(s) Performed: Robotic assisted laparoscopic right inguinal hernia repair with mesh   Surgeon: Dorothyann Brittle, DO   Assistants: No qualified resident was available    Anesthesia: General endotracheal   Anesthesiologist: Dr. Kendell   Specimens: None   Estimated Blood Loss: Minimal   Blood Replacement: None    Complications: None   Wound Class:Clean   Operative Indications: The patient has a right inguinal hernia that is symptomatic and they want repaired. We discussed robotic assisted laparoscopic inguinal hernia repair and risk of bleeding, infection, issues with chronic pain post operatively, use of mesh, risk of recurrence, chance of needing to repair a bilateral hernia, risk of injury to bowel or bladder, and risk of injury to cord structures for male patients.   Findings: Vas Deferens and cord structures identified and preserved Progrip Laparoscopic Mesh in place at completion of case Hemostasis achieved   Procedure: The patient was taken to the operating room and placed supine. General endotracheal anesthesia was induced. Intravenous antibiotics were administered per protocol.  A foley catheter was placed and a orogastric tube positioned to decompress the stomach. The abdomen was prepared and draped in the usual sterile fashion.  A time-out was completed verifying correct patient, procedure, site, positioning, and implant(s) and/or special equipment prior to beginning this procedure.  An incision was marked 20 cm above the pubic tubercle, slightly above the umbilicus. Veress needle inserted at the supraumbilical site.  Saline drop test noted to be positive with gradual increase in pressure after initiation of gas insufflation.  15 mm of pressure was achieved prior to removing the Veress needle and then placing a 8 mm port via  the Optiview technique through the supraumbilical site that had been previously marked.  Inspection of the area afterwards noted no injury to the surrounding organs during insertion of the needle and the port.  2 port sites were marked 8 cm to the lateral sides of the initial port, and a 8 mm robotic port was placed on the left side, another 8 mm robotic port on the right side under direct supervision. The [employer] platform was then brought into the operative field and docked to the ports.  Examination of the abdominal cavity noted a right inguinal hernia.  A peritoneal flap was created approximately 8 cm cephalad to the defect by using scissors with electrocautery.  Dissection was carried down towards the pubic tubercle, developing the myopectineal orifice view. Laterally the flap was carried towards the ASIS.  A direct hernia sac was noted, which carefully dissected away from the adjacent tissues to be fully reduced out of hernia cavity.  Any bleeding was controlled with combination of electrocautery and manual pressure.   After confirming adequate dissection and the peritoneal reflection completely down and away from the cord structures, a Progrip laparoscopic mesh was placed within the anterior abdominal wall.  After noting proper placement of the mesh with the peritoneal reflection deep to it, the previously created peritoneal flap was secured back up to the anterior abdominal wall using running 3-0 stratafix. All needles were then removed out of the abdominal cavity, Xi platform undocked from the ports and removed off of operative field.  Marcaine  was instilled at the incision sites.   The abdomen was then desufflated and ports removed. All skin incisions were closed with a subcuticular stitch of Monocryl 4-0. Dermabond was applied. The testis was  gently pulled down into its anatomic position in the scrotum.  Final inspection revealed acceptable hemostasis. All counts were correct at the end of the  case. The patient was awakened from anesthesia and extubated without complication.  The patient went to the PACU in stable condition.   Dorothyann Brittle, DO [facility] [address] [place], [place] [address] [phone] (office)

## 2023-09-26 NOTE — Progress Notes (Signed)
 [facility]  Spoke with the patient's friend, Garrett Mcknight, on the phone.  I explained that he tolerated the procedure without difficulty.  He has dissolvable stitches under the skin with overlying skin glue.  This will flake off in 10 to 14 days.  I discharged him home with a prescription for narcotic pain medication that they should take as needed for pain.  I also want him taking scheduled Tylenol .  If they take the narcotic pain medication, they should take a stool softener as well.  The patient will follow-up with me in 2 weeks.  All questions were answered to his expressed satisfaction.  Dorothyann Brittle, DO [facility] [address] [place], [place] [address] [phone] (office)

## 2023-09-26 NOTE — Interval H&P Note (Signed)
 History and Physical Interval Note:  09/26/2023 7:13 AM  Garrett Mcknight  has presented today for surgery, with the diagnosis of HERNIA, INGUINAL RIGHT.  The various methods of treatment have been discussed with the patient and family. After consideration of risks, benefits and other options for treatment, the patient has consented to  Procedure(s): REPAIR, HERNIA, INGUINAL, ROBOT-ASSISTED, LAPAROSCOPIC, USING MESH (Right) as a surgical intervention.  The patient's history has been reviewed, patient examined, no change in status, stable for surgery.  I have reviewed the patient's chart and labs.  Questions were answered to the patient's satisfaction.     Julius Matus A [name]

## 2023-09-26 NOTE — Anesthesia Preprocedure Evaluation (Signed)
 Anesthesia Evaluation  Patient identified by MRN, date of birth, ID band Patient awake    Reviewed: Allergy & Precautions, H&P , NPO status , Patient's Chart, lab work & pertinent test results, reviewed documented beta blocker date and time   Airway Mallampati: II  TM Distance: >3 FB Neck ROM: full    Dental no notable dental hx.    Pulmonary sleep apnea    Pulmonary exam normal breath sounds clear to auscultation       Cardiovascular Exercise Tolerance: Good hypertension, + dysrhythmias  Rhythm:regular Rate:Normal     Neuro/Psych  Neuromuscular disease  negative psych ROS   GI/Hepatic Neg liver ROS,GERD  ,,  Endo/Other  diabetes    Renal/GU Renal disease  negative genitourinary   Musculoskeletal   Abdominal   Peds  Hematology negative hematology ROS (+)   Anesthesia Other Findings   Reproductive/Obstetrics negative OB ROS                              Anesthesia Physical Anesthesia Plan  ASA: 2  Anesthesia Plan: General and General ETT   Post-op Pain Management:    Induction:   PONV Risk Score and Plan: Ondansetron   Airway Management Planned:   Additional Equipment:   Intra-op Plan:   Post-operative Plan:   Informed Consent: I have reviewed the patients History and Physical, chart, labs and discussed the procedure including the risks, benefits and alternatives for the proposed anesthesia with the patient or authorized representative who has indicated his/her understanding and acceptance.     Dental Advisory Given  Plan Discussed with: CRNA  Anesthesia Plan Comments:         Anesthesia Quick Evaluation

## 2023-09-26 NOTE — Transfer of Care (Signed)
 Immediate Anesthesia Transfer of Care Note  Patient: Garrett Mcknight  Procedure(s) Performed: REPAIR, HERNIA, INGUINAL, ROBOT-ASSISTED, LAPAROSCOPIC, USING MESH (Right: Inguinal)  Patient Location: PACU  Anesthesia Type:General  Level of Consciousness: awake and patient cooperative  Airway & Oxygen Therapy: Patient Spontanous Breathing  Post-op Assessment: Report given to RN and Post -op Vital signs reviewed and stable  Post vital signs: Reviewed and stable  Last Vitals:  Vitals Value Taken Time  BP 138/68 09/26/23 09:32  Temp 97.6 09/26/23  0935  Pulse 78 09/26/23 09:34  Resp 21 09/26/23 09:34  SpO2 98 % 09/26/23 09:34  Vitals shown include unfiled device data.  Last Pain:  Vitals:   09/26/23 0636  TempSrc: Oral  PainSc: 1       Patients Stated Pain Goal: 3 (09/26/23 0636)  Complications: No notable events documented.

## 2023-09-27 ENCOUNTER — Encounter (HOSPITAL_COMMUNITY): Payer: Self-pay | Admitting: Surgery

## 2023-10-01 ENCOUNTER — Telehealth: Payer: Self-pay | Admitting: Family Medicine

## 2023-10-01 ENCOUNTER — Telehealth: Payer: Self-pay | Admitting: *Deleted

## 2023-10-01 ENCOUNTER — Telehealth: Payer: Self-pay | Admitting: Pharmacist

## 2023-10-01 NOTE — Telephone Encounter (Signed)
 Surgical Date: 09/26/2023 Procedure: XI ROBOTIC ASSISTED LAPAROSCOPIC INGUINAL HERNIA REPAIR W/ MESH, RIGHT  Received call from patient [phone]~ telephone.   Patient inquired as to return to work. Advised that hernia repairs typically require 4- 6 weeks for healing.   Patient states that he has not had any further pain or pressure. States that he has no swelling noted.   Patient reports that he works for [employer]. States that he stands 6-8 hours per shift.   Advised that patient would need provider approval prior to returning to work. Advised to follow up at post op appointment on 10/02 as scheduled. Patient requested earlier appointment. Appointment moved to 09/30.

## 2023-10-01 NOTE — Telephone Encounter (Unsigned)
 Copied from CRM [id number]. Topic: Clinical - Prescription Issue >> Oct 01, 2023  3:25 PM Willma R wrote: Reason for CRM: Patient states the pharmacy advised that the patients insurance hasn't approved his prescription for Continuous Glucose Sensor (FREESTYLE LIBRE 3 PLUS SENSOR) MISC. That she is unable to provide it to the patient because she cannot price it and cannot see medicares status on it. Patient is not sure how to get his prescription. Okay to leave VM.  Patient can be reached at [phone]

## 2023-10-01 NOTE — Telephone Encounter (Signed)
 Please complete libre 3 PLUS CGM prior authorization Patient with T2DM and A1c of 8.9% most recently His is on insulin --basaglar 30 units daily Please reach out If you have additional questions

## 2023-10-02 ENCOUNTER — Other Ambulatory Visit (HOSPITAL_COMMUNITY): Payer: Self-pay

## 2023-10-02 ENCOUNTER — Telehealth: Payer: Self-pay

## 2023-10-02 NOTE — Telephone Encounter (Addendum)
 Pharmacy Patient Advocate Encounter   Received notification from Pt Calls Messages that prior authorization for FREESTYLE LIBRE  is required/requested.   Insurance verification completed.   The patient is insured through [employer] .   Per test claim: PA required; PA submitted to above mentioned insurance via Phone Key/confirmation #/EOC [id number] Status is pending   PA submitted for pharmacy benefit (Part D)

## 2023-10-02 NOTE — Telephone Encounter (Signed)
 Pharmacy Patient Advocate Encounter  Received notification from [employer] that Prior Authorization for FREESTYLE LIBRE has been DENIED.  Full denial letter will be uploaded to the media tab. See denial reason below.    PA #/Case ID/Reference #: [id number]

## 2023-10-02 NOTE — Telephone Encounter (Signed)
 PA request has been Submitted. New Encounter has been or will be created for follow up. For additional info see Pharmacy Prior Auth telephone encounter from 10/02/23.

## 2023-10-03 NOTE — Telephone Encounter (Signed)
 Called pharmacy to see if they could bill Part B. Per technician, Garrett Mcknight cannot bill Part B.      Medication is not eligible for pharmacy benefits and must be billed through medical insurance. As our team only handles pharmacy related Mcknight auths, medical PA's must be submitted by the clinic. Thank you

## 2023-10-03 NOTE — Telephone Encounter (Signed)
 Does this mean we just need to send to a different pharmacy that will bill Medicare Part B?

## 2023-10-04 NOTE — Anesthesia Postprocedure Evaluation (Signed)
 Anesthesia Post Note  Patient: Garrett Mcknight  Procedure(s) Performed: REPAIR, HERNIA, INGUINAL, ROBOT-ASSISTED, LAPAROSCOPIC, USING MESH (Right: Inguinal)  Patient location during evaluation: Phase II Anesthesia Type: General Level of consciousness: awake Pain management: pain level controlled Vital Signs Assessment: post-procedure vital signs reviewed and stable Respiratory status: spontaneous breathing and respiratory function stable Cardiovascular status: blood pressure returned to baseline and stable Postop Assessment: no headache and no apparent nausea or vomiting Anesthetic complications: no Comments: Late entry   No notable events documented.   Last Vitals:  Vitals:   09/26/23 1040 09/26/23 1041  BP:  (!) 142/74  Pulse:  78  Resp:  15  Temp: 36.4 C   SpO2:  96%    Last Pain:  Vitals:   09/27/23 1235  TempSrc:   PainSc: 2                  [name]

## 2023-10-08 ENCOUNTER — Ambulatory Visit (INDEPENDENT_AMBULATORY_CARE_PROVIDER_SITE_OTHER): Admitting: Surgery

## 2023-10-08 ENCOUNTER — Encounter: Payer: Self-pay | Admitting: Surgery

## 2023-10-08 VITALS — BP 156/84 | HR 67 | Temp 97.9°F | Resp 16 | Ht 72.0 in | Wt 194.0 lb

## 2023-10-08 DIAGNOSIS — Z09 Encounter for follow-up examination after completed treatment for conditions other than malignant neoplasm: Secondary | ICD-10-CM

## 2023-10-09 ENCOUNTER — Telehealth: Payer: Self-pay

## 2023-10-09 NOTE — Progress Notes (Signed)
 Care Guide Pharmacy Note  10/09/2023 Name: Stanislaw Acton MRN: [id number] DOB: [birth year]  Referred By: Joesph Annabella HERO, FNP Reason for referral: Complex Care Management (Outreach to schedule with Pharm d )   Rhyatt Muska is a 73 y.o. year old male who is a primary care patient of Joesph Annabella HERO, FNP.  Alm Bent was referred to the pharmacist for assistance related to: DMII  An unsuccessful telephone outreach was attempted today to contact the patient who was referred to the pharmacy team for assistance with medication assistance. Additional attempts will be made to contact the patient.  Jeoffrey Buffalo , RMA     [facility]  [facility], [facility] Guide  Direct Dial: [phone]  Website: [email or web address]

## 2023-10-10 ENCOUNTER — Encounter: Admitting: Surgery

## 2023-10-10 ENCOUNTER — Encounter: Payer: Self-pay | Admitting: Family Medicine

## 2023-10-10 ENCOUNTER — Ambulatory Visit: Admitting: Family Medicine

## 2023-10-10 ENCOUNTER — Ambulatory Visit: Payer: Self-pay

## 2023-10-10 VITALS — Temp 98.2°F | Ht 72.0 in | Wt 192.0 lb

## 2023-10-10 DIAGNOSIS — N50812 Left testicular pain: Secondary | ICD-10-CM

## 2023-10-10 DIAGNOSIS — Z23 Encounter for immunization: Secondary | ICD-10-CM

## 2023-10-10 MED ORDER — CIPROFLOXACIN HCL 500 MG PO TABS: 500.0000 mg | ORAL_TABLET | Freq: Two times a day (BID) | ORAL | 0 refills | Status: DC

## 2023-10-10 NOTE — Progress Notes (Unsigned)
 [facility] Note   HPI:  73 y.o. Male presents to clinic for post-op follow-up status post robotic assisted laparoscopic right inguinal hernia repair with mesh on 9/18. ***  Review of Systems:  All other review of systems: otherwise negative   Vital Signs:  BP (!) 156/84   Pulse 67   Temp 97.9 F (36.6 C) (Oral)   Resp 16   Ht 6' (1.829 m)   Wt 194 lb (88 kg)   SpO2 96%   BMI 26.31 kg/m    Physical Exam:  Physical Exam Vitals reviewed.  Constitutional:      Appearance: Normal appearance.  Abdominal:     Comments: Abdomen soft, nondistended, no percussion tenderness, nontender to palpation; no rigidity, guarding, rebound tenderness; laparoscopic incision sites healing well with small amount of skin glue still present, right inguinal region with small amount of swelling and tenderness to palpation, no erythema or ecchymosis, no palpable recurrent hernia  Neurological:     Mental Status: He is alert.     Laboratory studies: None  Imaging:  None  Assessment:  73 y.o. yo Male who presents for follow-up status post robotic assisted laparoscopic right inguinal hernia repair with mesh on 9/18  Plan:  - ***  - *** - *** - Follow up  All of the above recommendations were discussed with the patient, and all of patient's questions were answered to his expressed satisfaction.  Note: Portions of this report may have been transcribed using voice recognition software. Every effort has been made to ensure accuracy; however, inadvertent computerized transcription errors may still be present.   Dorothyann Brittle, DO [facility] [address] [place], [place] [address] [phone] (office)

## 2023-10-10 NOTE — Telephone Encounter (Signed)
Patient being seen today for this issue

## 2023-10-10 NOTE — Telephone Encounter (Signed)
 Patient informed.

## 2023-10-10 NOTE — Progress Notes (Signed)
 Subjective:  Patient ID: Garrett Mcknight, male    DOB: 1950-08-30  Age: 73 y.o. MRN: [id number]  CC: Testicle Pain   HPI  Discussed the use of AI scribe software for clinical note transcription with the patient, who gave verbal consent to proceed.  History of Present Illness Garrett Mcknight is a 73 year old male who presents with left scrotal pain following recent inguinal hernia repair.  He has been experiencing a hot burning sensation on the left side of his scrotum for at least six weeks. The pain is intermittent, more noticeable at night, and exacerbated by pressure, such as when sleeping on his side. There is no visible redness or swelling. He is uncertain if the pain is inside or outside the scrotum.  Two weeks ago, he underwent right inguinal hernia repair, which was initially identified over a year ago during an investigation for a back lesion. He has a history of a significant scrotal injury from a basketball game 15 years ago, which he believes may have resulted in scar tissue.  He has been experiencing constipation, which he attributes to Rybelsus use, and describes severe episodes requiring manual intervention. He started a new job four weeks ago involving twisting and turning, which aggravated his groin pain, prompting him to seek medical attention.  He recalls a low-grade urinary infection diagnosed during a previous visit, and he was referred to a nephrologist due to protein leakage, which was deemed insignificant. He has a history of back pain, which affects his posture while driving, contributing to discomfort in the groin area.  He is currently taking Rybelsus, which he associates with his constipation issues.          10/10/2023   11:36 AM 09/02/2023    9:08 AM 05/30/2023    3:58 PM  Depression screen PHQ 2/9  Decreased Interest 0 0 0  Down, Depressed, Hopeless 0 0 0  PHQ - 2 Score 0 0 0  Altered sleeping  1 0  Tired, decreased energy  1 0  Change in appetite  0  0  Feeling bad or failure about yourself   0 0  Trouble concentrating  0 0  Moving slowly or fidgety/restless  0 0  Suicidal thoughts  0 0  PHQ-9 Score  2 0  Difficult doing work/chores Not difficult at all Not difficult at all Not difficult at all    History Garrett Mcknight has a past medical history of Arthritis, Diabetes (HCC), Dysrhythmia, GERD (gastroesophageal reflux disease), History of kidney stones, Hyperlipidemia associated with type 2 diabetes mellitus (HCC) (01/16/2021), Hypertension, Multiple thyroid  nodules, Seasonal allergies, and Sleep apnea.   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   His family history includes Breast cancer in his sister; Diabetes in his maternal grandmother; Diabetes Mellitus II in his father; Stroke in his maternal grandfather.He reports that he has never smoked. He has never used smokeless tobacco. He reports current alcohol use. He reports that he does not use drugs.    ROS Review of Systems  Objective:  Temp 98.2 F (36.8 C)   Ht 6' (1.829 m)   Wt 192 lb (87.1 kg)   BMI 26.04 kg/m   BP Readings from Last 3 Encounters:  10/08/23 ROLLEN)  156/84  09/26/23 (!) 142/74  09/24/23 109/70    Wt Readings from Last 3 Encounters:  10/10/23 192 lb (87.1 kg)  10/08/23 194 lb (88 kg)  09/26/23 189 lb (85.7 kg)     Physical Exam Physical Exam GENERAL: Alert, cooperative, well developed, no acute distress. HEENT: Normocephalic, normal oropharynx, moist mucous membranes. CHEST: Clear to auscultation bilaterally, no wheezes, rhonchi, or crackles. CARDIOVASCULAR: Normal heart rate and rhythm, S1 and S2 normal  without murmurs. ABDOMEN: Soft, non-tender, non-distended, without organomegaly, normal bowel sounds. GENITOURINARY: Tenderness in the scrotum, right scrotum sensitive to palpation. EXTREMITIES: No cyanosis or edema. NEUROLOGICAL: Cranial nerves grossly intact, moves all extremities without gross motor or sensory deficit.   Assessment & Plan:  Pain in left testicle -     US  SCROTUM W/DOPPLER; Future  Encounter for immunization -     Flu vaccine HIGH DOSE PF(Fluzone Trivalent)  Other orders -     Ciprofloxacin HCl; Take 1 tablet (500 mg total) by mouth 2 (two) times daily. For prostate. Take all of these.  Dispense: 30 tablet; Refill: 0    Assessment and Plan Assessment & Plan Left scrotal pain, possible epididymitis   He experiences intermittent left scrotal pain described as a hot burning sensation, possibly due to epididymitis. The pain is localized to the left side of the scrotum without visible redness or swelling, but tenderness is noted upon examination. There is concern for a chronic smoldering infection in the epididymis, with differential diagnoses including post-surgical changes or infection. Order a scrotal ultrasound to assess circulation, lumps, bumps, and inflammation. Prescribe ciprofloxacin for potential epididymal infection.  Status post right inguinal hernia repair   He is two weeks post right inguinal hernia repair. Post-operative tenderness and sensitivity are expected during recovery, with no signs of complications from the surgery.       Follow-up: No follow-ups on file.  Butler Der, M.D.

## 2023-10-10 NOTE — Telephone Encounter (Signed)
 FYI Only or Action Required?: FYI only for provider.  Patient was last seen in primary care on 09/02/2023 by Joesph Annabella HERO, FNP.  Called Nurse Triage reporting Testicle Pain.  Symptoms began several weeks ago.  Interventions attempted: Nothing.  Symptoms are: gradually worsening.  Triage Disposition: See Physician Within 24 Hours  Patient/caregiver understands and will follow disposition?: Yes       Copied from CRM #[id number]. Topic: Clinical - Red Word Triage >> Oct 10, 2023 10:31 AM Terri MATSU wrote: Red Word that prompted transfer to Nurse Triage: Patient stated he's experiencing burning in his scrotum. Reason for Disposition  [1] Pain comes and goes (intermittent) AND [2] present > 24 hours  Answer Assessment - Initial Assessment Questions Had hernia surgery recently. He states symptoms worsen when laying on his side.   1. LOCATION and RADIATION: Where is the pain located?      L side scrotum area  2. QUALITY: What does the pain feel like?  (e.g., sharp, dull, aching, burning)     Burning sensation on L side  3. SEVERITY: How bad is the pain?  (Scale 1-10; or mild, moderate, severe)   - MILD (1-3): doesn't interfere with normal activities    - MODERATE (4-7): interferes with normal activities (e.g., work or school) or awakens from sleep   - SEVERE (8-10): excruciating pain, unable to do any normal activities, difficulty walking     5-7 out of 10  4. ONSET: When did the pain start?     A week before surgery on 09/18 5. PATTERN: Does it come and go, or has it been constant since it started?     Comes and goes  6. SCROTAL APPEARANCE: What does the scrotum look like? Is there any swelling or redness?      Denies  7. HERNIA: Has a doctor ever told you that you have a hernia?     Yes had surgery on 09/18  Protocols used: Scrotal Pain-A-AH

## 2023-10-15 NOTE — Progress Notes (Signed)
 Care Guide Pharmacy Note  10/15/2023 Name: [name] MRN: [id number] DOB: June 28, 1950  Referred By: Joesph Annabella HERO, FNP Reason for referral: Complex Care Management (Outreach to schedule with Pharm d )   Garrett Mcknight is a 73 y.o. year old male who is a primary care patient of Joesph Annabella HERO, FNP.  Alm Bent was referred to the pharmacist for assistance related to: DMII  Successful contact was made with the patient to discuss pharmacy services including being ready for the pharmacist to call at least 5 minutes before the scheduled appointment time and to have medication bottles and any blood pressure readings ready for review. The patient agreed to meet with the pharmacist via telephone visit on (date/time).10/30/2023  Jeoffrey Buffalo , RMA     [place]  [facility], [facility] Guide  Direct Dial: [phone]  Website: [email or web address]

## 2023-10-21 ENCOUNTER — Ambulatory Visit (HOSPITAL_COMMUNITY): Attending: Family Medicine

## 2023-10-29 ENCOUNTER — Telehealth: Payer: Self-pay | Admitting: Pharmacist

## 2023-10-29 DIAGNOSIS — E1165 Type 2 diabetes mellitus with hyperglycemia: Secondary | ICD-10-CM

## 2023-10-29 NOTE — Telephone Encounter (Signed)
 Submitted libre 3 plus cgm orders to parachute/ADS  Patient stable on basaglar 30 units His FBG ranges from 90-130 Improved control Will place libre sample up front for patient to pick up to try Patient does use smart phone Will schedule f/u for education with PharmD   Mliss Tarry Griffin, PharmD, BCACP, CPP Clinical Pharmacist, [facility]

## 2023-10-30 ENCOUNTER — Ambulatory Visit (HOSPITAL_COMMUNITY)
Admission: RE | Admit: 2023-10-30 | Discharge: 2023-10-30 | Disposition: A | Discharge status: Home / Self Care | Source: Ambulatory Visit | Attending: Family Medicine | Admitting: Family Medicine

## 2023-10-30 ENCOUNTER — Other Ambulatory Visit

## 2023-10-30 DIAGNOSIS — N50812 Left testicular pain: Secondary | ICD-10-CM | POA: Insufficient documentation

## 2023-11-06 ENCOUNTER — Ambulatory Visit: Payer: Self-pay | Admitting: Family Medicine

## 2023-11-06 ENCOUNTER — Encounter: Admitting: Surgery

## 2023-11-13 ENCOUNTER — Ambulatory Visit (INDEPENDENT_AMBULATORY_CARE_PROVIDER_SITE_OTHER): Admitting: Family Medicine

## 2023-11-13 ENCOUNTER — Encounter: Payer: Self-pay | Admitting: Family Medicine

## 2023-11-13 VITALS — BP 102/51 | HR 77 | Temp 98.7°F | Ht 72.0 in | Wt 196.0 lb

## 2023-11-13 DIAGNOSIS — E1165 Type 2 diabetes mellitus with hyperglycemia: Secondary | ICD-10-CM

## 2023-11-13 DIAGNOSIS — Z794 Long term (current) use of insulin: Secondary | ICD-10-CM

## 2023-11-13 DIAGNOSIS — G629 Polyneuropathy, unspecified: Secondary | ICD-10-CM | POA: Diagnosis not present

## 2023-11-13 DIAGNOSIS — J069 Acute upper respiratory infection, unspecified: Secondary | ICD-10-CM

## 2023-11-13 MED ORDER — BENZONATATE 100 MG PO CAPS: 100.0000 mg | ORAL_CAPSULE | Freq: Two times a day (BID) | ORAL | 0 refills | Status: DC | PRN

## 2023-11-13 MED ORDER — LEVOCETIRIZINE DIHYDROCHLORIDE 5 MG PO TABS: 5.0000 mg | ORAL_TABLET | Freq: Every day | ORAL | 1 refills | Status: AC | PRN

## 2023-11-13 MED ORDER — LEVOCETIRIZINE DIHYDROCHLORIDE 5 MG PO TABS
5.0000 mg | ORAL_TABLET | Freq: Every day | ORAL | 1 refills | Status: DC | PRN
Start: 2023-11-13 — End: 2023-11-13

## 2023-11-13 NOTE — Progress Notes (Signed)
 Acute Office Visit  Subjective:     Patient ID: Garrett Mcknight, male    DOB: 01-15-1950, 73 y.o.   MRN: [id number]  Chief Complaint  Patient presents with   right side pain    Cough congestion x 3 days    HPI  History of Present Illness   Garrett Mcknight is a 73 year old male with type 2 diabetes and peripheral neuropathy who presents with right thigh pain and cough.  Right thigh neuropathic pain - Right thigh pain characterized by sensations of 'something crawling', itching, burning, cold, and sharp shooting pains - Pain radiates down the top of the right thigh - Symptoms began months ago following hernia surgery - Uses Nervive cream for relief of neuropathy in his hand, which is more effective than oral medications for him in the past  Peripheral neuropathy and arthralgia - History of peripheral neuropathy - Pain described as arthritic, primarily affecting the joints - Joint pain and stiffness with activities such as turning a bottle cap - Partial relief with Nervive cream  Cough and upper respiratory symptoms - Cough and congestion present for the past 3-4 days - Difficulty expectorating mucus despite use of Mucinex - No fever, but experienced chills approximately one week ago - Extreme fatigue noted with onset of chills - History of bronchitis, but current symptoms do not resemble previous episodes - Uses Nyquil at night; wakes up due to coughing - No shortness of breath, wheezing, or chest pain - No GI symptoms, ear pain, sore throat - Uses nasal spray for congestion, which provides temporary relief  Recent immunizations and exposure - Received recent influenza and COVID-19 vaccines - Recent exposure to coworker with nasal sinus infection  Diabetes management - Received a continuous glucose monitor (CGM) but has not used it due to lack of instructions and concerns about water  exposure during delivery       ROS AS per HPI.      Objective:    BP (!) 102/51    Pulse 77   Temp 98.7 F (37.1 C)   Ht 6' (1.829 m)   Wt 196 lb (88.9 kg)   SpO2 96%   BMI 26.58 kg/m    Physical Exam Vitals and nursing note reviewed.  Constitutional:      General: He is not in acute distress.    Appearance: Normal appearance. He is not ill-appearing, toxic-appearing or diaphoretic.  HENT:     Right Ear: Tympanic membrane, ear canal and external ear normal.     Left Ear: Tympanic membrane, ear canal and external ear normal.     Nose: Congestion present.     Mouth/Throat:     Mouth: Mucous membranes are moist.     Pharynx: Oropharynx is clear. No oropharyngeal exudate or posterior oropharyngeal erythema.     Tonsils: No tonsillar exudate or tonsillar abscesses. 1+ on the right. 1+ on the left.  Eyes:     General:        Right eye: No discharge.        Left eye: No discharge.     Conjunctiva/sclera: Conjunctivae normal.  Cardiovascular:     Rate and Rhythm: Normal rate and regular rhythm.     Pulses: Normal pulses.     Heart sounds: Normal heart sounds. No murmur heard. Pulmonary:     Effort: Pulmonary effort is normal. No respiratory distress.     Breath sounds: Normal breath sounds.  Abdominal:     General: Bowel sounds are normal.  There is no distension.     Palpations: Abdomen is soft. There is no mass.     Tenderness: There is no abdominal tenderness. There is no guarding or rebound.  Musculoskeletal:     Cervical back: Neck supple. No tenderness.     Right lower leg: No edema.     Left lower leg: No edema.  Lymphadenopathy:     Cervical: No cervical adenopathy.  Skin:    General: Skin is warm and dry.  Neurological:     General: No focal deficit present.     Mental Status: He is alert and oriented to person, place, and time.  Psychiatric:        Mood and Affect: Mood normal.        Behavior: Behavior normal.     No results found for any visits on 11/13/23.      Assessment & Plan:   Mohammad was seen today for right side  pain.  Diagnoses and all orders for this visit:  Acute URI -     Discontinue: benzonatate (TESSALON) 100 MG capsule; Take 1 capsule (100 mg total) by mouth 2 (two) times daily as needed for cough. -     Discontinue: levocetirizine (XYZAL) 5 MG tablet; Take 1 tablet (5 mg total) by mouth daily as needed (cough, congestion, runny nose). -     benzonatate (TESSALON) 100 MG capsule; Take 1 capsule (100 mg total) by mouth 2 (two) times daily as needed for cough. -     levocetirizine (XYZAL) 5 MG tablet; Take 1 tablet (5 mg total) by mouth daily as needed (cough, congestion, runny nose).  Neuropathy  Type 2 diabetes mellitus with hyperglycemia, with long-term current use of insulin  (HCC)  Assessment and Plan    Acute upper respiratory infection Likely viral etiology. Symptoms do not suggest bronchitis. - Prescribed Tessalon Perles for cough. - Prescribed Xyzal for congestion. - Continue symptomatic care.  - Follow up for new or worsening symptoms  Neuropathy Possibly related to previous hernia surgery or chronic back issues. - Continue using Nervive cream for symptom relief.  Type 2 diabetes mellitus Uncontrolled with last A1c 8.9%. Discussed potential benefits of Ozempic for diabetes management and weight loss. - Referred to triage nurse for assistance with CGM setup. - Schedule diabetic eye exam.      Keep scheduled chronic follow up appt.   Annabella CHRISTELLA Search, FNP

## 2023-11-27 ENCOUNTER — Ambulatory Visit

## 2023-12-18 ENCOUNTER — Encounter: Payer: Self-pay | Admitting: Family Medicine

## 2023-12-18 ENCOUNTER — Ambulatory Visit: Payer: Self-pay | Admitting: Family Medicine

## 2023-12-18 VITALS — BP 132/79 | HR 71 | Temp 98.6°F | Ht 72.0 in | Wt 188.0 lb

## 2023-12-18 DIAGNOSIS — G629 Polyneuropathy, unspecified: Secondary | ICD-10-CM

## 2023-12-18 DIAGNOSIS — E042 Nontoxic multinodular goiter: Secondary | ICD-10-CM

## 2023-12-18 DIAGNOSIS — M2550 Pain in unspecified joint: Secondary | ICD-10-CM

## 2023-12-18 DIAGNOSIS — L603 Nail dystrophy: Secondary | ICD-10-CM

## 2023-12-18 DIAGNOSIS — E1169 Type 2 diabetes mellitus with other specified complication: Secondary | ICD-10-CM

## 2023-12-18 DIAGNOSIS — E1165 Type 2 diabetes mellitus with hyperglycemia: Secondary | ICD-10-CM

## 2023-12-18 DIAGNOSIS — Z8679 Personal history of other diseases of the circulatory system: Secondary | ICD-10-CM

## 2023-12-18 DIAGNOSIS — R600 Localized edema: Secondary | ICD-10-CM

## 2023-12-18 DIAGNOSIS — I152 Hypertension secondary to endocrine disorders: Secondary | ICD-10-CM

## 2023-12-18 DIAGNOSIS — E1129 Type 2 diabetes mellitus with other diabetic kidney complication: Secondary | ICD-10-CM

## 2023-12-18 DIAGNOSIS — I4892 Unspecified atrial flutter: Secondary | ICD-10-CM

## 2023-12-18 LAB — BAYER DCA HB A1C WAIVED: HB A1C (BAYER DCA - WAIVED): 8.6 % — ABNORMAL HIGH (ref 4.8–5.6)

## 2023-12-18 NOTE — Progress Notes (Signed)
 Established Patient Office Visit  Subjective   Patient ID: Garrett Mcknight, male    DOB: January 19, 1950  Age: 73 y.o. MRN: [id number]  Chief Complaint  Patient presents with   Medical Management of Chronic Issues    HPI  History of Present Illness   Garrett Mcknight is a 73 year old male with type 2 diabetes who presents for follow-up on his diabetes management.  Glycemic control and medication adherence - Type 2 diabetes mellitus with current A1c of 8.6%. - Inconsistent use of insulin  and metformin , particularly after recent travel to [place]  for Thanksgiving. - Januvia  was too expensive so this was never filled - Difficulty sleeping, suspected to be related to nighttime medication use; improved sleep when skipping nighttime medications. - Efforts to maintain hydration.  Peripheral neuropathy and musculoskeletal pain - Pain in toes, especially at the joints. Worse at night. Pain increases when bed sheet touches his toes - Peripheral neuropathy with numbness in toes. - Joint pain in hands and toes, worse in the morning and improves throughout the day. - Stiffness in fingers in the morning - Reduced mobility in hands, making toenail trimming difficult. - Denies swelling of joints. Toes sometimes look red. - He is concerned about gout vs autoimmune etiology for pain  Edema and venous insufficiency - Swelling in legs during the day, previously associated with prolonged standing at work. - No morning swelling in feet. - Considering use of support socks for management of swelling.  Dermatologic changes - Brittle nails with ridges. - No improvement with biotin supplementation.  Cardiac - Denies palpitations, chest pain, shortness of breath, orthopnea - Occasional dizziness when bending over - BP well controlled at home        ROS As per HPI.    Objective:     BP 132/79   Pulse 71   Temp 98.6 F (37 C) (Temporal)   Ht 6' (1.829 m)   Wt 188 lb (85.3 kg)   SpO2 99%   BMI  25.50 kg/m  Wt Readings from Last 3 Encounters:  12/18/23 188 lb (85.3 kg)  11/13/23 196 lb (88.9 kg)  10/10/23 192 lb (87.1 kg)      Physical Exam Vitals and nursing note reviewed.  Constitutional:      General: He is not in acute distress.    Appearance: He is not ill-appearing, toxic-appearing or diaphoretic.  Neck:     Thyroid : No thyroid  mass, thyromegaly or thyroid  tenderness.  Cardiovascular:     Heart sounds: Normal heart sounds. No murmur heard. Pulmonary:     Effort: Pulmonary effort is normal. No respiratory distress.     Breath sounds: Normal breath sounds. No wheezing, rhonchi or rales.  Musculoskeletal:     Right wrist: No swelling or tenderness.     Left wrist: No swelling or tenderness.     Right hand: No swelling.     Left hand: No swelling.     Cervical back: Neck supple.     Right lower leg: No edema.     Left lower leg: No edema.  Skin:    General: Skin is warm and dry.  Neurological:     General: No focal deficit present.     Mental Status: He is alert and oriented to person, place, and time.  Psychiatric:        Mood and Affect: Mood normal.        Behavior: Behavior normal.      No results found for any visits on  12/18/23.    The ASCVD Risk score (Arnett DK, et al., 2019) failed to calculate for the following reasons:   The valid total cholesterol range is 130 to 320 mg/dL    Assessment & Plan:   Kaelon was seen today for medical management of chronic issues.  Diagnoses and all orders for this visit:  Type 2 diabetes mellitus with hyperglycemia, with long-term current use of insulin  (HCC) -     Bayer DCA Hb A1c Waived  Hypertension associated with diabetes (HCC)  Hypertension associated with type 2 diabetes mellitus (HCC)  Hyperlipidemia associated with type 2 diabetes mellitus (HCC)  Microalbuminuria due to type 2 diabetes mellitus (HCC)  Atrial flutter, unspecified type (HCC)  S/P ablation of atrial flutter  Peripheral  edema  Arthralgia of multiple joints -     ANA w/Reflex -     Uric Acid -     C-reactive protein -     Sedimentation Rate -     CBC with Differential/Platelet  Peripheral polyneuropathy  Brittle nails -     TSH  Multiple thyroid  nodules -     TSH   Assessment and Plan    Type 2 diabetes mellitus A1c at 8.6 indicates poor control. Admits to poor compliance with regimen lately - Encouraged consistent insulin  and metformin  use. - Recheck A1c in 3 months. - Wear CGM   HTN BP at goal  HLD Well controlled with statin  Microalbuminuria - On ACE - Unable to afford SGLT2s.  Peripheral polyneuropathy Discussed that pain at night likely due at least in part to peripheral neuropathy - Recommended medium support socks. - Advised on compression sock use.  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 Will check labs as below for potential autoimmune vs gout etiology.  - Ordered ANA panel and inflammatory markers. - Ordered uric acid level. - Consider rheumatology referral if labs indicate.  Brittle nails No significant brittleness noted on exam today - Will check TSH  Nontoxic multinodular goiter - Ordered thyroid  function tests. - Declined endocrinology referral.     Return in about 3 months (around 03/17/2024) for chronic follow up.  The patient indicates understanding of these issues and agrees with the plan.   Garrett Mcknight Search, FNP

## 2023-12-20 ENCOUNTER — Ambulatory Visit: Payer: Self-pay | Admitting: Family Medicine

## 2023-12-20 ENCOUNTER — Telehealth: Payer: Self-pay | Admitting: *Deleted

## 2023-12-20 ENCOUNTER — Ambulatory Visit: Admitting: *Deleted

## 2023-12-20 DIAGNOSIS — E1165 Type 2 diabetes mellitus with hyperglycemia: Secondary | ICD-10-CM

## 2023-12-20 DIAGNOSIS — M2550 Pain in unspecified joint: Secondary | ICD-10-CM

## 2023-12-20 DIAGNOSIS — R7689 Other specified abnormal immunological findings in serum: Secondary | ICD-10-CM

## 2023-12-20 LAB — ENA+DNA/DS+ANTICH+CENTRO+FA...
Anti JO-1: <0.2 AI (ref 0.0–0.9)
Antiribosomal P Antibodies: <0.2 AI (ref 0.0–0.9)
Centromere Ab Screen: <0.2 AI (ref 0.0–0.9)
Chromatin Ab SerPl-aCnc: <0.2 AI (ref 0.0–0.9)
ENA RNP Ab: <0.2 AI (ref 0.0–0.9)
ENA SM Ab Ser-aCnc: <0.2 AI (ref 0.0–0.9)
ENA SSA (RO) Ab: <0.2 AI (ref 0.0–0.9)
ENA SSB (LA) Ab: <0.2 AI (ref 0.0–0.9)
Scleroderma (Scl-70) (ENA) Antibody, IgG: <0.2 AI (ref 0.0–0.9)
Smith/RNP Antibodies: <0.2 AI (ref 0.0–0.9)
Speckled Pattern: 1:160 {titer} — ABNORMAL HIGH
dsDNA Ab: 1 [IU]/mL (ref 0–9)

## 2023-12-20 LAB — CBC WITH DIFFERENTIAL/PLATELET
Basophils Absolute: 0.1 x10E3/uL (ref 0.0–0.2)
Basos: 1 %
EOS (ABSOLUTE): 0.4 x10E3/uL (ref 0.0–0.4)
Eos: 6 %
Hematocrit: 40.8 % (ref 37.5–51.0)
Hemoglobin: 14.2 g/dL (ref 13.0–17.7)
Immature Grans (Abs): 0 x10E3/uL (ref 0.0–0.1)
Immature Granulocytes: 0 %
Lymphocytes Absolute: 1.8 x10E3/uL (ref 0.7–3.1)
Lymphs: 30 %
MCH: 29.8 pg (ref 26.6–33.0)
MCHC: 34.8 g/dL (ref 31.5–35.7)
MCV: 86 fL (ref 79–97)
Monocytes Absolute: 0.6 x10E3/uL (ref 0.1–0.9)
Monocytes: 10 %
Neutrophils Absolute: 3.1 x10E3/uL (ref 1.4–7.0)
Neutrophils: 53 %
Platelets: 228 x10E3/uL (ref 150–450)
RBC: 4.76 x10E6/uL (ref 4.14–5.80)
RDW: 12.6 % (ref 11.6–15.4)
WBC: 5.9 x10E3/uL (ref 3.4–10.8)

## 2023-12-20 LAB — SEDIMENTATION RATE: Sed Rate: 2 mm/h (ref 0–30)

## 2023-12-20 LAB — URIC ACID: Uric Acid: 3 mg/dL — ABNORMAL LOW (ref 3.8–8.4)

## 2023-12-20 LAB — ANA W/REFLEX: ANA Titer 1: POSITIVE — AB

## 2023-12-20 LAB — C-REACTIVE PROTEIN: CRP: <1 mg/L (ref 0–10)

## 2023-12-20 LAB — TSH: TSH: 0.814 u[IU]/mL (ref 0.450–4.500)

## 2023-12-20 NOTE — Telephone Encounter (Signed)
 error

## 2023-12-20 NOTE — Progress Notes (Signed)
 Patient seen in triage for a libre change. Patient tolerated well.

## 2023-12-20 NOTE — Telephone Encounter (Signed)
 Referral placed.

## 2023-12-26 ENCOUNTER — Other Ambulatory Visit (HOSPITAL_COMMUNITY): Payer: Self-pay

## 2023-12-26 ENCOUNTER — Telehealth: Payer: Self-pay

## 2023-12-26 NOTE — Telephone Encounter (Signed)
 Patient coming to office to sign renewal application for [employer].

## 2023-12-26 NOTE — Telephone Encounter (Signed)
 Patient completed application.   Provider portion left with Mliss SQUIBB.

## 2024-01-21 NOTE — Telephone Encounter (Signed)
 Application completed.  PAP: Application for [employer] has been submitted to [employer], via fax.  Please send new RX for Basaglar with refills to [facility] for patients re-enrollment. Thanks!

## 2024-01-27 ENCOUNTER — Telehealth: Payer: Self-pay | Admitting: Pharmacist

## 2024-01-27 DIAGNOSIS — E119 Type 2 diabetes mellitus without complications: Secondary | ICD-10-CM

## 2024-01-27 MED ORDER — BASAGLAR KWIKPEN 100 UNIT/ML ~~LOC~~ SOPN: 30.0000 [IU] | PEN_INJECTOR | Freq: Every day | SUBCUTANEOUS | 4 refills | Status: AC

## 2024-01-27 NOTE — Telephone Encounter (Signed)
" ° °  Patient enrolled in the [employer] patient assistance program for Basaglar .  Updated RX escribed to [facility] order (pharmacy for [employer] patient assistance).  Patient is stable on current regimen.    Garrett Mcknight Garrett Mcknight, PharmD, BCACP, CPP Clinical Pharmacist, [facility]  "

## 2024-01-28 NOTE — Telephone Encounter (Signed)
 PAP: Patient assistance -ENROLLMENT application for Basaglar  has been approved by [employer] Companies: [employer] from 01/22/24 to 01/07/25.   Medication should be delivered to PAP Delivery: Home.   For further shipping updates, please contact [employer] at [phone].   Patient ID is: [id number]

## 2024-02-12 ENCOUNTER — Ambulatory Visit (INDEPENDENT_AMBULATORY_CARE_PROVIDER_SITE_OTHER): Admitting: *Deleted

## 2024-02-12 DIAGNOSIS — E1169 Type 2 diabetes mellitus with other specified complication: Secondary | ICD-10-CM

## 2024-02-12 NOTE — Progress Notes (Signed)
 Patient walked in for a libre change. Patient tolerated well.

## 2024-04-02 ENCOUNTER — Ambulatory Visit: Admitting: Rheumatology

## 2024-05-18 ENCOUNTER — Ambulatory Visit: Admitting: Rheumatology
